# Patient Record
Sex: Female | Born: 1960 | Race: White | Hispanic: No | Marital: Married | Smoking: Former smoker
Health system: Southern US, Community
[De-identification: ages and names within clinical notes are randomized; demographics above are authoritative.]

## PROBLEM LIST (undated history)

## (undated) DIAGNOSIS — E785 Hyperlipidemia, unspecified: Secondary | ICD-10-CM

## (undated) DIAGNOSIS — E559 Vitamin D deficiency, unspecified: Secondary | ICD-10-CM

## (undated) DIAGNOSIS — G4730 Sleep apnea, unspecified: Secondary | ICD-10-CM

## (undated) DIAGNOSIS — E039 Hypothyroidism, unspecified: Secondary | ICD-10-CM

## (undated) DIAGNOSIS — G4733 Obstructive sleep apnea (adult) (pediatric): Secondary | ICD-10-CM

## (undated) DIAGNOSIS — K219 Gastro-esophageal reflux disease without esophagitis: Secondary | ICD-10-CM

## (undated) DIAGNOSIS — F32A Depression, unspecified: Secondary | ICD-10-CM

## (undated) DIAGNOSIS — G4700 Insomnia, unspecified: Secondary | ICD-10-CM

## (undated) DIAGNOSIS — Z9889 Other specified postprocedural states: Secondary | ICD-10-CM

## (undated) DIAGNOSIS — M797 Fibromyalgia: Secondary | ICD-10-CM

## (undated) DIAGNOSIS — T8859XA Other complications of anesthesia, initial encounter: Secondary | ICD-10-CM

## (undated) DIAGNOSIS — J45909 Unspecified asthma, uncomplicated: Secondary | ICD-10-CM

## (undated) DIAGNOSIS — E079 Disorder of thyroid, unspecified: Secondary | ICD-10-CM

## (undated) DIAGNOSIS — I341 Nonrheumatic mitral (valve) prolapse: Secondary | ICD-10-CM

## (undated) DIAGNOSIS — Z8489 Family history of other specified conditions: Secondary | ICD-10-CM

## (undated) DIAGNOSIS — T884XXA Failed or difficult intubation, initial encounter: Secondary | ICD-10-CM

## (undated) DIAGNOSIS — R0902 Hypoxemia: Secondary | ICD-10-CM

## (undated) DIAGNOSIS — R112 Nausea with vomiting, unspecified: Secondary | ICD-10-CM

## (undated) DIAGNOSIS — T4145XA Adverse effect of unspecified anesthetic, initial encounter: Secondary | ICD-10-CM

## (undated) DIAGNOSIS — M1990 Unspecified osteoarthritis, unspecified site: Secondary | ICD-10-CM

## (undated) DIAGNOSIS — G43909 Migraine, unspecified, not intractable, without status migrainosus: Secondary | ICD-10-CM

## (undated) DIAGNOSIS — I10 Essential (primary) hypertension: Secondary | ICD-10-CM

## (undated) DIAGNOSIS — F419 Anxiety disorder, unspecified: Secondary | ICD-10-CM

## (undated) HISTORY — DX: Essential (primary) hypertension: I10

## (undated) HISTORY — PX: TMJ ARTHROPLASTY: SHX1066

## (undated) HISTORY — PX: CHOLECYSTECTOMY: SHX55

## (undated) HISTORY — DX: Nonrheumatic mitral (valve) prolapse: I34.1

## (undated) HISTORY — PX: ABDOMINAL HYSTERECTOMY: SHX81

## (undated) HISTORY — PX: CARPAL TUNNEL RELEASE: SHX101

## (undated) HISTORY — DX: Unspecified asthma, uncomplicated: J45.909

## (undated) HISTORY — DX: Disorder of thyroid, unspecified: E07.9

## (undated) HISTORY — DX: Insomnia, unspecified: G47.00

## (undated) HISTORY — DX: Vitamin D deficiency, unspecified: E55.9

## (undated) HISTORY — DX: Unspecified osteoarthritis, unspecified site: M19.90

## (undated) HISTORY — DX: Depression, unspecified: F32.A

## (undated) HISTORY — PX: RECTOCELE REPAIR: SHX761

## (undated) HISTORY — DX: Migraine, unspecified, not intractable, without status migrainosus: G43.909

## (undated) HISTORY — DX: Obstructive sleep apnea (adult) (pediatric): G47.33

## (undated) HISTORY — DX: Hypoxemia: R09.02

## (undated) HISTORY — PX: TUBAL LIGATION: SHX77

## (undated) HISTORY — DX: Sleep apnea, unspecified: G47.30

## (undated) HISTORY — PX: VESICOVAGINAL FISTULA CLOSURE W/ TAH: SUR271

## (undated) HISTORY — DX: Fibromyalgia: M79.7

## (undated) HISTORY — DX: Anxiety disorder, unspecified: F41.9

## (undated) HISTORY — PX: NASAL SEPTUM SURGERY: SHX37

## (undated) HISTORY — DX: Hyperlipidemia, unspecified: E78.5

## (undated) HISTORY — PX: HEMORRHOIDECTOMY WITH HEMORRHOID BANDING: SHX5633

## (undated) HISTORY — DX: Failed or difficult intubation, initial encounter: T88.4XXA

---

## 1997-12-25 ENCOUNTER — Ambulatory Visit (HOSPITAL_COMMUNITY): Admission: RE | Admit: 1997-12-25 | Discharge: 1997-12-25 | Payer: Self-pay | Admitting: Obstetrics & Gynecology

## 1998-10-21 ENCOUNTER — Other Ambulatory Visit: Admission: RE | Admit: 1998-10-21 | Discharge: 1998-10-21 | Payer: Self-pay | Admitting: Obstetrics & Gynecology

## 1998-11-08 ENCOUNTER — Ambulatory Visit (HOSPITAL_COMMUNITY): Admission: RE | Admit: 1998-11-08 | Discharge: 1998-11-08 | Payer: Self-pay | Admitting: Obstetrics & Gynecology

## 1999-01-10 ENCOUNTER — Ambulatory Visit (HOSPITAL_COMMUNITY): Admission: RE | Admit: 1999-01-10 | Discharge: 1999-01-10 | Payer: Self-pay | Admitting: Obstetrics & Gynecology

## 1999-01-10 ENCOUNTER — Encounter: Payer: Self-pay | Admitting: Obstetrics & Gynecology

## 1999-12-23 ENCOUNTER — Other Ambulatory Visit: Admission: RE | Admit: 1999-12-23 | Discharge: 1999-12-23 | Payer: Self-pay | Admitting: Obstetrics & Gynecology

## 2000-01-12 ENCOUNTER — Other Ambulatory Visit: Admission: RE | Admit: 2000-01-12 | Discharge: 2000-01-12 | Payer: Self-pay | Admitting: Obstetrics & Gynecology

## 2000-05-20 ENCOUNTER — Encounter (INDEPENDENT_AMBULATORY_CARE_PROVIDER_SITE_OTHER): Payer: Self-pay

## 2000-05-20 ENCOUNTER — Inpatient Hospital Stay (HOSPITAL_COMMUNITY): Admission: RE | Admit: 2000-05-20 | Discharge: 2000-05-22 | Payer: Self-pay | Admitting: Obstetrics & Gynecology

## 2000-11-03 ENCOUNTER — Encounter: Payer: Self-pay | Admitting: Family Medicine

## 2000-11-03 ENCOUNTER — Ambulatory Visit (HOSPITAL_COMMUNITY): Admission: RE | Admit: 2000-11-03 | Discharge: 2000-11-03 | Payer: Self-pay | Admitting: Family Medicine

## 2001-08-23 ENCOUNTER — Encounter: Payer: Self-pay | Admitting: Emergency Medicine

## 2001-08-23 ENCOUNTER — Emergency Department (HOSPITAL_COMMUNITY): Admission: AC | Admit: 2001-08-23 | Discharge: 2001-08-23 | Payer: Self-pay

## 2001-12-30 ENCOUNTER — Ambulatory Visit (HOSPITAL_BASED_OUTPATIENT_CLINIC_OR_DEPARTMENT_OTHER): Admission: RE | Admit: 2001-12-30 | Discharge: 2001-12-30 | Payer: Self-pay | Admitting: Family Medicine

## 2002-10-03 ENCOUNTER — Encounter: Admission: RE | Admit: 2002-10-03 | Discharge: 2002-10-03 | Payer: Self-pay | Admitting: Gastroenterology

## 2002-10-03 ENCOUNTER — Encounter: Payer: Self-pay | Admitting: Gastroenterology

## 2002-11-13 ENCOUNTER — Ambulatory Visit (HOSPITAL_COMMUNITY): Admission: RE | Admit: 2002-11-13 | Discharge: 2002-11-13 | Payer: Self-pay | Admitting: Gastroenterology

## 2002-11-13 ENCOUNTER — Encounter: Payer: Self-pay | Admitting: Gastroenterology

## 2002-12-20 ENCOUNTER — Ambulatory Visit (HOSPITAL_COMMUNITY): Admission: RE | Admit: 2002-12-20 | Discharge: 2002-12-20 | Payer: Self-pay | Admitting: Gastroenterology

## 2003-05-21 ENCOUNTER — Other Ambulatory Visit: Admission: RE | Admit: 2003-05-21 | Discharge: 2003-05-21 | Payer: Self-pay | Admitting: Obstetrics and Gynecology

## 2004-10-13 ENCOUNTER — Other Ambulatory Visit: Admission: RE | Admit: 2004-10-13 | Discharge: 2004-10-13 | Payer: Self-pay | Admitting: Family Medicine

## 2005-05-01 ENCOUNTER — Observation Stay (HOSPITAL_COMMUNITY): Admission: RE | Admit: 2005-05-01 | Discharge: 2005-05-02 | Payer: Self-pay | Admitting: *Deleted

## 2005-05-01 ENCOUNTER — Encounter (INDEPENDENT_AMBULATORY_CARE_PROVIDER_SITE_OTHER): Payer: Self-pay | Admitting: Specialist

## 2005-09-23 ENCOUNTER — Other Ambulatory Visit: Admission: RE | Admit: 2005-09-23 | Discharge: 2005-09-23 | Payer: Self-pay | Admitting: Obstetrics and Gynecology

## 2005-10-19 ENCOUNTER — Inpatient Hospital Stay (HOSPITAL_COMMUNITY): Admission: RE | Admit: 2005-10-19 | Discharge: 2005-10-21 | Payer: Self-pay | Admitting: Obstetrics and Gynecology

## 2005-10-21 ENCOUNTER — Inpatient Hospital Stay (HOSPITAL_COMMUNITY): Admission: AD | Admit: 2005-10-21 | Discharge: 2005-10-21 | Payer: Self-pay | Admitting: Obstetrics and Gynecology

## 2006-02-19 ENCOUNTER — Encounter: Admission: RE | Admit: 2006-02-19 | Discharge: 2006-02-19 | Payer: Self-pay | Admitting: Family Medicine

## 2008-01-18 ENCOUNTER — Encounter: Admission: RE | Admit: 2008-01-18 | Discharge: 2008-01-18 | Payer: Self-pay | Admitting: Family Medicine

## 2008-01-26 ENCOUNTER — Encounter: Admission: RE | Admit: 2008-01-26 | Discharge: 2008-01-26 | Payer: Self-pay | Admitting: Family Medicine

## 2008-11-27 IMAGING — US US CAROTID DUPLEX BILAT
1 series · 13 of 24 positions shown · non-contrast
Comparison: none

CLINICAL DATA: 46 year old with carotid arterial plaque seen on thyroid ultrasound.
BILATERAL CAROTID DUPLEX ULTRASOUND:

[Series 1: us carotid duplex bilat · 0.08mm/px · 13 of 72 slices shown]
[im 1/72]
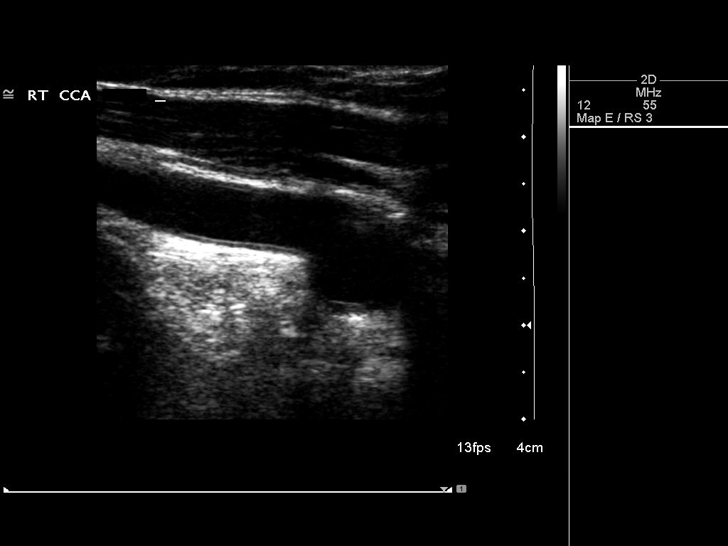
[im 7/72]
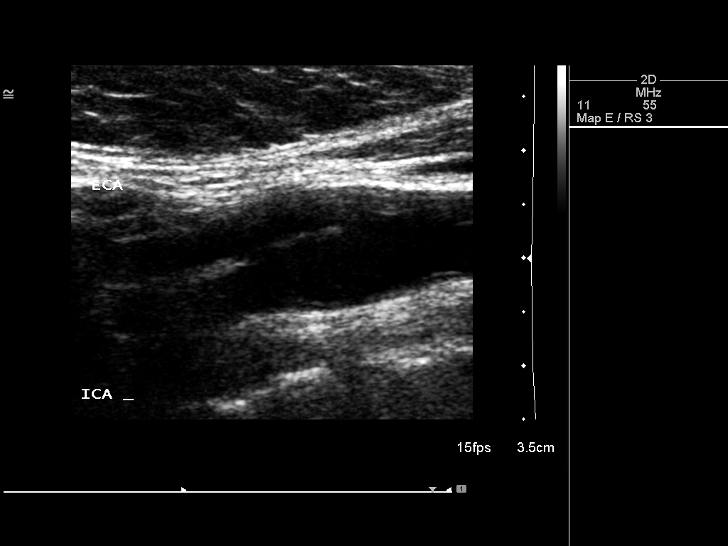
[im 13/72]
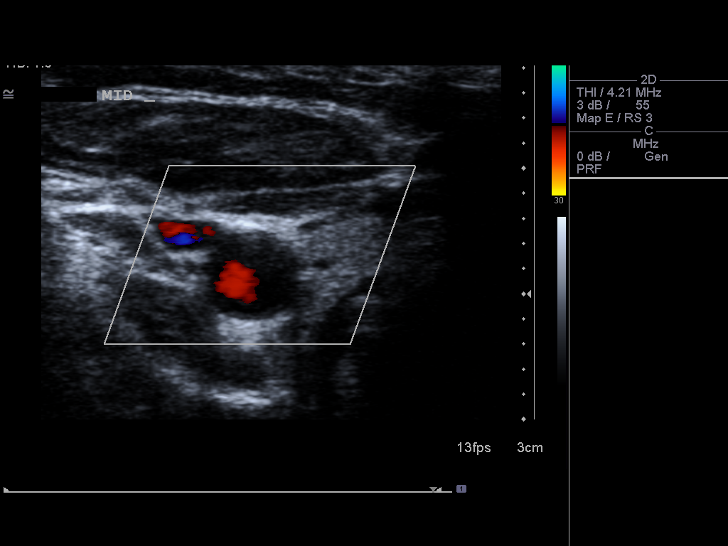
[im 19/72]
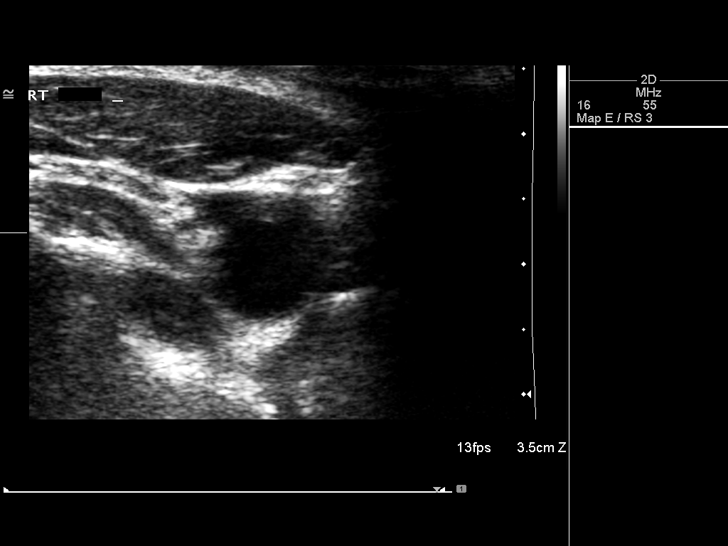
[im 25/72]
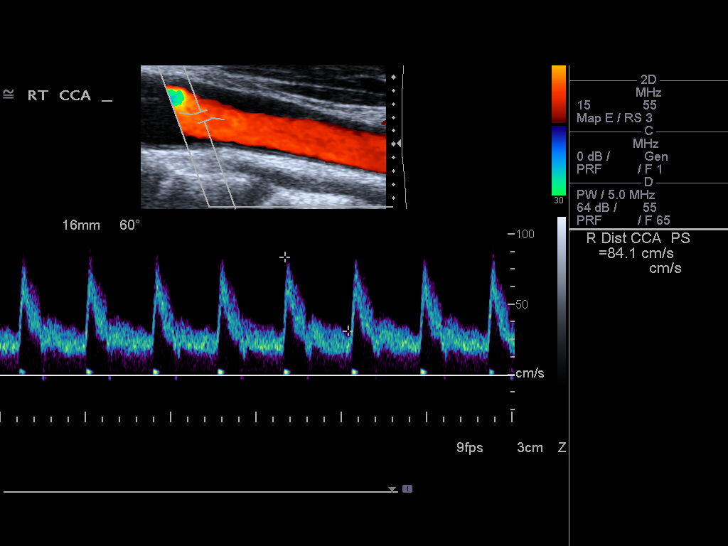
[im 31/72]
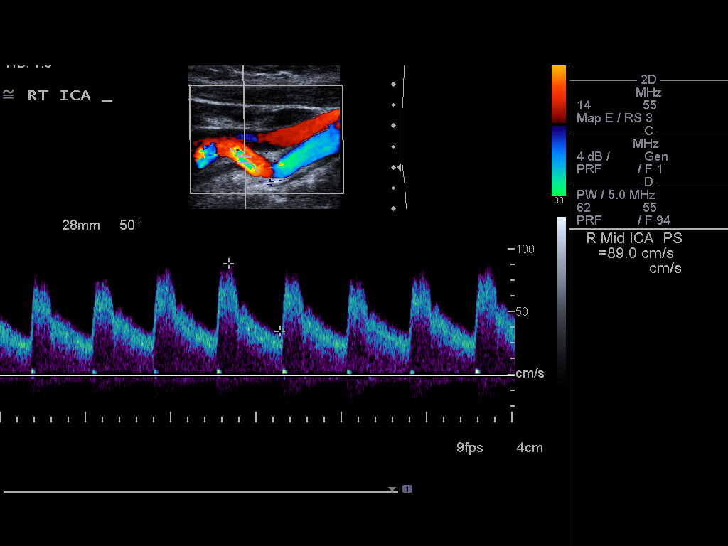
[im 38/72]
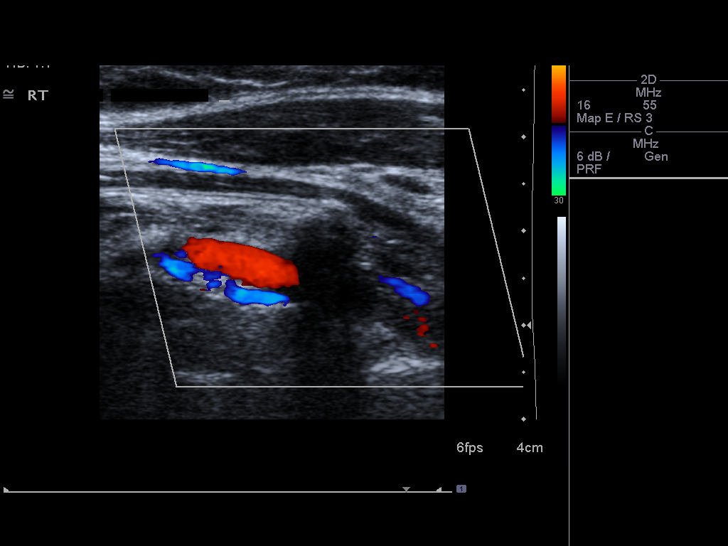
[im 41/72]
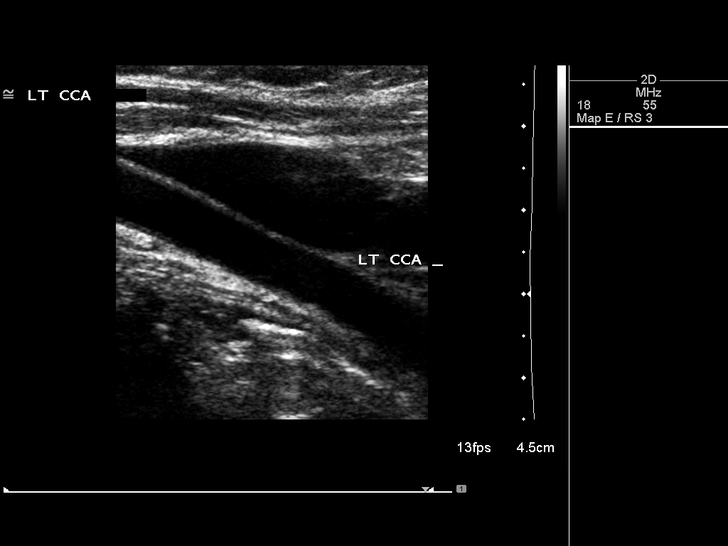
[im 47/72]
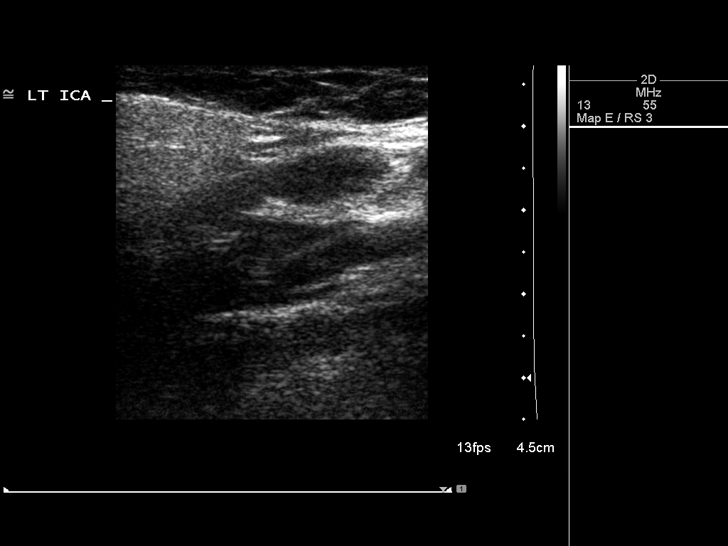
[im 53/72]
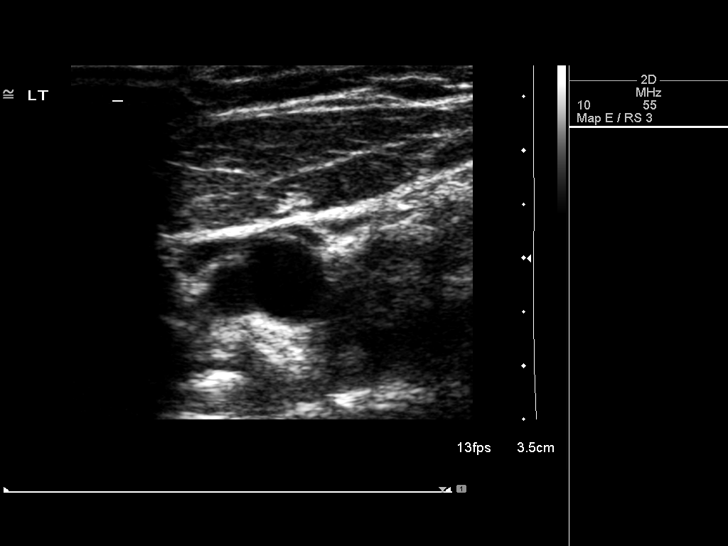
[im 59/72]
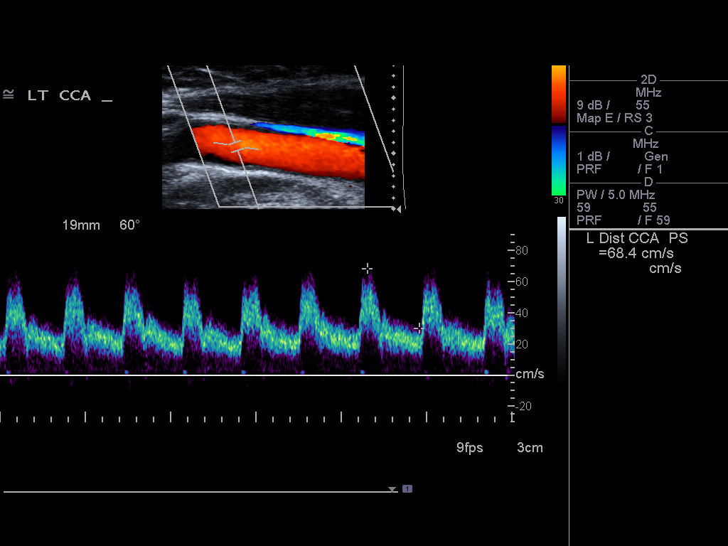
[im 65/72]
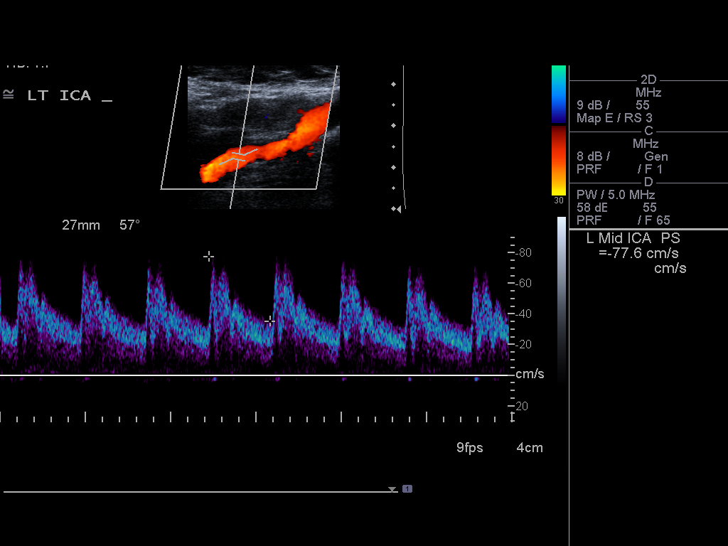
[im 72/72]
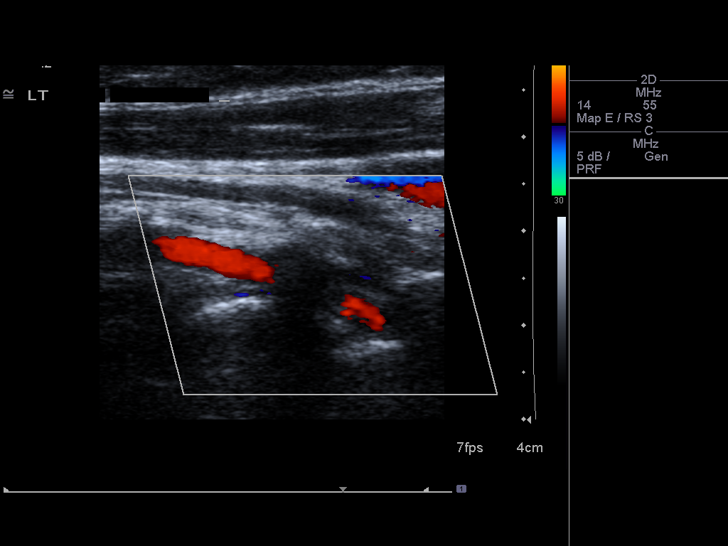

[13 of 24 positions shown; findings below may reference images not displayed]

FINDINGS: The gray scale images and color flow images demonstrate peripheral thrombus involving the distal right common carotid artery.  The lumen of the vessel appears narrowed.  Despite the peripheral thrombus, there does not appear to be a significant elevation in the velocity of the right common carotid artery.  The right external and internal carotid arteries are patent.  The peak systolic velocity in the right internal carotid artery is 89 cm per second.  The internal carotid artery is tortuous.  Antegrade flow involving the right vertebral artery.  The cross-sectional images of the right distal common carotid artery with color flow suggests the stenosis could measure up to 50%. 
The left common, external and internal carotid arteries are patent.  Peak systolic velocity in the left internal carotid artery is 89 cm per second.  Antegrade flow in the left vertebral artery.  
The following Doppler flow velocity measurements were obtained (in cm/sec):

SITE:  PEAK SYSTOLIC  END-DIASTOLIC

RIGHT  ICA:  89  35
RIGHT CCA:  91  25
RIGHT ICA/CCA RATIO:
RIGHT ECA:  181
LEFT ICA:  89  38
LEFT CCA:  86  24 
LEFT ICA/CCA RATIO:
LEFT ECA:  91
Criteria:  Quantification of carotid stenosis is based on velocity parameters that correlate the residual internal carotid diameter with NASCET-based stenosis levels.
IMPRESSION: 1.  No critical stenosis involving the internal carotid arteries.  There is prominent plaque involving the distal common carotid artery, but this does not appear to be hemodynamically significant.  
2.  Stenosis near the origin of the right external carotid artery.  Peak systolic velocity in this area measures 181 cm per second.

## 2009-01-05 ENCOUNTER — Emergency Department (HOSPITAL_COMMUNITY): Admission: EM | Admit: 2009-01-05 | Discharge: 2009-01-05 | Payer: Self-pay | Admitting: Emergency Medicine

## 2010-11-23 HISTORY — PX: OTHER SURGICAL HISTORY: SHX169

## 2011-03-10 LAB — DIFFERENTIAL
Lymphocytes Relative: 17 % (ref 12–46)
Lymphs Abs: 1.7 10*3/uL (ref 0.7–4.0)
Monocytes Relative: 4 % (ref 3–12)
Neutrophils Relative %: 77 % (ref 43–77)

## 2011-03-10 LAB — URINALYSIS, ROUTINE W REFLEX MICROSCOPIC
Bilirubin Urine: NEGATIVE
Ketones, ur: NEGATIVE mg/dL
Protein, ur: NEGATIVE mg/dL
Urobilinogen, UA: 0.2 mg/dL (ref 0.0–1.0)

## 2011-03-10 LAB — COMPREHENSIVE METABOLIC PANEL
AST: 19 U/L (ref 0–37)
CO2: 27 mEq/L (ref 19–32)
Calcium: 9.5 mg/dL (ref 8.4–10.5)
Creatinine, Ser: 0.77 mg/dL (ref 0.4–1.2)
GFR calc Af Amer: >60 mL/min (ref >60)
GFR calc non Af Amer: >60 mL/min (ref >60)
Glucose, Bld: 129 mg/dL — ABNORMAL HIGH (ref 70–99)
Sodium: 139 mEq/L (ref 135–145)
Total Protein: 7.1 g/dL (ref 6.0–8.3)

## 2011-03-10 LAB — CBC
MCHC: 34.8 g/dL (ref 30.0–36.0)
MCV: 87.3 fL (ref 78.0–100.0)
RBC: 4.71 MIL/uL (ref 3.87–5.11)
RDW: 13.2 % (ref 11.5–15.5)

## 2011-03-10 LAB — POCT CARDIAC MARKERS
CKMB, poc: <1 ng/mL — ABNORMAL LOW (ref 1.0–8.0)
Myoglobin, poc: 59.6 ng/mL (ref 12–200)
Troponin i, poc: <0.05 ng/mL (ref 0.00–0.09)

## 2011-03-10 LAB — URINE MICROSCOPIC-ADD ON

## 2011-03-10 LAB — LIPASE, BLOOD: Lipase: 21 U/L (ref 11–59)

## 2011-03-10 LAB — D-DIMER, QUANTITATIVE: D-Dimer, Quant: 0.35 ug/mL-FEU (ref 0.00–0.48)

## 2011-04-10 NOTE — Op Note (Signed)
NAMEINELLA, KUWAHARA            ACCOUNT NO.:  [id number]   MEDICAL RECORD NO.:  [id number]          PATIENT TYPE:  AMB   LOCATION:  DAY                          FACILITY:  [facility]   PHYSICIAN:  Guy Sandifer. Henderson Cloud, M.D. DATE OF BIRTH:  1961-02-20   DATE OF PROCEDURE:  05/01/2005  DATE OF DISCHARGE:                                 OPERATIVE REPORT   PREOPERATIVE DIAGNOSIS:  Pelvic pain.   POSTOPERATIVE DIAGNOSIS:  Pelvic adhesions.   PROCEDURE:  Open laparoscopy with lysis of adhesions.   SURGEON:  Harold Hedge, M.D.   ANESTHESIA:  General endotracheal anesthesia.   ESTIMATED BLOOD LOSS:  Drops.   INDICATIONS AND CONSENT:  This patient is a 50 year old married white female  with a history of endometriosis, status post total vaginal hysterectomy with  recurrent pelvic pain.  Laparoscopy has been discussed with the patient  preoperatively.  Potential risks and complications have been reviewed  preoperatively including but not limited to infection, bowel, bladder  ureteral damage, bleeding requiring transfusion of blood products with  possible transfusion reaction, HIV and hepatitis acquisition, DVT, PE and  pneumonia and recurrent pain.  All questions have been answered and consent  was found on the chart.   FINDINGS:  The ovaries are normal bilaterally.  There are multiple filmy  adhesions from the epiploica of the sigmoid to the vaginal cuff.  There are  some filmy adhesions in the vesicouterine peritoneum as well.   PROCEDURE:  The patient was taken to the operating room, where she was  identified, placed in the dorsal supine position and general anesthesia was  induced via endotracheal intubation.  She was then prepped abdominally,  bladder was straight catheterized and she was draped in the dorsal supine  position.  An infraumbilical incision was made and dissection was carried  out in layers to the fascia.  The fascia was grasped and incised.  Sutures  of 0 Vicryl were  placed at each angle.  Dissection was carried on to the  peritoneum without difficulty.  The disposable Hasson trocar sleeve was then  closed and anchored down.  Pneumoperitoneum was induced.  A small suprapubic  incision was made and a 5 mm disposable nonbladed trocar sleeve was placed  under direct visualization without difficulty.  The above findings are  noted.  These were taken down sharply.  Bipolar cautery was used for  hemostasis.  This was all done well clear of the bowel.  Good hemostasis was  noted.  At this point, Dr. Luan Pulling enters the room and begins her  procedure dictated under a separate note.  At this point, the patient is  stable and all counts are correct.     JET/MEDQ  D:  05/01/2005  T:  05/01/2005  Job:  [id number]
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
## 2011-04-10 NOTE — Op Note (Signed)
Kayla Sanders, SHORB NO.:  [id number]   MEDICAL RECORD NO.:  [id number]          PATIENT TYPE:  INP   LOCATION:  9399                          FACILITY:  [facility]   PHYSICIAN:  Bertram Millard. Dahlstedt, M.D.DATE OF BIRTH:  1961-03-21   DATE OF PROCEDURE:  10/19/2005  DATE OF DISCHARGE:                                 OPERATIVE REPORT   PREOPERATIVE DIAGNOSIS:  Stress urinary incontinence.   POSTOPERATIVE DIAGNOSIS:  Stress urinary incontinence.   PRINCIPAL PROCEDURE:  Placement of Lynx suprapubic sling.   SURGEON:  Bertram Millard. Dahlstedt, M.D.   FIRST ASSISTANT:  Guy Sandifer. Henderson Cloud, M.D.   ANESTHESIA:  Subarachnoid block with sedation.   ESTIMATED BLOOD LOSS:  300 mL.   BRIEF HISTORY:  This 50 year old female presents at this time for anterior  and posterior repair and a [place] suprapubic sling.  She initially saw me in  February of this year.  She had significant stress urinary incontinence.  She has no significant urgency incontinence but has mild symptoms of an  overactive bladder.   She is status post hemorrhoidectomy and cholecystectomy by Dr. Luan Pulling.  As the patient has significant incontinence and pelvic prolapse, she is  scheduled to have anterior and posterior repair and her SPARC sling.  She is  aware of the risks and complications of the sling, including but not limited  to bleeding, infection, erosion/extrusion of the sling, retention of  persistent incontinence.   DESCRIPTION OF PROCEDURE:  The patient had been administered a subarachnoid  block and placed in the dorsal lithotomy position.  Genitalia and perineum  as well as lower abdomen were prepped and draped.  Dr. Henderson Cloud commenced  with opening the anterior vaginal wall for the anterior repair.  The urethra  was nicely exposed underneath.  The bladder was catheterized.  I then made  two separate stab incisions on each side of the midline in the suprapubic  area just superior to the pubic bone.   I then passed the suprapubic needles  from these small stab incisions down through the space of Retzius on either  side of the urethra and out through the vaginal incision.  The [place] sling  was grasped and brought through the suprapubic space.  Prior to this,  cystoscopic evaluation of the bladder revealed no puncture or injury.  The  sheath was cut from the sling and the sling was positioned underneath the  urethra such that a ring angle would fit between the urethra and the sling;  in other words, it was quite slack.  The sling material was then cut just  underneath the skin edges bilaterally and allowed to fall back into the  subcutaneous tissue.  There was a fair amount of bleeding through the right  puncture site and anterior vaginal fascia.  I attempted to put a 4-0 Vicryl  in this in a figure-of-eight fashion.  This failed to stop the oozing.  I  then placed a small piece of  Surgicel into the puncture site.  In this manner, the bleeding then stopped.  The catheter was then replaced in the bladder.  Dr. Henderson Cloud  then proceeded  with the rest of the anterior and posterior repair.  That will be dictated  separately.      Bertram Millard. Dahlstedt, M.D.  Electronically Signed     SMD/MEDQ  D:  10/19/2005  T:  10/19/2005  Job:  [id number]   cc:   Guy Sandifer. Henderson Cloud, M.D.  Fax: [phone]

## 2011-04-10 NOTE — Op Note (Signed)
Kayla Sanders, Kayla Sanders            ACCOUNT NO.:  [id number]   MEDICAL RECORD NO.:  [id number]          PATIENT TYPE:  INP   LOCATION:  9399                          FACILITY:  [facility]   PHYSICIAN:  Guy Sandifer. Henderson Cloud, M.D. DATE OF BIRTH:  08/25/1961   DATE OF PROCEDURE:  10/19/2005  DATE OF DISCHARGE:                                 OPERATIVE REPORT   PREOPERATIVE DIAGNOSIS:  Cystocele, rectocele.   POSTOPERATIVE DIAGNOSIS:  Cystocele, rectocele.   PROCEDURE:  Anterior posterior vaginal repair.   SURGEON:  Guy Sandifer. Henderson Cloud, M.D.   ASSISTANT:  Zelphia Cairo, M.D.   ANESTHESIA:  Spinal Raul Del, M.D.   ESTIMATED BLOOD LOSS:  500 mL (total for the anterior posterior repair and  Spark sling)   INDICATIONS:  The patient is a 50 year old married white female gravida 4,  para 3 status post total vaginal hysterectomy with a symptomatic cystocele,  rectocele. She also has symptomatic stress urinary continence. Anterior  posterior vaginal repairs have been discussed with the patient. The patient  has been evaluated for stress urinary continence by Bertram Millard. Dahlstedt,  M.D. who plans a mid urethral sling. Anterior and posterior vaginal repair  was discussed with the patient including but not limited to infection,  bowel, bladder, ureteral damage, bleeding requiring transfusion of blood  products with possible transfusion reaction, HIV and hepatitis acquisition,  DVT, PE, pneumonia, fistula formation, vaginal narrowing requiring dilators,  postoperative dyspareunia and recurrent pelvic relaxation. All questions  were answered and consent signed on the chart.   PROCEDURE:  The patient is taken to operating room where she is identified,  spinal anesthetic is placed and she is placed in dorsal lithotomy position.  She is then prepped. Bladder straight catheterized and she is draped in  sterile fashion. Weighted speculum was placed. Apex the vagina was grasped  on either side with  Allis clamps as well as in the midline. The submucosa is  then infiltrated with one-quarter percent Marcaine with 1:200,000  epinephrine. The T-shaped incision was then made and the mucosa was taken  down in the midline and widely dissected. Then using 0 Monocryl suture  pubocervical vesicle fascia was then reapproximated in the midline. Dr.  Retta Diones enters the room and performed a Spark sling. This was dictated  under separate note. During this procedure to control bleeding he placed  some Surgicel at one vaginal exit point of the sling. Excess vaginal mucosa  was trimmed. A 0 Monocryl was used to reapproximate the superior aspect of  the mucosa. Remainder was then closed in running fashion with 4-0 locking  Monocryl suture. The posterior repair was then carried out. Posterior mucosa  was infiltrated with the same solution. Diamond shape wedge of tissue was  sharply resected from the poster perineal body and the mucosa was taken down  in the midline and widely dissected. 0 Monocryl suture was then used to  reapproximate the rectovaginal fascia. This gives good support posteriorly.  Excess mucosa was trimmed and a 2-0 locking running Monocryl suture was used  to close the mucosa down to the level of the perineal body. Perineal  body  was dissected out. It is then reapproximated with interrupted 0 Monocryl.  The same 2-0 running Monocryl suture was then continued to close  the skin in a standard episiotomy type fashion. The vagina was then packed  with 1-inch gauze with estrogen cream. Foley catheter is put to straight  drain. All counts are correct. The patient is awakened and taken to recovery  room in stable condition.      Guy Sandifer Henderson Cloud, M.D.  Electronically Signed     JET/MEDQ  D:  10/19/2005  T:  10/19/2005  Job:  [id number]

## 2011-04-10 NOTE — Op Note (Signed)
[facility]  Patient:    Kayla Sanders, Kayla Sanders                   MRN: [id number] Proc. Date: 05/20/00 Adm. Date:  [id number] Attending:  Cleatrice Burke                           Operative Report  DATE OF BIRTH:                1960-11-28  PREOPERATIVE DIAGNOSES:       1. Menometrorrhagia.                               2. Endometriosis.  POSTOPERATIVE DIAGNOSES:      1. Menometrorrhagia.                               2. Endometriosis.  PROCEDURE:                    Transvaginal hysterectomy.  SURGEON:                      Cecilio Asper, M.D.  ASSISTANT:                    Henreitta Leber, P.A.  ANESTHESIA:                   Spinal.  ESTIMATED BLOOD LOSS:         300 cc.  URINE OUTPUT:                 350 cc.  FINDINGS:                     Small uterus.  Normal tubes and ovaries.  COMPLICATIONS:                None.  HISTORY:                      The patient is a 50 year old multigravid patient status post bilateral tubal ligation who has a known history of endometriosis. The patient continues to have difficulties with her periods.  They can last up to 18 days and occur as frequently as every 1-2 weeks.  The patient desires definitive management in the form of vaginal hysterectomy.  She has, therefore, consented.  DESCRIPTION OF PROCEDURE:     After an adequate level of spinal anesthesia was obtained, the patient was prepped and draped in a sterile fashion.  A weighted speculum was placed inside the vagina.  A Jacobs tenaculum was placed on the cervix.  Then, 10 cc of a dilute Pitressin solution was circumferentially injected into the cervix.  A circumferential incision was made with the scalpel The posterior cul-de-sac was entered by sharp and blunt dissection. The uterosacral ligaments were cross clamped, ligated, suture ligated and tagged.  The uterine arteries were then progressively cross clamped, cut and suture  ligated.  The anterior peritoneum was entered by sharp dissection.  The fundus of the uterus was grasped and delivered.  The uteroovarian pedicles were then cross clamped, cut and made hemostatic with a free tie and then a suture ligature.  Angle sutures were placed below the 3 and 9 oclock positions, taking care to incorporate the  uterosacral ligaments.  McCall culdoplasty suture was placed, also incorporating the uterosacral ligaments. There was a small bleeder of the left portion of the cuff that was made hemostatic with a figure-of-eight 0 Vicryl.  Hemostasis was then assured.  The cuff was then closed in anterior to posterior fashion, taking care to incorporate the peritoneum.  The culdoplasty suture was then tied in the midline.  The uterosacrals were then tied in the midline.  Foley was placed. The patient tolerated the procedure well.  Sponge, needle and instrument counts were correct x 2.  The patient was taken to the recovery room in stable condition. DD:  05/20/00 TD:  05/21/00 Job: [id number]

## 2011-04-10 NOTE — Op Note (Signed)
   NAME:  Kayla Sanders, Kayla Sanders                      ACCOUNT NO.:  [id number]   MEDICAL RECORD NO.:  [id number]                   PATIENT TYPE:  AMB   LOCATION:  ENDO                                 FACILITY:  [facility]   PHYSICIAN:  Anselmo Rod, M.D.               DATE OF BIRTH:  December 29, 1960   DATE OF PROCEDURE:  12/20/2002  DATE OF DISCHARGE:                                 OPERATIVE REPORT   PROCEDURE:  Diagnostic esophagogastroduodenoscopy.   ENDOSCOPIST:  Anselmo Rod, M.D.   INSTRUMENT USED:  Olympus video panendoscope.   INDICATION FOR PROCEDURE:  A 51 year old white female undergoing EGD for  epigastric pain not responding to PPIs.  Rule out peptic ulcer disease,  esophagitis, gastritis, etc.   PREPROCEDURE PREPARATION:  Informed consent was procured from the patient.  The patient was fasted for eight hours prior to the procedure and was given  400 mg of Cipro IV for MVP prophylaxis prior to the procedure.   PREPROCEDURE PHYSICAL:  VITAL SIGNS:  The patient had stable vital signs.  NECK:  Supple.  CHEST:  Clear to auscultation.  S1, S2 regular.  ABDOMEN:  Soft with normal bowel sounds.   DESCRIPTION OF PROCEDURE:  The patient was placed in the left lateral  decubitus position and sedated with 50 mg of Demerol and 5 mg of Versed  intravenously.  After the patient was adequately sedate and maintained on  low-flow oxygen and continuous cardiac monitoring, the Olympus video  panendoscope was advanced through the mouthpiece, over the tongue, into the  esophagus under direct vision.  The entire esophagus appeared normal with no  evidence of ring, stricture, masses, esophagitis, or Barrett's mucosa.  The  scope was then advanced into the stomach.  The entire gastric mucosa and the  proximal small bowel appeared normal.  Retroflexion revealed no acute  abnormalities.   IMPRESSION:  Normal EGD.   RECOMMENDATIONS:  1. Try changing PPIs and monitor response.  2.     Adhere to  antireflux measures.  3. Avoid all nonsteroidals, including aspirin.  4. Outpatient follow-up in the next two weeks for further recommendations.                                               Anselmo Rod, M.D.    JNM/MEDQ  D:  12/20/2002  T:  12/20/2002  Job:  [id number]   cc:   Talmadge Coventry, M.D.  [address]  [place]  [place] [address]  Fax: [phone]

## 2011-04-10 NOTE — Op Note (Signed)
[name], Kayla Sanders NO.:  [id number]   MEDICAL RECORD NO.:  [id number]          PATIENT TYPE:  OBV   LOCATION:  1616                         [facility]   PHYSICIAN:  Vikki Ports, MDDATE OF BIRTH:  11-01-1961   DATE OF PROCEDURE:  05/01/2005  DATE OF DISCHARGE:                                 OPERATIVE REPORT   PREOPERATIVE DIAGNOSES:  1.  Biliary dyskinesia.  2.  Symptomatic grade 2 internal hemorrhoids.   POSTOPERATIVE DIAGNOSES:  1.  Biliary dyskinesia.  2.  Symptomatic grade 2 internal hemorrhoids.   PROCEDURE:  1.  Laparoscopic cholecystectomy.  2.  Procedure for prolapsing hemorrhoids rectopexy.   SURGEON:  Vikki Ports, MD.   ASSISTANT:  Gita Kudo, M.D.   DESCRIPTION OF PROCEDURE:  The patient was taken to the operating room,  placed in the supine position. After adequate anesthesia was induced using  endotracheal tube, Dr. Henderson Cloud performed diagnostic laparoscopy and lysis of  adhesions. From previously placed Hasson trocar, pneumoperitoneum had been  obtained and additional 11 and 5 mm trocars were placed and the patient was  placed in reverse Trendelenburg position. The gallbladder was identified and  retracted cephalad. The infundibulum was identified, the neck of the  gallbladder was dissected, the window behind the cystic duct was identified  and it was clipped on the gallbladder. Ductotomy was made, it was a very  obliterated small duct certainly less than 1 mm and would not allow the tip  of a hook cautery dissector. For this reason, cholangiogram could not be  performed. I was satisfied with the anatomy, triply clipped and divided the  structure which was the cystic duct. The cystic artery was identified, a  window was created posterior to it, it was triply clipped and divided,  gallbladder was taken off the gallbladder bed using Bovie electrocautery and  removed through the umbilical port. Adequate  hemostasis was ensured.  Pneumoperitoneum was released. The infraumbilical fascial defect was closed  with Vicryl sutures. The skin incisions were closed with subcuticular 4-0  Monocryl, Steri-Strips, sterile dressings were applied.   The patient was then placed in a prone jackknife position, rectal and  perianal prep were undertaken. Anal dilatation was accomplished and the  hemorrhoidal bundles were injected with Marcaine and Wydase. Internal and  external sphincter muscles were injected with additional 0.5 Marcaine. A  running submucosal pursestring suture was placed in the rectum approximately  5 cm proximal to the dentate line. PPH stapling device was placed and the  pursestring suture was tied down. It was closed, clamped and fired after  being held for 45 seconds. It was removed, there was a  good amount of hemorrhoidal tissue with no evidence of musculature. The  staple line was inspected, there was no bleeding. Gelfoam packing was  placed. The patient tolerated the procedure well and went to PACU in good  condition.       KRH/MEDQ  D:  05/01/2005  T:  05/01/2005  Job:  [id number]

## 2011-04-10 NOTE — Discharge Summary (Signed)
Kayla Sanders, MOSKAL            ACCOUNT NO.:  [id number]   MEDICAL RECORD NO.:  [id number]          PATIENT TYPE:  INP   LOCATION:  9305                          FACILITY:  [facility]   PHYSICIAN:  Guy Sandifer. Henderson Cloud, M.D. DATE OF BIRTH:  [birth year]   DATE OF ADMISSION:  10/19/2005  DATE OF DISCHARGE:  10/21/2005                                 DISCHARGE SUMMARY   REASON FOR ADMISSION:  Symptomatic cystocele, rectocele and stress urinary  continence.   DISCHARGE DIAGNOSES:  Symptomatic cystocele, rectocele and stress urinary  continence.   PROCEDURE:  On October 19, 2005, anterior/posterior vaginal repair and a  [facility] midurethral sling per Dr. Retta Diones.   REASON FOR ADMISSION:  This patient is a 50 year old married white female G4  P3 status post total vaginal hysterectomy who now has a symptomatic  cystocele, rectocele and stress urinary continence. Details are dictated in  the history and physical. She is admitted for surgical management.   HOSPITAL COURSE:  The patient is admitted to the hospital, undergoes the  above procedures. Estimated blood loss for both procedures is approximately  500 mL. On the evening of surgery she has good pain control, although she  does have some mild nausea. Vital signs are stable. She is afebrile with  clear urine output. The following day she is ambulating, voiding, tolerating  a regular diet. Vital signs are stable, she is afebrile. Hemoglobin is 9.3.  She is discharged the following day where she is noted to be voiding well,  ambulating well, vital signs remain stable, and she is afebrile.   CONDITION ON DISCHARGE:  Good.   DIET:  Regular as tolerated.   ACTIVITY:  No lifting, no operation of automobiles, no vaginal entry.   She is to call the office for problems including but not limited to heavy  vaginal bleeding, persistent nausea/vomiting, increasing pain or temperature  of 101 degrees.   MEDICATIONS:  1.  Percocet 5/325 mg #40 one to  two p.o. q.6h. p.r.n.  2.  Multivitamin with iron daily.  3.  Stool softeners, fluids encouraged, as needed.   Follow-up is in the office in 2 weeks.      Guy Sandifer Henderson Cloud, M.D.  Electronically Signed     JET/MEDQ  D:  12/28/2005  T:  12/28/2005  Job:  [id number]

## 2011-04-10 NOTE — Discharge Summary (Signed)
[place]  Patient:    Kayla Sanders, Kayla Sanders                   MRN: [id number] Adm. Date:  [id number] Disc. Date: [id number] Attending:  Cleatrice Burke                           Discharge Summary  ADMITTING DIAGNOSES: 1. Menorrhagia. 2. Metrorrhagia. 3. Endometriosis.  DISCHARGE DIAGNOSES: 1. Menorrhagia. 2. Metrorrhagia. 3. Endometriosis. 4. Total vaginal hysterectomy.  HISTORY OF PRESENT ILLNESS:  The patient is a 50 year old patient with the above history.  She had an uneventful total vaginal hysterectomy on the morning of June 28.  Hospital course was essentially unremarkable.  She was voiding well, ambulating well, and tolerating p.o. well at the time of her discharge.  Postoperative day #2, her abdomen was soft. At this point, there were no contraindications to her being discharged.  DISCHARGE PHYSICAL EXAMINATION:  VITAL SIGNS:  Stable.  The patient was afebrile.  LUNGS:  Clear to auscultation bilaterally.  HEART:  Regular rate and rhythm.  ABDOMEN:  Soft with good bowel sounds.  EXTREMITIES:  No clubbing, cyanosis, or edema.  VAGINAL:  There was minimal vaginal bleeding.  LABORATORY DATA:  Upon discharge, her white count was 8.3, hemoglobin 11.6, hematocrit 33.4, platelets were 203.  Urinalysis was negative.  PATHOLOGY:  Cervix was no pathologic abnormalities identified.  Benign secretory endometrium adenomyosis.  Uterine serosal fibrous adhesions.  DISCHARGE MEDICATIONS AND INSTRUCTIONS:  Diet as tolerated.  Activity as per the [facility] instruction sheet.  MEDICATIONS: 1. Percocet 1 every 3-4 hours as needed. 2. Ibuprofen 600 mg every 6 hours as needed. 3. Phenergan 25 mg every 6 hours as needed for nausea.  FOLLOW-UP:  In six weeks at [facility]. DD:  06/11/00 TD:  06/15/00 Job: [id number]

## 2011-08-10 LAB — HM COLONOSCOPY

## 2012-02-11 ENCOUNTER — Other Ambulatory Visit: Payer: Self-pay

## 2012-09-26 LAB — HM MAMMOGRAPHY

## 2012-11-02 ENCOUNTER — Encounter: Payer: Self-pay | Admitting: Cardiovascular Disease

## 2012-11-02 ENCOUNTER — Ambulatory Visit (INDEPENDENT_AMBULATORY_CARE_PROVIDER_SITE_OTHER): Payer: BC Managed Care – PPO | Admitting: Cardiovascular Disease

## 2012-11-02 VITALS — BP 140/90 | HR 72 | Ht 61.0 in | Wt 157.0 lb

## 2012-11-02 DIAGNOSIS — R072 Precordial pain: Secondary | ICD-10-CM

## 2012-11-02 DIAGNOSIS — R079 Chest pain, unspecified: Secondary | ICD-10-CM

## 2012-11-02 NOTE — Patient Instructions (Addendum)
Your physician has requested that you have a stress echocardiogram. For further information please visit [email or web address] Please follow instruction sheet as given.  You have been referred to Dr Rene Paci for Primary Care [phone])  Your physician recommends that you schedule a follow-up appointment as needed.

## 2012-11-03 ENCOUNTER — Encounter: Payer: Self-pay | Admitting: Cardiovascular Disease

## 2012-11-03 NOTE — Progress Notes (Signed)
HPI:  51 year-old woman presents for cardiac evaluation of palpitations and chest pain. She has had palpitations for many years, but worse over past few months. She has been more concerned about chest pain that has been present over this same time period. She complains of chest burning relieved with belching. This has occurred at rest and with exertion. She has a history of GERD and is not sure if this is related to her GERD. Her discomfort associated with walking is described as 'pressure.' No dyspnea, orthopnea, edema, or PND. She's been under a great deal of stress at home caring for her father and her children.   She has HTN which has been well-controlled. Takes statin drug, apparently because of elevated CRP. States lipids have not been significantly elevated.  Outpatient Encounter Prescriptions as of 11/02/2012  Medication Sig Dispense Refill  . ALPRAZolam (XANAX) 0.5 MG tablet Take 0.5 mg by mouth at bedtime as needed.      . B Complex-C (B-COMPLEX WITH VITAMIN C) tablet Take 1 tablet by mouth daily.      . bifidobacterium infantis (ALIGN) capsule Take 1 capsule by mouth daily.      . Calcium Carbonate-Vitamin D (CALCIUM-VITAMIN D) 500-200 MG-UNIT per tablet Take 1 tablet by mouth 2 (two) times daily with a meal.      . cholecalciferol (VITAMIN D) 400 UNITS TABS Take 400 Units by mouth daily.      . clonazePAM (KLONOPIN) 0.5 MG tablet Take 0.5 mg by mouth as needed.      Marland Kitchen co-enzyme Q-10 30 MG capsule Take 300 mg by mouth daily.      Marland Kitchen dexlansoprazole (DEXILANT) 60 MG capsule Take 60 mg by mouth daily.      . famotidine (PEPCID) 10 MG tablet Take 10 mg by mouth at bedtime as needed.      . fexofenadine (ALLEGRA) 60 MG tablet Take 60 mg by mouth 2 (two) times daily.      . hydrochlorothiazide (HYDRODIURIL) 25 MG tablet Take 25 mg by mouth daily.      Boris Lown Oil 500 MG CAPS Take 500 mg by mouth daily.      . montelukast (SINGULAIR) 10 MG tablet Take 10 mg by mouth at bedtime.      .  Multiple Vitamin (MULTIVITAMIN) capsule Take 1 capsule by mouth daily.      Marland Kitchen olopatadine (PATANOL) 0.1 % ophthalmic solution Place 1 drop into both eyes 2 (two) times daily.      . simvastatin (ZOCOR) 20 MG tablet Take 20 mg by mouth every evening.      . sucralfate (CARAFATE) 1 G tablet Take 1 g by mouth as needed.      . valsartan (DIOVAN) 80 MG tablet Take 80 mg by mouth daily.        Ephedrine  Past Medical History  Diagnosis Date  . Fibromyalgia   . Asthma   . Hyperlipidemia   . Hypertension   . Mitral valve prolapse     Past Surgical History  Procedure Date  . Tmj arthroplasty   . Cholecystectomy   . Vesicovaginal fistula closure w/ tah   . Hemorrhoidectomy with hemorrhoid banding   . Rectocele repair     History   Social History  . Marital Status: Married    Spouse Name: N/A    Number of Children: N/A  . Years of Education: N/A   Occupational History  . Not on file.   Social History Main Topics  .  Smoking status: Former Smoker    Quit date: 11/23/1981  . Smokeless tobacco: Not on file  . Alcohol Use: Not on file  . Drug Use: Not on file  . Sexually Active: Not on file   Other Topics Concern  . Not on file   Social History Narrative   Married with grown children. Works for [employer] as a Nurse, children's. Nonsmoker, quit in 1983. Rare social Etoh.    Family history: brother with AFib in his 16s, father had CAD diagnosed in his 93s. No MI or premature CAD.  ROS: General: no fevers/chills/night sweats, positive for fatigue Eyes: no blurry vision, diplopia, or amaurosis ENT: no sore throat or hearing loss, positive for seasonal allergies Resp: no cough, wheezing, or hemoptysis CV: no edema or palpitations GI: no abdominal pain, nausea, vomiting, diarrhea, or constipation. Positive for dyspepsia GU: no dysuria, frequency, or hematuria Skin: no rash Neuro: no headache, numbness, tingling, or weakness of extremities Musculoskeletal: no joint pain or  swelling Heme: no bleeding, DVT, or easy bruising Endo: no polydipsia or polyuria  BP 140/90  Pulse 72  Ht 5\' 1"  (1.549 m)  Wt 71.215 kg (157 lb)  BMI 29.67 kg/m2  PHYSICAL EXAM: Pt is alert and oriented, WD, WN, in no distress. HEENT: normal Neck: JVP normal. Carotid upstrokes normal without bruits. No thyromegaly. Lungs: equal expansion, clear bilaterally CV: Apex is discrete and nondisplaced, RRR with a midsystolic click, no murmur or gallop Abd: soft, NT, +BS, no bruit, no hepatosplenomegaly Back: no CVA tenderness Ext: no C/C/E        Femoral pulses 2+= without bruits        DP/PT pulses intact and = Skin: warm and dry without rash Neuro: CNII-XII intact             Strength intact = bilaterally  EKG:  NSR 72 bpm, within normal limits  ASSESSMENT AND PLAN: 1. Chest pain with typical and atypical features. Pt risk factors include hyperlipidemia, HTN, and family hx of CAD (not premature). Recommend a stress echo for risk stratification.   2. Palpitations, longstanding. Reassured patient. No indication at this point for further testing. Advised to push fluids, minimize caffeine, and exercise.  3. Hyperlipidemia. Previously followed by PCP and reportedly well-controlled. On statin drug.   For follow-up, I will see her back as needed unless her stress echo is abnormal. She requests referral to an internist and I have referred her to Dr Felicity Coyer.  Tonny Bollman 11/03/2012 11:18 PM

## 2012-11-08 ENCOUNTER — Ambulatory Visit: Payer: Self-pay | Admitting: Cardiovascular Disease

## 2012-11-10 ENCOUNTER — Other Ambulatory Visit (HOSPITAL_COMMUNITY): Payer: BC Managed Care – PPO

## 2012-11-10 ENCOUNTER — Encounter: Payer: Self-pay | Admitting: Cardiovascular Disease

## 2012-11-10 ENCOUNTER — Ambulatory Visit (HOSPITAL_COMMUNITY): Payer: BC Managed Care – PPO | Attending: Cardiology

## 2012-11-10 DIAGNOSIS — I10 Essential (primary) hypertension: Secondary | ICD-10-CM | POA: Insufficient documentation

## 2012-11-10 DIAGNOSIS — R072 Precordial pain: Secondary | ICD-10-CM | POA: Insufficient documentation

## 2012-11-10 DIAGNOSIS — R002 Palpitations: Secondary | ICD-10-CM | POA: Insufficient documentation

## 2012-11-10 NOTE — Progress Notes (Signed)
Echocardiogram performed.  

## 2013-01-03 ENCOUNTER — Other Ambulatory Visit: Payer: Self-pay | Admitting: Gastroenterology

## 2013-02-06 ENCOUNTER — Ambulatory Visit (HOSPITAL_COMMUNITY)
Admission: RE | Admit: 2013-02-06 | Discharge: 2013-02-06 | Disposition: A | Discharge status: Home / Self Care | Payer: BC Managed Care – PPO | Source: Ambulatory Visit | Attending: Gastroenterology | Admitting: Gastroenterology

## 2013-02-06 ENCOUNTER — Encounter (HOSPITAL_COMMUNITY): Payer: Self-pay | Admitting: *Deleted

## 2013-02-06 ENCOUNTER — Encounter (HOSPITAL_COMMUNITY)
Admission: RE | Disposition: A | Discharge status: Home / Self Care | Payer: Self-pay | Source: Ambulatory Visit | Attending: Gastroenterology

## 2013-02-06 DIAGNOSIS — I10 Essential (primary) hypertension: Secondary | ICD-10-CM | POA: Insufficient documentation

## 2013-02-06 DIAGNOSIS — IMO0001 Reserved for inherently not codable concepts without codable children: Secondary | ICD-10-CM | POA: Insufficient documentation

## 2013-02-06 DIAGNOSIS — K219 Gastro-esophageal reflux disease without esophagitis: Secondary | ICD-10-CM | POA: Insufficient documentation

## 2013-02-06 DIAGNOSIS — J45909 Unspecified asthma, uncomplicated: Secondary | ICD-10-CM | POA: Insufficient documentation

## 2013-02-06 DIAGNOSIS — Z90710 Acquired absence of both cervix and uterus: Secondary | ICD-10-CM | POA: Insufficient documentation

## 2013-02-06 DIAGNOSIS — E785 Hyperlipidemia, unspecified: Secondary | ICD-10-CM | POA: Insufficient documentation

## 2013-02-06 DIAGNOSIS — D131 Benign neoplasm of stomach: Secondary | ICD-10-CM | POA: Insufficient documentation

## 2013-02-06 HISTORY — PX: ESOPHAGOGASTRODUODENOSCOPY: SHX5428

## 2013-02-06 HISTORY — PX: BRAVO PH STUDY: SHX5421

## 2013-02-06 HISTORY — DX: Gastro-esophageal reflux disease without esophagitis: K21.9

## 2013-02-06 SURGERY — EGD (ESOPHAGOGASTRODUODENOSCOPY)
Anesthesia: Moderate Sedation

## 2013-02-06 MED ORDER — LIDOCAINE VISCOUS 2 % MT SOLN
OROMUCOSAL | Status: DC | PRN
  Administered 2013-02-06: 20 mL via OROMUCOSAL

## 2013-02-06 MED ORDER — LIDOCAINE VISCOUS 2 % MT SOLN
OROMUCOSAL | Status: AC
  Filled 2013-02-06: qty 15

## 2013-02-06 MED ORDER — SODIUM CHLORIDE 0.9 % IV SOLN: INTRAVENOUS | Status: DC

## 2013-02-06 MED ORDER — DIPHENHYDRAMINE HCL 50 MG/ML IJ SOLN
INTRAMUSCULAR | Status: AC
  Filled 2013-02-06: qty 1

## 2013-02-06 MED ORDER — DIPHENHYDRAMINE HCL 50 MG/ML IJ SOLN
INTRAMUSCULAR | Status: DC | PRN
  Administered 2013-02-06: 25 mg via INTRAVENOUS

## 2013-02-06 MED ORDER — FENTANYL CITRATE 0.05 MG/ML IJ SOLN
INTRAMUSCULAR | Status: AC
  Filled 2013-02-06: qty 4

## 2013-02-06 MED ORDER — MIDAZOLAM HCL 10 MG/2ML IJ SOLN
INTRAMUSCULAR | Status: DC | PRN
  Administered 2013-02-06 (×6): 2 mg via INTRAVENOUS

## 2013-02-06 MED ORDER — FENTANYL CITRATE 0.05 MG/ML IJ SOLN
INTRAMUSCULAR | Status: DC | PRN
  Administered 2013-02-06 (×5): 25 ug via INTRAVENOUS

## 2013-02-06 MED ORDER — MIDAZOLAM HCL 10 MG/2ML IJ SOLN
INTRAMUSCULAR | Status: AC
  Filled 2013-02-06: qty 4

## 2013-02-06 NOTE — Op Note (Signed)
[facility] [address] [place], [address]   OPERATIVE PROCEDURE REPORT  PATIENT :Kayla Sanders, Kayla Sanders  MR#: [id number] BIRTHDATE :1961-07-07 GENDER: Female ENDOSCOPIST:  Lorenza Burton, MD ASSISTANT:   Harold Barban, RN, Olivia Mackie, RN & Windell Hummingbird, technician PROCEDURE DATE: 12-Feb-2013 PRE-PROCEDURE PREPERATION: Patient fasted for 4 hours prior to procedure. PRE-PROCEDURE PHYSICAL: Patient has stable vital signs.  Neck is supple.  There is no JVD, thyromegaly or LAD.  Chest clear to auscultation.  S1 and S2 regular.  Abdomen soft, non-distended, non-tender with NABS. PROCEDURE:     EGD w/ biopsy and EGD w/ snare technique ASA CLASS:     Class III INDICATIONS:     GERD. MEDICATIONS:     Fentanyl 125 mcg IV, Versed 12 mg IV, and Benadryl 25 mg IV TOPICAL ANESTHETIC:   Viscous Xylocaine-15 cc PO.  DESCRIPTION OF PROCEDURE: After the risks benefits and alternatives of the procedure were thoroughly explained, informed consent was obtained.  The Pentax Gastroscope [id number]  was introduced through the mouth and advanced to the second portion of the duodenum , without limitations. The instrument was slowly withdrawn as the mucosa was fully examined.   The esophagus and the GEJ were widely patent with the GEJ measured at 40 cm. Multiple sessile polyps were noted in the midbody of the stomach and 5 of the largest ones were removed by hot snare x 5 [200/20]. The mucosa of the proximal small bowel seemed scalloped and this was biopsied for pathology. The rest of the stoamch appeared normal. There were no ulcers, erosions or masses  noted. Retroflexed views revealed no abnormalities. The Bravo was placed at 34 cm in the routine manner but when I repeated an esophagoscopy, I noted it at the arytenoids-the patient swallowed it and on rescoping the patient it was found in the stomach. Repeat placement was not attempted. The scope was then withdrawn from  the patient and the procedure terminated. The patient tolerated the procedure without immediate complications.  IMPRESSION:  1) Normal appearing esophagus and GEJ-Bravo noted in the stomach after it did not deploy in the esophagus. 2) Multiple sessile gastric polyps-4 removed by hot snare x 4. 3) Scalloped mucosa in proximal small-biopsied for pathology.  RECOMMENDATIONS:     1.  Await pathology results. 2.  Avoid NSAIDS for now. 3.  Anti-reflux regimen to be followed. 4.  Continue current medications. 5.  Continue PPI's. 6.  OP follow-up in 2 weeks.   REPEAT EXAM:  None planned for now. Marland Kitchen DISCHARGE INSTRUCTIONS: Standard discharge instructions. _______________________________ eSigned:  Dr. Lorenza Burton, MD 2013/02/12 2:47 PM   CPT CODES:     95621-30, 86578-46  DIAGNOSIS CODES:     530.81 , 211.1   CC: Nadyne Coombes, M.D.  PATIENT NAME:  Kayla Sanders, Kayla Sanders MR#: [id number]

## 2013-02-06 NOTE — H&P (Signed)
Kayla Sanders is an 52 y.o. female.   Chief Complaint: Severe reflux. HPI: Patient is here for an EGD with Bravo.  Past Medical History  Diagnosis Date  . Fibromyalgia   . Asthma   . Hyperlipidemia   . Hypertension   . Mitral valve prolapse   . GERD (gastroesophageal reflux disease)     Past Surgical History  Procedure Laterality Date  . Tmj arthroplasty      and removal  . Cholecystectomy    . Vesicovaginal fistula closure w/ tah    . Hemorrhoidectomy with hemorrhoid banding    . Rectocele repair    . Abdominal hysterectomy    . Nasal septum surgery    . Carpal tunnel release    . Tubal ligation     History reviewed. No pertinent family history. Social History:  reports that she quit smoking about 31 years ago. She does not have any smokeless tobacco history on file. She reports that  drinks alcohol. Her drug history is not on file.  Allergies:  Allergies  Allergen Reactions  . Ephedrine     palpitations    Medications Prior to Admission  Medication Sig Dispense Refill  . ALPRAZolam (XANAX) 0.5 MG tablet Take 0.5 mg by mouth at bedtime as needed.      . B Complex-C (B-COMPLEX WITH VITAMIN C) tablet Take 1 tablet by mouth daily.      . bifidobacterium infantis (ALIGN) capsule Take 1 capsule by mouth daily.      . Calcium Carbonate-Vitamin D (CALCIUM-VITAMIN D) 500-200 MG-UNIT per tablet Take 1 tablet by mouth 2 (two) times daily with a meal.      . cholecalciferol (VITAMIN D) 400 UNITS TABS Take 400 Units by mouth daily.      . clonazePAM (KLONOPIN) 0.5 MG tablet Take 0.5 mg by mouth as needed.      Marland Kitchen co-enzyme Q-10 30 MG capsule Take 300 mg by mouth daily.      Marland Kitchen dexlansoprazole (DEXILANT) 60 MG capsule Take 60 mg by mouth daily.      . famotidine (PEPCID) 10 MG tablet Take 10 mg by mouth at bedtime as needed.      . fexofenadine (ALLEGRA) 60 MG tablet Take 60 mg by mouth 2 (two) times daily.      . hydrochlorothiazide (HYDRODIURIL) 25 MG tablet Take 25 mg by  mouth daily.      . montelukast (SINGULAIR) 10 MG tablet Take 10 mg by mouth at bedtime.      . Multiple Vitamin (MULTIVITAMIN) capsule Take 1 capsule by mouth daily.      Marland Kitchen olopatadine (PATANOL) 0.1 % ophthalmic solution Place 1 drop into both eyes 2 (two) times daily.      . simvastatin (ZOCOR) 20 MG tablet Take 20 mg by mouth every evening.      . sucralfate (CARAFATE) 1 G tablet Take 1 g by mouth as needed.      . valsartan (DIOVAN) 80 MG tablet Take 80 mg by mouth daily.      Boris Lown Oil 500 MG CAPS Take 500 mg by mouth daily.       No results found for this or any previous visit (from the past 48 hour(s)). No results found.  Review of Systems  Constitutional: Negative.   HENT: Negative.   Respiratory: Negative.   Gastrointestinal: Positive for heartburn.  Genitourinary: Negative.   Skin: Negative.   Neurological: Negative.   Endo/Heme/Allergies: Negative.   Psychiatric/Behavioral: Negative.  Blood pressure 141/86, pulse 81, temperature 98.5 F (36.9 C), temperature source Oral, resp. rate 18, height 5\' 1"  (1.549 m), weight 70.308 kg (155 lb), SpO2 100.00%. Physical Exam  Constitutional: She is oriented to person, place, and time. She appears well-developed and well-nourished.  HENT:  Head: Normocephalic and atraumatic.  Eyes: Conjunctivae and EOM are normal. Pupils are equal, round, and reactive to light.  Neck: Normal range of motion. Neck supple.  Cardiovascular: Normal rate, regular rhythm and normal heart sounds.   Respiratory: Breath sounds normal.  GI: Soft. Bowel sounds are normal.  Musculoskeletal: Normal range of motion.  Neurological: She is alert and oriented to person, place, and time.  Skin: Skin is warm and dry.  Psychiatric: She has a normal mood and affect. Her behavior is normal. Judgment and thought content normal.    Assessment/Plan Severe reflux: proceed with an EGD at this time.  Kayla Sanders 02/06/2013, 1:10 PM

## 2013-02-07 ENCOUNTER — Encounter (HOSPITAL_COMMUNITY): Payer: Self-pay | Admitting: Gastroenterology

## 2013-03-15 ENCOUNTER — Ambulatory Visit: Payer: BC Managed Care – PPO | Admitting: Internal Medicine

## 2013-03-20 ENCOUNTER — Ambulatory Visit (INDEPENDENT_AMBULATORY_CARE_PROVIDER_SITE_OTHER): Payer: BC Managed Care – PPO | Admitting: Internal Medicine

## 2013-03-20 ENCOUNTER — Encounter: Payer: Self-pay | Admitting: Internal Medicine

## 2013-03-20 VITALS — BP 118/84 | HR 65 | Temp 98.3°F | Ht 61.0 in | Wt 157.8 lb

## 2013-03-20 DIAGNOSIS — IMO0001 Reserved for inherently not codable concepts without codable children: Secondary | ICD-10-CM

## 2013-03-20 DIAGNOSIS — M8580 Other specified disorders of bone density and structure, unspecified site: Secondary | ICD-10-CM | POA: Insufficient documentation

## 2013-03-20 DIAGNOSIS — F419 Anxiety disorder, unspecified: Secondary | ICD-10-CM | POA: Insufficient documentation

## 2013-03-20 DIAGNOSIS — R5381 Other malaise: Secondary | ICD-10-CM

## 2013-03-20 DIAGNOSIS — K219 Gastro-esophageal reflux disease without esophagitis: Secondary | ICD-10-CM

## 2013-03-20 DIAGNOSIS — M899 Disorder of bone, unspecified: Secondary | ICD-10-CM

## 2013-03-20 DIAGNOSIS — M797 Fibromyalgia: Secondary | ICD-10-CM | POA: Insufficient documentation

## 2013-03-20 DIAGNOSIS — R5383 Other fatigue: Secondary | ICD-10-CM

## 2013-03-20 DIAGNOSIS — E785 Hyperlipidemia, unspecified: Secondary | ICD-10-CM

## 2013-03-20 DIAGNOSIS — I10 Essential (primary) hypertension: Secondary | ICD-10-CM

## 2013-03-20 NOTE — Patient Instructions (Signed)
It was good to see you today. We have reviewed your prior records including labs and tests today Health Maintenance reviewed - all recommended immunizations and age-appropriate screenings are up-to-date. Test(s) ordered today. Your results will be released to MyChart (or called to you) after review, usually within 72hours after test completion. If any changes need to be made, you will be notified at that same time. Medications reviewed and updated, no changes recommended at this time. we will send to your prior provider(s) for "release of records" as discussed today - [facility] and Dr Loreta Ave Please schedule followup in 6-12 months for annual exam and labs, call sooner if problems.

## 2013-03-20 NOTE — Assessment & Plan Note (Signed)
previous trials Lyrica and celexa not well tolerated because of side effects - denies need for med intervention at this time

## 2013-03-20 NOTE — Progress Notes (Signed)
Subjective:    Patient ID: Kayla Sanders, female    DOB: 06-14-1961, 52 y.o.   MRN: [id number]  HPI New patient to me and our practice, here today to establish with primary care provider. CPX done 10/203 at [facility] -  reviewed chronic medical issues:  Hypertension - On ARB and diuretic therapy for same - the patient reports compliance with medication(s) as prescribed. Denies adverse side effects.  Dyslipidemia - reports taking statin because of elevated CRP rather than high cholesterol - the patient reports compliance with medication(s) as prescribed. Denies adverse side effects.  GERD - working with GI for same. Multiple medications including PPI, H2B and Carafate for treatment of same. EGD March 2014 by Dr. Loreta Sanders reviewed  Past Medical History  Diagnosis Date  . Fibromyalgia   . Asthma   . Hyperlipidemia   . Hypertension   . Mitral valve prolapse   . GERD (gastroesophageal reflux disease)   . Migraines   . Anxiety    Family History  Problem Relation Age of Onset  . Arthritis Mother   . Hyperlipidemia Mother   . Hypertension Mother   . Arthritis Father   . Hyperlipidemia Father   . Heart disease Father   . Stroke Father   . Hypertension Father   . Arthritis Other   . Breast cancer Other   . Diabetes Other    History  Substance Use Topics  . Smoking status: Former Smoker    Quit date: 11/23/1981  . Smokeless tobacco: Not on file  . Alcohol Use: Yes    Review of Systems Constitutional: Negative for fever or weight change.  Respiratory: Negative for cough and shortness of breath.   Cardiovascular: Negative for palpitations.  Gastrointestinal: Negative for abdominal pain, no bowel changes.  Musculoskeletal: Negative for gait problem or joint swelling.  Skin: Negative for rash.  Neurological: Negative for dizziness or headache.  No other specific complaints in a complete review of systems (except as listed in HPI above).     Objective:   Physical Exam BP  118/84  Pulse 65  Temp(Src) 98.3 F (36.8 C) (Oral)  Ht 5\' 1"  (1.549 m)  Wt 157 lb 12.8 oz (71.578 kg)  BMI 29.83 kg/m2  SpO2 95% Wt Readings from Last 3 Encounters:  03/20/13 157 lb 12.8 oz (71.578 kg)  02/06/13 155 lb (70.308 kg)  02/06/13 155 lb (70.308 kg)   Constitutional: She is overweight, but appears well-developed and well-nourished. No distress.  HENT: Head: Normocephalic and atraumatic. Ears: B TMs ok, no erythema or effusion; Nose: Nose normal. Mouth/Throat: Oropharynx is clear and moist. No oropharyngeal exudate.  Eyes: Conjunctivae and EOM are normal. Pupils are equal, round, and reactive to light. No scleral icterus.  Neck: Normal range of motion. Neck supple. No JVD present. No thyromegaly present.  Cardiovascular: Normal rate, regular rhythm and normal heart sounds.  No murmur heard. No BLE edema. Pulmonary/Chest: Effort normal and breath sounds normal. No respiratory distress. She has no wheezes.  Abdominal: Soft. Bowel sounds are normal. She exhibits no distension. There is no tenderness. no masses Musculoskeletal: Normal range of motion, no joint effusions. No gross deformities Neurological: She is alert and oriented to person, place, and time. No cranial nerve deficit. Coordination, balance, strength, speech and gait are normal.  Skin: Rosacea on face -Skin is warm and dry. No rash noted. No erythema.  Psychiatric: She has a normal mood and affect. Her behavior is normal. Judgment and thought content normal.   Lab  Results  Component Value Date   WBC 9.9 01/05/2009   HGB 14.3 01/05/2009   HCT 41.1 01/05/2009   PLT 260 01/05/2009   GLUCOSE 129* 01/05/2009   ALT 18 01/05/2009   AST 19 01/05/2009   NA 139 01/05/2009   K 3.1* 01/05/2009   CL 104 01/05/2009   CREATININE 0.77 01/05/2009   BUN 14 01/05/2009   CO2 27 01/05/2009       Assessment & Plan:   See problem list. Medications and labs reviewed today.  Fatigue - nonspecific symptoms/exam - check screening  labs  send to other provider(s) for "release of records" - colo, CPX labs 08/2012 from [facility] for mammo and DEXA

## 2013-03-30 ENCOUNTER — Other Ambulatory Visit (INDEPENDENT_AMBULATORY_CARE_PROVIDER_SITE_OTHER): Payer: BC Managed Care – PPO

## 2013-03-30 DIAGNOSIS — R5381 Other malaise: Secondary | ICD-10-CM

## 2013-03-30 DIAGNOSIS — I10 Essential (primary) hypertension: Secondary | ICD-10-CM

## 2013-03-30 DIAGNOSIS — E785 Hyperlipidemia, unspecified: Secondary | ICD-10-CM

## 2013-03-30 LAB — HEPATIC FUNCTION PANEL
ALT: 26 U/L (ref 0–35)
AST: 21 U/L (ref 0–37)
Bilirubin, Direct: 0.2 mg/dL (ref 0.0–0.3)
Total Protein: 6.6 g/dL (ref 6.0–8.3)

## 2013-03-30 LAB — URINALYSIS, ROUTINE W REFLEX MICROSCOPIC
Leukocytes, UA: NEGATIVE
Nitrite: NEGATIVE
Total Protein, Urine: NEGATIVE
pH: 7 (ref 5.0–8.0)

## 2013-03-30 LAB — BASIC METABOLIC PANEL
BUN: 12 mg/dL (ref 6–23)
Calcium: 8.9 mg/dL (ref 8.4–10.5)
Creatinine, Ser: 0.6 mg/dL (ref 0.4–1.2)
GFR: 103.74 mL/min (ref >60.00)
Potassium: 3.3 mEq/L — ABNORMAL LOW (ref 3.5–5.1)

## 2013-03-30 LAB — CBC WITH DIFFERENTIAL/PLATELET
Basophils Absolute: 0 10*3/uL (ref 0.0–0.1)
Eosinophils Relative: 1.2 % (ref 0.0–5.0)
HCT: 38.7 % (ref 36.0–46.0)
Lymphocytes Relative: 27.1 % (ref 12.0–46.0)
Monocytes Relative: 5.9 % (ref 3.0–12.0)
Neutrophils Relative %: 65.6 % (ref 43.0–77.0)
Platelets: 235 10*3/uL (ref 150.0–400.0)
WBC: 8.5 10*3/uL (ref 4.5–10.5)

## 2013-03-30 LAB — TSH: TSH: 4.58 u[IU]/mL (ref 0.35–5.50)

## 2013-03-30 LAB — LIPID PANEL
Cholesterol: 146 mg/dL (ref 0–200)
VLDL: 19.8 mg/dL (ref 0.0–40.0)

## 2013-04-10 ENCOUNTER — Encounter: Payer: Self-pay | Admitting: Internal Medicine

## 2013-06-01 ENCOUNTER — Ambulatory Visit: Payer: BC Managed Care – PPO | Admitting: Internal Medicine

## 2013-07-26 ENCOUNTER — Encounter: Payer: Self-pay | Admitting: Internal Medicine

## 2013-07-26 ENCOUNTER — Ambulatory Visit (INDEPENDENT_AMBULATORY_CARE_PROVIDER_SITE_OTHER): Payer: BC Managed Care – PPO | Admitting: Internal Medicine

## 2013-07-26 VITALS — BP 124/90 | HR 89 | Temp 99.2°F | Wt 157.0 lb

## 2013-07-26 DIAGNOSIS — J309 Allergic rhinitis, unspecified: Secondary | ICD-10-CM

## 2013-07-26 DIAGNOSIS — J019 Acute sinusitis, unspecified: Secondary | ICD-10-CM

## 2013-07-26 MED ORDER — PHENYLEPH-PROMETHAZINE-COD 5-6.25-10 MG/5ML PO SYRP: 5.0000 mL | ORAL_SOLUTION | Freq: Two times a day (BID) | ORAL | Status: DC | PRN

## 2013-07-26 MED ORDER — FLUTICASONE PROPIONATE 50 MCG/ACT NA SUSP: 2.0000 | Freq: Every day | NASAL | Status: DC

## 2013-07-26 MED ORDER — AMOXICILLIN-POT CLAVULANATE 875-125 MG PO TABS: 1.0000 | ORAL_TABLET | Freq: Two times a day (BID) | ORAL | Status: AC

## 2013-07-26 NOTE — Progress Notes (Signed)
  Subjective:    Patient ID: Kayla Sanders, female    DOB: Nov 07, 1961, 52 y.o.   MRN: [id number]  HPI  Patient here today with c/o cough and cold symptoms for one week. Complains of sinus pressure, nasal drainage, ear pain, productive cough with yellow/green sputum, myalgias, fever and chills.  She has taken Mucinex, Fluticosone, Allegra, Patonel eye drops without significant improvement.    Review of Systems Constitutional: Positive for fevers, chills.  Denies recent weight gain/weight loss. HEENT: Positive for ear pain, sinus pressure, drainage.   Respiratory: Positive for productive cough with green/yellow sputum.  Negative for shortness of breath. CV: Negative for chest pain   Past Medical History  Diagnosis Date  . Fibromyalgia   . Asthma   . Hyperlipidemia   . Hypertension   . Mitral valve prolapse   . GERD (gastroesophageal reflux disease)   . Migraines   . Anxiety        Objective:   Physical Exam BP 124/90  Pulse 89  Temp(Src) 99.2 F (37.3 C) (Oral)  Wt 157 lb (71.215 kg)  BMI 29.68 kg/m2  SpO2 98% Wt Readings from Last 3 Encounters:  07/26/13 157 lb (71.215 kg)  03/20/13 157 lb 12.8 oz (71.578 kg)  02/06/13 155 lb (70.308 kg)    Constitutional: She is overweight, but appears well-developed and well-nourished. No distress.  HENT: Head: Normocephalic and atraumatic. Positive maxillary sinus tenderness. Ears: Right TM with injected area in upper right quadrant.  Left TM without erythema or exudate Nose: Nose normal. Mouth/Throat: Oropharynx is clear and moist. No oropharyngeal exudate.  Eyes: Conjunctivae and EOM are normal. Pupils are equal, round, and reactive to light. No scleral icterus. Clear eye drainage.  Neck: Normal range of motion. Neck supple. No cervical lymphadenopathy Cardiovascular: Normal rate, regular rhythm and normal heart sounds. No murmur heard. No BLE edema.  Pulmonary/Chest: Effort normal and breath sounds normal. No respiratory  distress. She has no wheezes.  Skin: Rosacea on face -Skin is warm and dry. No rash noted. No erythema.       Assessment & Plan:   1.  Acute sinusitis- RX with Augmentin and Promethazine cough syrup.  Pt advised to continue Allegra, Nasocort, Patonel, and Mucinex. Encouraged hydration.  Pt to call back if symptoms worsen/do not improve.   2.  Allergic rhinitis- continue Patonel, Allegra, Nasocort. Call back if symptoms worsen/do not improve.

## 2013-07-26 NOTE — Patient Instructions (Addendum)
It was good to see you today. Augmentin antibiotics 2x/day x 10days and prescription nasal steroid spray each day - Your prescription(s) have been submitted to your pharmacy. Please take as directed and contact our office if you believe you are having problem(s) with the medication(s). Alternate between ibuprofen and tylenol for aches, pain and fever symptoms as discussed Hydrate, rest and call if worse or unimproved  Sinusitis Sinusitis is redness, soreness, and swelling (inflammation) of the paranasal sinuses. Paranasal sinuses are air pockets within the bones of your face (beneath the eyes, the middle of the forehead, or above the eyes). In healthy paranasal sinuses, mucus is able to drain out, and air is able to circulate through them by way of your nose. However, when your paranasal sinuses are inflamed, mucus and air can become trapped. This can allow bacteria and other germs to grow and cause infection. Sinusitis can develop quickly and last only a short time (acute) or continue over a long period (chronic). Sinusitis that lasts for more than 12 weeks is considered chronic.  CAUSES  Causes of sinusitis include:  Allergies.  Structural abnormalities, such as displacement of the cartilage that separates your nostrils (deviated septum), which can decrease the air flow through your nose and sinuses and affect sinus drainage.  Functional abnormalities, such as when the small hairs (cilia) that line your sinuses and help remove mucus do not work properly or are not present. SYMPTOMS  Symptoms of acute and chronic sinusitis are the same. The primary symptoms are pain and pressure around the affected sinuses. Other symptoms include:  Upper toothache.  Earache.  Headache.  Bad breath.  Decreased sense of smell and taste.  A cough, which worsens when you are lying flat.  Fatigue.  Fever.  Thick drainage from your nose, which often is green and may contain pus (purulent).  Swelling  and warmth over the affected sinuses. DIAGNOSIS  Your caregiver will perform a physical exam. During the exam, your caregiver may:  Look in your nose for signs of abnormal growths in your nostrils (nasal polyps).  Tap over the affected sinus to check for signs of infection.  View the inside of your sinuses (endoscopy) with a special imaging device with a light attached (endoscope), which is inserted into your sinuses. If your caregiver suspects that you have chronic sinusitis, one or more of the following tests may be recommended:  Allergy tests.  Nasal culture A sample of mucus is taken from your nose and sent to a lab and screened for bacteria.  Nasal cytology A sample of mucus is taken from your nose and examined by your caregiver to determine if your sinusitis is related to an allergy. TREATMENT  Most cases of acute sinusitis are related to a viral infection and will resolve on their own within 10 days. Sometimes medicines are prescribed to help relieve symptoms (pain medicine, decongestants, nasal steroid sprays, or saline sprays).  However, for sinusitis related to a bacterial infection, your caregiver will prescribe antibiotic medicines. These are medicines that will help kill the bacteria causing the infection.  Rarely, sinusitis is caused by a fungal infection. In theses cases, your caregiver will prescribe antifungal medicine. For some cases of chronic sinusitis, surgery is needed. Generally, these are cases in which sinusitis recurs more than 3 times per year, despite other treatments. HOME CARE INSTRUCTIONS   Drink plenty of water. Water helps thin the mucus so your sinuses can drain more easily.  Use a humidifier.  Inhale steam 3  to 4 times a day (for example, sit in the bathroom with the shower running).  Apply a warm, moist washcloth to your face 3 to 4 times a day, or as directed by your caregiver.  Use saline nasal sprays to help moisten and clean your sinuses.  Take  over-the-counter or prescription medicines for pain, discomfort, or fever only as directed by your caregiver. SEEK IMMEDIATE MEDICAL CARE IF:  You have increasing pain or severe headaches.  You have nausea, vomiting, or drowsiness.  You have swelling around your face.  You have vision problems.  You have a stiff neck.  You have difficulty breathing. MAKE SURE YOU:   Understand these instructions.  Will watch your condition.  Will get help right away if you are not doing well or get worse. Document Released: 11/09/2005 Document Revised: 02/01/2012 Document Reviewed: 11/24/2011 [facility] Patient Information 2014 [place], [place].

## 2013-08-15 ENCOUNTER — Ambulatory Visit: Payer: BC Managed Care – PPO | Admitting: Internal Medicine

## 2013-09-05 ENCOUNTER — Ambulatory Visit (INDEPENDENT_AMBULATORY_CARE_PROVIDER_SITE_OTHER): Payer: BC Managed Care – PPO | Admitting: Internal Medicine

## 2013-09-05 ENCOUNTER — Encounter: Payer: Self-pay | Admitting: Internal Medicine

## 2013-09-05 ENCOUNTER — Other Ambulatory Visit (INDEPENDENT_AMBULATORY_CARE_PROVIDER_SITE_OTHER): Payer: BC Managed Care – PPO

## 2013-09-05 VITALS — BP 130/84 | HR 76 | Temp 98.6°F | Ht 61.0 in | Wt 157.8 lb

## 2013-09-05 DIAGNOSIS — Z0000 Encounter for general adult medical examination without abnormal findings: Secondary | ICD-10-CM

## 2013-09-05 DIAGNOSIS — Z23 Encounter for immunization: Secondary | ICD-10-CM

## 2013-09-05 DIAGNOSIS — I10 Essential (primary) hypertension: Secondary | ICD-10-CM

## 2013-09-05 DIAGNOSIS — K219 Gastro-esophageal reflux disease without esophagitis: Secondary | ICD-10-CM

## 2013-09-05 DIAGNOSIS — IMO0001 Reserved for inherently not codable concepts without codable children: Secondary | ICD-10-CM

## 2013-09-05 DIAGNOSIS — M797 Fibromyalgia: Secondary | ICD-10-CM

## 2013-09-05 LAB — LIPID PANEL
Cholesterol: 182 mg/dL (ref 0–200)
LDL Cholesterol: 108 mg/dL — ABNORMAL HIGH (ref 0–99)

## 2013-09-05 LAB — BASIC METABOLIC PANEL
BUN: 14 mg/dL (ref 6–23)
CO2: 31 mEq/L (ref 19–32)
Calcium: 9.8 mg/dL (ref 8.4–10.5)
Creatinine, Ser: 0.7 mg/dL (ref 0.4–1.2)
GFR: 88.97 mL/min (ref >60.00)
Glucose, Bld: 91 mg/dL (ref 70–99)
Sodium: 139 mEq/L (ref 135–145)

## 2013-09-05 LAB — CBC WITH DIFFERENTIAL/PLATELET
Basophils Absolute: 0 10*3/uL (ref 0.0–0.1)
Eosinophils Absolute: 0.1 10*3/uL (ref 0.0–0.7)
Eosinophils Relative: 0.8 % (ref 0.0–5.0)
Hemoglobin: 14.1 g/dL (ref 12.0–15.0)
Lymphocytes Relative: 28.9 % (ref 12.0–46.0)
Lymphs Abs: 2.5 10*3/uL (ref 0.7–4.0)
MCHC: 33.9 g/dL (ref 30.0–36.0)
Monocytes Relative: 6.4 % (ref 3.0–12.0)
Neutro Abs: 5.5 10*3/uL (ref 1.4–7.7)
Neutrophils Relative %: 63.6 % (ref 43.0–77.0)
Platelets: 256 10*3/uL (ref 150.0–400.0)
RBC: 4.91 Mil/uL (ref 3.87–5.11)
RDW: 14.3 % (ref 11.5–14.6)

## 2013-09-05 LAB — TSH: TSH: 3.87 u[IU]/mL (ref 0.35–5.50)

## 2013-09-05 LAB — HEPATIC FUNCTION PANEL
ALT: 42 U/L — ABNORMAL HIGH (ref 0–35)
AST: 32 U/L (ref 0–37)
Alkaline Phosphatase: 52 U/L (ref 39–117)
Total Bilirubin: 1.2 mg/dL (ref 0.3–1.2)

## 2013-09-05 MED ORDER — SIMVASTATIN 20 MG PO TABS: 20.0000 mg | ORAL_TABLET | Freq: Every evening | ORAL | Status: DC

## 2013-09-05 MED ORDER — VALSARTAN 80 MG PO TABS: 80.0000 mg | ORAL_TABLET | Freq: Every day | ORAL | Status: DC

## 2013-09-05 MED ORDER — ALPRAZOLAM 0.5 MG PO TABS: 0.5000 mg | ORAL_TABLET | Freq: Three times a day (TID) | ORAL | Status: DC | PRN

## 2013-09-05 MED ORDER — MONTELUKAST SODIUM 10 MG PO TABS: 10.0000 mg | ORAL_TABLET | Freq: Every day | ORAL | Status: AC

## 2013-09-05 MED ORDER — HYDROCHLOROTHIAZIDE 25 MG PO TABS: 25.0000 mg | ORAL_TABLET | Freq: Every day | ORAL | Status: DC

## 2013-09-05 MED ORDER — CLONAZEPAM 0.5 MG PO TABS: 0.5000 mg | ORAL_TABLET | Freq: Three times a day (TID) | ORAL | Status: DC | PRN

## 2013-09-05 NOTE — Assessment & Plan Note (Signed)
Follows with GI Loreta Ave) for same Interval events reviewed and updated symptoms stable at this time

## 2013-09-05 NOTE — Progress Notes (Signed)
Subjective:    Patient ID: Kayla Sanders, female    DOB: Nov 14, 1961, 52 y.o.   MRN: [id number]  HPI patient is here today for annual physical. Patient feels well and has no complaints.  Also reviewed chronic medical issues:  Hypertension - On ARB and diuretic therapy for same - the patient reports compliance with medication(s) as prescribed. Denies adverse side effects.  Dyslipidemia - reports taking statin because of elevated CRP rather than high cholesterol - the patient reports compliance with medication(s) as prescribed. Denies adverse side effects.  GERD - working with GI for same. Multiple medications including PPI, H2B and Carafate for treatment of same. EGD March 2014 by Dr. Loreta Ave reviewed  Past Medical History  Diagnosis Date  . Fibromyalgia   . Asthma   . Hyperlipidemia   . Hypertension   . Mitral valve prolapse   . GERD (gastroesophageal reflux disease)   . Migraines   . Anxiety    Family History  Problem Relation Age of Onset  . Arthritis Mother   . Hyperlipidemia Mother   . Hypertension Mother   . Arthritis Father   . Hyperlipidemia Father   . Heart disease Father   . Stroke Father   . Hypertension Father   . Arthritis Other   . Breast cancer Other   . Diabetes Other    History  Substance Use Topics  . Smoking status: Former Smoker    Quit date: 11/23/1981  . Smokeless tobacco: Not on file  . Alcohol Use: Yes    Review of Systems  Constitutional: Negative for fatigue and unexpected weight change.  Respiratory: Negative for cough, shortness of breath and wheezing.   Cardiovascular: Negative for chest pain, palpitations and leg swelling.  Gastrointestinal: Negative for nausea, abdominal pain and diarrhea.  Neurological: Negative for dizziness, weakness, light-headedness and headaches.  Psychiatric/Behavioral: Negative for dysphoric mood. The patient is not nervous/anxious.   All other systems reviewed and are negative.       Objective:    Physical Exam BP 130/84  Pulse 76  Temp(Src) 98.6 F (37 C) (Oral)  Ht 5\' 1"  (1.549 m)  Wt 157 lb 12.8 oz (71.578 kg)  BMI 29.83 kg/m2  SpO2 93% Wt Readings from Last 3 Encounters:  09/05/13 157 lb 12.8 oz (71.578 kg)  07/26/13 157 lb (71.215 kg)  03/20/13 157 lb 12.8 oz (71.578 kg)   Constitutional: She is overweight, but appears well-developed and well-nourished. No distress.  HENT: Head: Normocephalic and atraumatic. Ears: B TMs ok, no erythema or effusion; Nose: Nose normal. Mouth/Throat: Oropharynx is clear and moist. No oropharyngeal exudate.  Eyes: Conjunctivae and EOM are normal. Pupils are equal, round, and reactive to light. No scleral icterus.  Neck: Normal range of motion. Neck supple. No JVD present. No thyromegaly present.  Cardiovascular: Normal rate, regular rhythm and normal heart sounds.  No murmur heard. No BLE edema. Pulmonary/Chest: Effort normal and breath sounds normal. No respiratory distress. She has no wheezes.  Abdominal: Soft. Bowel sounds are normal. She exhibits no distension. There is no tenderness. no masses Musculoskeletal: Normal range of motion, no joint effusions. No gross deformities Neurological: She is alert and oriented to person, place, and time. No cranial nerve deficit. Coordination, balance, strength, speech and gait are normal.  Skin: Rosacea on face -Skin is warm and dry. No rash noted. No erythema.  Psychiatric: She has a normal mood and affect. Her behavior is normal. Judgment and thought content normal.   Lab Results  Component  Value Date   WBC 8.5 03/30/2013   HGB 13.3 03/30/2013   HCT 38.7 03/30/2013   PLT 235.0 03/30/2013   GLUCOSE 87 03/30/2013   CHOL 146 03/30/2013   TRIG 99.0 03/30/2013   HDL 46.40 03/30/2013   LDLCALC 80 03/30/2013   ALT 26 03/30/2013   AST 21 03/30/2013   NA 136 03/30/2013   K 3.3* 03/30/2013   CL 102 03/30/2013   CREATININE 0.6 03/30/2013   BUN 12 03/30/2013   CO2 28 03/30/2013   TSH 4.58 03/30/2013       Assessment & Plan:    CPX/v70.0 - Patient has been counseled on age-appropriate routine health concerns for screening and prevention. These are reviewed and up-to-date. Immunizations are up-to-date or declined. Labs ordered and reviewed.  Also see problem list. Medications and labs reviewed today.

## 2013-09-05 NOTE — Assessment & Plan Note (Signed)
previous trials Lyrica and celexa not well tolerated because of side effects - denies need for med intervention at this time

## 2013-09-05 NOTE — Patient Instructions (Addendum)
It was good to see you today.  Health Maintenance reviewed - all recommended immunizations and age-appropriate screenings are up-to-date. Your annual flu shot was given and/or updated today.  We have reviewed your prior records including labs and tests today  Test(s) ordered today. Your results will be released to MyChart (or called to you) after review, usually within 72hours after test completion. If any changes need to be made, you will be notified at that same time.  Medications reviewed and updated, no changes recommended at this time.   Work on lifestyle changes as discussed (low fat, low carb, increased protein diet; improved exercise efforts; weight loss) to control sugar, blood pressure and cholesterol levels and/or reduce risk of developing other medical problems. Look into [email or web address] or other type of food journal to assist you in this process.  Please schedule followup in 6 months, call sooner if problems.  Health Maintenance, Females A healthy lifestyle and preventative care can promote health and wellness.  Maintain regular health, dental, and eye exams.  Eat a healthy diet. Foods like vegetables, fruits, whole grains, low-fat dairy products, and lean protein foods contain the nutrients you need without too many calories. Decrease your intake of foods high in solid fats, added sugars, and salt. Get information about a proper diet from your caregiver, if necessary.  Regular physical exercise is one of the most important things you can do for your health. Most adults should get at least 150 minutes of moderate-intensity exercise (any activity that increases your heart rate and causes you to sweat) each week. In addition, most adults need muscle-strengthening exercises on 2 or more days a week.   Maintain a healthy weight. The body mass index (BMI) is a screening tool to identify possible weight problems. It provides an estimate of body fat based on height and weight. Your  caregiver can help determine your BMI, and can help you achieve or maintain a healthy weight. For adults 20 years and older:  A BMI below 18.5 is considered underweight.  A BMI of 18.5 to 24.9 is normal.  A BMI of 25 to 29.9 is considered overweight.  A BMI of 30 and above is considered obese.  Maintain normal blood lipids and cholesterol by exercising and minimizing your intake of saturated fat. Eat a balanced diet with plenty of fruits and vegetables. Blood tests for lipids and cholesterol should begin at age 36 and be repeated every 5 years. If your lipid or cholesterol levels are high, you are over 50, or you are a high risk for heart disease, you may need your cholesterol levels checked more frequently.Ongoing high lipid and cholesterol levels should be treated with medicines if diet and exercise are not effective.  If you smoke, find out from your caregiver how to quit. If you do not use tobacco, do not start.  If you are pregnant, do not drink alcohol. If you are breastfeeding, be very cautious about drinking alcohol. If you are not pregnant and choose to drink alcohol, do not exceed 1 drink per day. One drink is considered to be 12 ounces (355 mL) of beer, 5 ounces (148 mL) of wine, or 1.5 ounces (44 mL) of liquor.  Avoid use of street drugs. Do not share needles with anyone. Ask for help if you need support or instructions about stopping the use of drugs.  High blood pressure causes heart disease and increases the risk of stroke. Blood pressure should be checked at least every 1 to 2 years. Ongoing  high blood pressure should be treated with medicines, if weight loss and exercise are not effective.  If you are 68 to 53 years old, ask your caregiver if you should take aspirin to prevent strokes.  Diabetes screening involves taking a blood sample to check your fasting blood sugar level. This should be done once every 3 years, after age 57, if you are within normal weight and without risk  factors for diabetes. Testing should be considered at a younger age or be carried out more frequently if you are overweight and have at least 1 risk factor for diabetes.  Breast cancer screening is essential preventative care for women. You should practice "breast self-awareness." This means understanding the normal appearance and feel of your breasts and may include breast self-examination. Any changes detected, no matter how small, should be reported to a caregiver. Women in their 73s and 30s should have a clinical breast exam (CBE) by a caregiver as part of a regular health exam every 1 to 3 years. After age 16, women should have a CBE every year. Starting at age 58, women should consider having a mammogram (breast X-ray) every year. Women who have a family history of breast cancer should talk to their caregiver about genetic screening. Women at a high risk of breast cancer should talk to their caregiver about having an MRI and a mammogram every year.  The Pap test is a screening test for cervical cancer. Women should have a Pap test starting at age 71. Between ages 36 and 88, Pap tests should be repeated every 2 years. Beginning at age 57, you should have a Pap test every 3 years as long as the past 3 Pap tests have been normal. If you had a hysterectomy for a problem that was not cancer or a condition that could lead to cancer, then you no longer need Pap tests. If you are between ages 15 and 23, and you have had normal Pap tests going back 10 years, you no longer need Pap tests. If you have had past treatment for cervical cancer or a condition that could lead to cancer, you need Pap tests and screening for cancer for at least 20 years after your treatment. If Pap tests have been discontinued, risk factors (such as a new sexual partner) need to be reassessed to determine if screening should be resumed. Some women have medical problems that increase the chance of getting cervical cancer. In these cases, your  caregiver may recommend more frequent screening and Pap tests.  The human papillomavirus (HPV) test is an additional test that may be used for cervical cancer screening. The HPV test looks for the virus that can cause the cell changes on the cervix. The cells collected during the Pap test can be tested for HPV. The HPV test could be used to screen women aged 72 years and older, and should be used in women of any age who have unclear Pap test results. After the age of 87, women should have HPV testing at the same frequency as a Pap test.  Colorectal cancer can be detected and often prevented. Most routine colorectal cancer screening begins at the age of 5 and continues through age 22. However, your caregiver may recommend screening at an earlier age if you have risk factors for colon cancer. On a yearly basis, your caregiver may provide home test kits to check for hidden blood in the stool. Use of a small camera at the end of a tube, to directly  examine the colon (sigmoidoscopy or colonoscopy), can detect the earliest forms of colorectal cancer. Talk to your caregiver about this at age 26, when routine screening begins. Direct examination of the colon should be repeated every 5 to 10 years through age 38, unless early forms of pre-cancerous polyps or small growths are found.  Hepatitis C blood testing is recommended for all people born from [birth year] through 1965 and any individual with known risks for hepatitis C.  Practice safe sex. Use condoms and avoid high-risk sexual practices to reduce the spread of sexually transmitted infections (STIs). Sexually active women aged 102 and younger should be checked for Chlamydia, which is a common sexually transmitted infection. Older women with new or multiple partners should also be tested for Chlamydia. Testing for other STIs is recommended if you are sexually active and at increased risk.  Osteoporosis is a disease in which the bones lose minerals and strength with  aging. This can result in serious bone fractures. The risk of osteoporosis can be identified using a bone density scan. Women ages 55 and over and women at risk for fractures or osteoporosis should discuss screening with their caregivers. Ask your caregiver whether you should be taking a calcium supplement or vitamin D to reduce the rate of osteoporosis.  Menopause can be associated with physical symptoms and risks. Hormone replacement therapy is available to decrease symptoms and risks. You should talk to your caregiver about whether hormone replacement therapy is right for you.  Use sunscreen with a sun protection factor (SPF) of 30 or greater. Apply sunscreen liberally and repeatedly throughout the day. You should seek shade when your shadow is shorter than you. Protect yourself by wearing long sleeves, pants, a wide-brimmed hat, and sunglasses year round, whenever you are outdoors.  Notify your caregiver of new moles or changes in moles, especially if there is a change in shape or color. Also notify your caregiver if a mole is larger than the size of a pencil eraser.  Stay current with your immunizations. Document Released: 05/25/2011 Document Revised: 02/01/2012 Document Reviewed: 05/25/2011 [facility] Patient Information 2014 [place], [place].

## 2013-09-05 NOTE — Progress Notes (Signed)
Pre-visit discussion using our clinic review tool. No additional management support is needed unless otherwise documented below in the visit note.  

## 2013-09-05 NOTE — Assessment & Plan Note (Signed)
BP Readings from Last 3 Encounters:  09/05/13 130/84  07/26/13 124/90  03/20/13 118/84   The current medical regimen is effective;  continue present plan and medications.

## 2013-11-03 ENCOUNTER — Encounter: Payer: Self-pay | Admitting: Nurse Practitioner

## 2013-11-03 ENCOUNTER — Ambulatory Visit (INDEPENDENT_AMBULATORY_CARE_PROVIDER_SITE_OTHER): Payer: BC Managed Care – PPO | Admitting: Nurse Practitioner

## 2013-11-03 VITALS — BP 118/82 | HR 85 | Temp 98.6°F | Wt 157.4 lb

## 2013-11-03 DIAGNOSIS — J069 Acute upper respiratory infection, unspecified: Secondary | ICD-10-CM

## 2013-11-03 NOTE — Progress Notes (Signed)
   Subjective:    Patient ID: Kayla Sanders, female    DOB: 1961-11-10, 52 y.o.   MRN: [id number]  HPI Comments: Pt report onset nasal congestion & sore throat 5 days ago, then sudden onset L-sided facial pain behind L eye after using flonase. Pain/pressure has persisted.   Sinusitis This is a new problem. The current episode started in the past 7 days (5d). The problem has been gradually worsening since onset. There has been no fever. The pain is moderate. Associated symptoms include congestion, coughing, sinus pressure and a sore throat (resolved). Pertinent negatives include no chills, ear pain, headaches or shortness of breath. Past treatments include saline sprays. The treatment provided no relief.      Review of Systems  Constitutional: Negative for fever, chills and fatigue.  HENT: Positive for congestion, sinus pressure and sore throat (resolved). Negative for ear pain and hearing loss.   Eyes: Positive for pain (left) and discharge (clear).  Respiratory: Positive for cough. Negative for chest tightness, shortness of breath and wheezing.   Cardiovascular: Negative for chest pain.  Musculoskeletal: Negative for back pain.  Neurological: Negative for dizziness, weakness, light-headedness and headaches.  Hematological: Positive for adenopathy.       Objective:   Physical Exam  Vitals reviewed. Constitutional: She is oriented to person, place, and time. She appears well-developed and well-nourished. No distress.  HENT:  Head: Normocephalic and atraumatic.  Right Ear: External ear normal.  Left Ear: External ear normal. Tympanic membrane is not injected, not retracted and not bulging. A middle ear effusion (clear fluid) is present.  Mouth/Throat: Oropharynx is clear and moist. No oropharyngeal exudate.  R nasal mucosa erythematous, clear drainage. L mucosa is pale. Pt has Hx of surgery for deviated septum L side.  Eyes: Pupils are equal, round, and reactive to light. Right  eye exhibits no discharge. Left eye exhibits no discharge.  Mild scleral injection, bilateral  Neck: Normal range of motion. Neck supple. No thyromegaly present.  Cardiovascular: Normal rate, regular rhythm and normal heart sounds.   Pulmonary/Chest: Effort normal and breath sounds normal. No respiratory distress. She has no wheezes.  Lymphadenopathy:    She has no cervical adenopathy.  Neurological: She is alert and oriented to person, place, and time.  Skin: Skin is warm and dry.  Psychiatric: She has a normal mood and affect. Her behavior is normal. Thought content normal.          Assessment & Plan:  1. Acute upper respiratory infections of unspecified site Viral sinusitis. Pt hesitant to use sinus rinse, encouraged bottle method rather than netty pot. Affrin for 3 days, off 2, then resume for 3 days if needed. See pt instructions.

## 2013-11-03 NOTE — Patient Instructions (Signed)
You have a cold virus causing your symptoms. The average duration of cold symptoms is 14 days. Start daily sinus rinses (neilmed Sinus Rinse). You may add 2-3 drops affrin in mixture. Sip fluids every hour. Rest. If you are not feeling better in 1 week or develop fever or chest pain, call us for re-evaluation. Feel better!  Upper Respiratory Infection, Adult An upper respiratory infection (URI) is also sometimes known as the common cold. The upper respiratory tract includes the nose, sinuses, throat, trachea, and bronchi. Bronchi are the airways leading to the lungs. Most people improve within 1 week, but symptoms can last up to 2 weeks. A residual cough may last even longer.  CAUSES Many different viruses can infect the tissues lining the upper respiratory tract. The tissues become irritated and inflamed and often become very moist. Mucus production is also common. A cold is contagious. You can easily spread the virus to others by oral contact. This includes kissing, sharing a glass, coughing, or sneezing. Touching your mouth or nose and then touching a surface, which is then touched by another person, can also spread the virus. SYMPTOMS  Symptoms typically develop 1 to 3 days after you come in contact with a cold virus. Symptoms vary from person to person. They may include:  Runny nose.  Sneezing.  Nasal congestion.  Sinus irritation.  Sore throat.  Loss of voice (laryngitis).  Cough.  Fatigue.  Muscle aches.  Loss of appetite.  Headache.  Low-grade fever. DIAGNOSIS  You might diagnose your own cold based on familiar symptoms, since most people get a cold 2 to 3 times a year. Your caregiver can confirm this based on your exam. Most importantly, your caregiver can check that your symptoms are not due to another disease such as strep throat, sinusitis, pneumonia, asthma, or epiglottitis. Blood tests, throat tests, and X-rays are not necessary to diagnose a common cold, but they may  sometimes be helpful in excluding other more serious diseases. Your caregiver will decide if any further tests are required. RISKS AND COMPLICATIONS  You may be at risk for a more severe case of the common cold if you smoke cigarettes, have chronic heart disease (such as heart failure) or lung disease (such as asthma), or if you have a weakened immune system. The very young and very old are also at risk for more serious infections. Bacterial sinusitis, middle ear infections, and bacterial pneumonia can complicate the common cold. The common cold can worsen asthma and chronic obstructive pulmonary disease (COPD). Sometimes, these complications can require emergency medical care and may be life-threatening. PREVENTION  The best way to protect against getting a cold is to practice good hygiene. Avoid oral or hand contact with people with cold symptoms. Wash your hands often if contact occurs. There is no clear evidence that vitamin C, vitamin E, echinacea, or exercise reduces the chance of developing a cold. However, it is always recommended to get plenty of rest and practice good nutrition. TREATMENT  Treatment is directed at relieving symptoms. There is no cure. Antibiotics are not effective, because the infection is caused by a virus, not by bacteria. Treatment may include:  Increased fluid intake. Sports drinks offer valuable electrolytes, sugars, and fluids.  Breathing heated mist or steam (vaporizer or shower).  Eating chicken soup or other clear broths, and maintaining good nutrition.  Getting plenty of rest.  Using gargles or lozenges for comfort.  Controlling fevers with ibuprofen or acetaminophen as directed by your caregiver.  Increasing  usage of your inhaler if you have asthma. Zinc gel and zinc lozenges, taken in the first 24 hours of the common cold, can shorten the duration and lessen the severity of symptoms. Pain medicines may help with fever, muscle aches, and throat pain. A  variety of non-prescription medicines are available to treat congestion and runny nose. Your caregiver can make recommendations and may suggest nasal or lung inhalers for other symptoms.  HOME CARE INSTRUCTIONS   Only take over-the-counter or prescription medicines for pain, discomfort, or fever as directed by your caregiver.  Use a warm mist humidifier or inhale steam from a shower to increase air moisture. This may keep secretions moist and make it easier to breathe.  Drink enough water and fluids to keep your urine clear or pale yellow.  Rest as needed.  Return to work when your temperature has returned to normal or as your caregiver advises. You may need to stay home longer to avoid infecting others. You can also use a face mask and careful hand washing to prevent spread of the virus. SEEK MEDICAL CARE IF:   After the first few days, you feel you are getting worse rather than better.  You need your caregiver's advice about medicines to control symptoms.  You develop chills, worsening shortness of breath, or brown or red sputum. These may be signs of pneumonia.  You develop yellow or brown nasal discharge or pain in the face, especially when you bend forward. These may be signs of sinusitis.  You develop a fever, swollen neck glands, pain with swallowing, or white areas in the back of your throat. These may be signs of strep throat. SEEK IMMEDIATE MEDICAL CARE IF:   You have a fever.  You develop severe or persistent headache, ear pain, sinus pain, or chest pain.  You develop wheezing, a prolonged cough, cough up blood, or have a change in your usual mucus (if you have chronic lung disease).  You develop sore muscles or a stiff neck. Document Released: 05/05/2001 Document Revised: 02/01/2012 Document Reviewed: 03/13/2011 Exit[employer] Patient Information 2014 [employer].  

## 2013-11-03 NOTE — Progress Notes (Signed)
Pre-visit discussion using our clinic review tool. No additional management support is needed unless otherwise documented below in the visit note.  

## 2013-11-13 LAB — HM MAMMOGRAPHY

## 2013-11-17 ENCOUNTER — Encounter: Payer: Self-pay | Admitting: Internal Medicine

## 2014-12-25 ENCOUNTER — Other Ambulatory Visit: Payer: Self-pay | Admitting: Obstetrics and Gynecology

## 2014-12-27 LAB — CYTOLOGY - PAP

## 2015-11-11 ENCOUNTER — Encounter: Payer: Self-pay | Admitting: Neurology

## 2015-11-11 ENCOUNTER — Ambulatory Visit (INDEPENDENT_AMBULATORY_CARE_PROVIDER_SITE_OTHER): Payer: BLUE CROSS/BLUE SHIELD | Admitting: Neurology

## 2015-11-11 VITALS — BP 138/90 | HR 70 | Resp 16 | Ht 61.0 in | Wt 169.0 lb

## 2015-11-11 DIAGNOSIS — R51 Headache: Secondary | ICD-10-CM

## 2015-11-11 DIAGNOSIS — G4719 Other hypersomnia: Secondary | ICD-10-CM

## 2015-11-11 DIAGNOSIS — E669 Obesity, unspecified: Secondary | ICD-10-CM | POA: Diagnosis not present

## 2015-11-11 DIAGNOSIS — R0683 Snoring: Secondary | ICD-10-CM

## 2015-11-11 DIAGNOSIS — R519 Headache, unspecified: Secondary | ICD-10-CM

## 2015-11-11 NOTE — Progress Notes (Signed)
Subjective:    Patient ID: Kayla Sanders is a 54 y.o. female.  HPI     Kayla Age, MD, PhD [facility] [address], [address] [place], [place] [address]  Dear Dr. Darron Doom,   I saw your patient, Kayla Sanders, upon your kind request in my neurologic clinic today for initial consultation of her sleep disorder, in particular, concern for underlying obstructive sleep apnea. The patient is unaccompanied today. As you know, Kayla Sanders is a 54 year old right-handed woman with an underlying medical history of asthma, vitamin D deficiency, seasonal allergies, hyperlipidemia, hypertension, mitral valve prolapse, reflux disease, migraines, anxiety, obesity and fibromyalgia, who reports snoring and excessive daytime somnolence.  I reviewed your office note from 10/08/2015, which you kindly included. Of note, she had a previous sleep study at [facility] on 12/30/2001 which showed very mild obstructive sleep apnea and light snoring with normal oxygenation according to the report. RDI was 8 per hour. She had no PLMS. I reviewed the report. Her weight was 148 lb at the time (today at 169 lb).  She reports an approximately 20 pound weight gain this year. She has had a lot of stress. Her daughter has been diagnosed with autonomic dysfunction and her father has advanced dementia. Her daughter lives in [place] and her parents live in [place]. She has been traveling back and forth to [place] in [place] a lot this year. Both parents have sleep apnea and uses CPAP machine, her mother has recently changed to oxygen only as she needs to hear her father at night. The patient reports morning headaches occasionally. She has no significant nocturia. She tries to keep a bedtime of 11 PM but has trouble going to sleep and staying asleep. She has been taking Xanax or clonazepam at night for sleep. She has previously tried Ambien, Lunesta, and Rozerem for sleep with limited or  no success. 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 She quit smoking in 1983, she does not drink alcohol regularly and drinks about 3 cups of coffee per day, latest at lunch. She does not typically drink sodas or tea. She lives at home with her husband. She has 1 son and 2 daughters, all grown. She denies restless leg symptoms or leg twitching at night. Her Epworth sleepiness score is 12 out of 24 today, her fatigue score is 45 out of 63 today.  Her Past Medical History Is Significant For: Past Medical History  Diagnosis Date  . Fibromyalgia   . Asthma   . Hyperlipidemia   . Hypertension   . Mitral valve prolapse   . GERD (gastroesophageal reflux disease)   . Migraines   . Anxiety   . Anxiety   . Insomnia   . Vitamin D deficiency     Her Past Surgical History Is Significant For: Past Surgical History  Procedure Laterality Date  . Tmj arthroplasty      and removal  . Cholecystectomy    . Vesicovaginal fistula closure w/ tah    . Hemorrhoidectomy with hemorrhoid banding    . Rectocele repair    . Abdominal hysterectomy    . Nasal septum surgery    . Carpal tunnel release    . Tubal ligation    . Esophagogastroduodenoscopy N/A 02/06/2013    Procedure: ESOPHAGOGASTRODUODENOSCOPY (EGD);  Surgeon: Juanita Craver, MD;  Location: [facility]COPY;  Service: Endoscopy;  Laterality: N/A;  . Bravo ph study N/A 02/06/2013  Procedure: BRAVO [place];  Surgeon: Juanita Craver, MD;  Location: [facility];  Service: Endoscopy;  Laterality: N/A;    Her Family History Is Significant For: Family History  Problem Relation Sanders of Onset  . Arthritis Mother   . Hyperlipidemia Mother   . Hypertension Mother   . Sleep apnea Mother   . Arthritis Father   . Hyperlipidemia Father   . Heart disease Father   . Stroke Father   . Hypertension Father   . Sleep apnea Father   . Arthritis Other   . Breast cancer Other   . Diabetes Other   . Sleep apnea Brother     Her Social  History Is Significant For: Social History   Social History  . Marital Status: Married    Spouse Name: N/A  . Number of Children: 3  . Years of Education: BS   Occupational History  . [employer]    Social History Main Topics  . Smoking status: Former Smoker    Quit date: 11/23/1981  . Smokeless tobacco: None  . Alcohol Use: 0.0 oz/week    0 Standard drinks or equivalent per week     Comment: Rare  . Drug Use: No  . Sexual Activity: Not Asked   Other Topics Concern  . None   Social History Narrative   Married with grown children. Works for [employer] as a Teacher, early years/pre. Nonsmoker, quit in 1983. Rare social Etoh.   Consumes about 3+ caffeine drinks a day     Her Allergies Are:  Allergies  Allergen Reactions  . Pseudoephedrine Hcl Palpitations    Tachycardia  . Ephedrine     palpitations   :   Her Current Medications Are:  Outpatient Encounter Prescriptions as of 11/11/2015  Medication Sig  . ALPRAZolam (XANAX) 0.5 MG tablet Take 1 tablet (0.5 mg total) by mouth 3 (three) times daily as needed for sleep or anxiety.  . bifidobacterium infantis (ALIGN) capsule Take 1 capsule by mouth daily.  . cholecalciferol (VITAMIN D) 400 UNITS TABS Take 400 Units by mouth daily.  . clonazePAM (KLONOPIN) 0.5 MG tablet Take 1 tablet (0.5 mg total) by mouth 3 (three) times daily as needed.  Marland Kitchen co-enzyme Q-10 30 MG capsule Take 300 mg by mouth daily.  Marland Kitchen dexlansoprazole (DEXILANT) 60 MG capsule Take 60 mg by mouth daily.  . famotidine (PEPCID) 10 MG tablet Take 20 mg by mouth at bedtime as needed.   . hydrochlorothiazide (HYDRODIURIL) 25 MG tablet Take 1 tablet (25 mg total) by mouth daily.  Javier Docker Oil 500 MG CAPS Take 500 mg by mouth daily.  . montelukast (SINGULAIR) 10 MG tablet Take 1 tablet (10 mg total) by mouth at bedtime.  . simvastatin (ZOCOR) 20 MG tablet Take 1 tablet (20 mg total) by mouth every evening.  . valACYclovir (VALTREX) 500 MG tablet Take 500 mg by mouth as needed.   . valsartan (DIOVAN) 80 MG tablet Take 1 tablet (80 mg total) by mouth daily.   No facility-administered encounter medications on file as of 11/11/2015.  :  Review of Systems:  Out of a complete 14 point review of systems, all are reviewed and negative with the exception of these symptoms as listed below:   Review of Systems  Constitutional: Positive for fatigue.       Weight gain   Endocrine:       Flushing   Musculoskeletal:       Aching muscles   Allergic/Immunologic: Positive for environmental  allergies.  Neurological: Positive for headaches.       Patient has trouble falling asleep, some trouble staying asleep, snoring, wakes up in the night coughing, wakes up feeling tired, morning headaches, daytime tiredness, denies taking naps.    Epworth Sleepiness Scale 0= would never doze 1= slight chance of dozing 2= moderate chance of dozing 3= high chance of dozing  Sitting and reading:2 Watching TV:2 Sitting inactive in a public place (ex. Theater or meeting):2 As a passenger in a car for an hour without a break:2 Lying down to rest in the afternoon:1 Sitting and talking to someone:1 Sitting quietly after lunch (no alcohol):2 In a car, while stopped in traffic:0 Total:12  Objective:  Neurologic Exam  Physical Exam Physical Examination:   Filed Vitals:   11/11/15 1316  BP: 138/90  Pulse: 70  Resp: 16   General Examination: The patient is a very pleasant 54 y.o. female in no acute distress. She appears well-developed and well-nourished and well groomed.   HEENT: Normocephalic, atraumatic, pupils are equal, round and reactive to light and accommodation. Funduscopic exam is normal with sharp disc margins noted. Extraocular tracking is good without limitation to gaze excursion or nystagmus noted. Normal smooth pursuit is noted. Hearing is grossly intact. Tympanic membranes are clear bilaterally. Face is symmetric with normal facial animation and normal facial sensation.  Speech is clear with no dysarthria noted. There is no hypophonia. There is no lip, neck/head, jaw or voice tremor. Neck is supple with full range of passive and active motion. There are no carotid bruits on auscultation. Oropharynx exam reveals: mild mouth dryness, good dental hygiene and marked airway crowding, due to narrow airway entry and larger uvula and tonsils. Mallampati is class III. Tongue protrudes centrally and palate elevates symmetrically. Tonsils are 2+ in size. Neck size is 15.25 inches. She has a Moderate overbite. Nasal inspection reveals no significant nasal mucosal bogginess or redness and no septal deviation.   Chest: Clear to auscultation without wheezing, rhonchi or crackles noted.  Heart: S1+S2+0, regular and normal without murmurs, rubs or gallops noted.   Abdomen: Soft, non-tender and non-distended with normal bowel sounds appreciated on auscultation.  Extremities: There is no pitting edema in the distal lower extremities bilaterally. Pedal pulses are intact.  Skin: Warm and dry without trophic changes noted. There are no varicose veins.  Musculoskeletal: exam reveals no obvious joint deformities, tenderness or joint swelling or erythema.   Neurologically:  Mental status: The patient is awake, alert and oriented in all 4 spheres. Her immediate and remote memory, attention, language skills and fund of knowledge are appropriate. There is no evidence of aphasia, agnosia, apraxia or anomia. Speech is clear with normal prosody and enunciation. Thought process is linear. Mood is normal and affect is normal.  Cranial nerves II - XII are as described above under HEENT exam. In addition: shoulder shrug is normal with equal shoulder height noted. Motor exam: Normal bulk, strength and tone is noted. There is no drift, tremor or rebound. Romberg is negative. Reflexes are 2+ throughout. Babinski: Toes are flexor bilaterally. Fine motor skills and coordination: intact with normal finger  taps, normal hand movements, normal rapid alternating patting, normal foot taps and normal foot agility.  Cerebellar testing: No dysmetria or intention tremor on finger to nose testing. Heel to shin is unremarkable bilaterally. There is no truncal or gait ataxia.  Sensory exam: intact to light touch, pinprick, vibration, temperature sense in the upper and lower extremities.  Gait, station and  balance: She stands easily. No veering to one side is noted. No leaning to one side is noted. Posture is Sanders-appropriate and stance is narrow based. Gait shows normal stride length and normal pace. No problems turning are noted. She turns en bloc. Tandem walk is unremarkable.   Assessment and Plan:   In summary, Kayla Sanders is a very pleasant 54 y.o.-year old female with an underlying medical history of asthma, vitamin D deficiency, seasonal allergies, hyperlipidemia, hypertension, mitral valve prolapse, reflux disease, migraines, anxiety, obesity and fibromyalgia, whose history and physical exam are concerning for obstructive sleep apnea (OSA). I had a long chat with the patient about my findings and the diagnosis of OSA, its prognosis and treatment options. We talked about medical treatments, surgical interventions and non-pharmacological approaches. I explained in particular the risks and ramifications of untreated moderate to severe OSA, especially with respect to developing cardiovascular disease down the Road, including congestive heart failure, difficult to treat hypertension, cardiac arrhythmias, or stroke. Even type 2 diabetes has, in part, been linked to untreated OSA. Symptoms of untreated OSA include daytime sleepiness, memory problems, mood irritability and mood disorder such as depression and anxiety, lack of energy, as well as recurrent headaches, especially morning headaches. We talked about trying to maintain a healthy lifestyle in general, as well as the importance of weight control. I  encouraged the patient to eat healthy, exercise daily and keep well hydrated, to keep a scheduled bedtime and wake time routine, to not skip any meals and eat healthy snacks in between meals. I advised the patient not to drive when feeling sleepy.  I recommended the following at this time: sleep study with potential positive airway pressure titration. (We will score hypopneas at 3% and split the sleep study into diagnostic and treatment portion, if the estimated. 2 hour AHI is >15/h).   I explained the sleep test procedure to the patient and also outlined possible surgical and non-surgical treatment options of OSA, including the use of a custom-made dental device (which would require a referral to a specialist dentist or oral surgeon), upper airway surgical options, such as pillar implants, radiofrequency surgery, tongue base surgery, and UPPP (which would involve a referral to an ENT surgeon). Rarely, jaw surgery such as mandibular advancement may be considered.  I also explained the CPAP treatment option to the patient, who indicated that sheshe reports she would be willing to try CPAP if the need arises. I explained the importance of being compliant with PAP treatment, not only for insurance purposes but primarily to improve Her symptoms, and for the patient's long term health benefit, including to reduce Her cardiovascular risks. I answered all her questions today and the patient was in agreement. I would like to see her back after the sleep study is completed and encouraged her to call with any interim questions, concerns, problems or updates.   Thank you very much for allowing me to participate in the care of this nice patient. If I can be of any further assistance to you please do not hesitate to call me at [phone].  Sincerely,   Kayla Age, MD, PhD

## 2015-11-11 NOTE — Patient Instructions (Signed)

## 2016-01-01 ENCOUNTER — Ambulatory Visit (HOSPITAL_BASED_OUTPATIENT_CLINIC_OR_DEPARTMENT_OTHER): Payer: Self-pay

## 2016-09-01 ENCOUNTER — Other Ambulatory Visit (HOSPITAL_BASED_OUTPATIENT_CLINIC_OR_DEPARTMENT_OTHER): Payer: Self-pay

## 2016-09-01 DIAGNOSIS — R0683 Snoring: Secondary | ICD-10-CM

## 2016-09-01 DIAGNOSIS — R5383 Other fatigue: Secondary | ICD-10-CM

## 2016-09-01 DIAGNOSIS — G4730 Sleep apnea, unspecified: Secondary | ICD-10-CM

## 2016-09-07 ENCOUNTER — Other Ambulatory Visit: Payer: Self-pay | Admitting: Family Medicine

## 2016-09-07 DIAGNOSIS — I6529 Occlusion and stenosis of unspecified carotid artery: Secondary | ICD-10-CM

## 2016-09-10 ENCOUNTER — Ambulatory Visit (HOSPITAL_BASED_OUTPATIENT_CLINIC_OR_DEPARTMENT_OTHER): Payer: BLUE CROSS/BLUE SHIELD | Attending: Family Medicine | Admitting: Internal Medicine

## 2016-09-10 DIAGNOSIS — G4730 Sleep apnea, unspecified: Secondary | ICD-10-CM

## 2016-09-10 DIAGNOSIS — R5383 Other fatigue: Secondary | ICD-10-CM

## 2016-09-10 DIAGNOSIS — R0683 Snoring: Secondary | ICD-10-CM | POA: Diagnosis present

## 2016-09-10 DIAGNOSIS — G4733 Obstructive sleep apnea (adult) (pediatric): Secondary | ICD-10-CM | POA: Insufficient documentation

## 2016-09-13 DIAGNOSIS — R0683 Snoring: Secondary | ICD-10-CM

## 2016-09-13 NOTE — Procedures (Signed)
Patient Name: Kayla Sanders, Kayla Sanders Date: 09/10/2016 Gender: Female D.O.B: Aug 15, 1961 Age (years): 55 Referring Provider: Horald Pollen Height (inches): 61 Interpreting Physician: Baird Lyons MD, ABSM Weight (lbs): 170 RPSGT: Earney Hamburg BMI: 32 MRN: [id number] Neck Size: 15.00 CLINICAL INFORMATION Sleep Study Type: Split Night CPAP Indication for sleep study: Fatigue, Snoring, Witnessed Apneas Epworth Sleepiness Score: 15  SLEEP STUDY TECHNIQUE As per the AASM Manual for the Scoring of Sleep and Associated Events v2.3 (April 2016) with a hypopnea requiring 4% desaturations. The channels recorded and monitored were frontal, central and occipital EEG, electrooculogram (EOG), submentalis EMG (chin), nasal and oral airflow, thoracic and abdominal wall motion, anterior tibialis EMG, snore microphone, electrocardiogram, and pulse oximetry. Continuous positive airway pressure (CPAP) was initiated when the patient met split night criteria and was titrated according to treat sleep-disordered breathing.  MEDICATIONS Medications self-administered by patient taken the night of the study : DIOVAN, PEPCID, SINGULAIR, ALLEGRA, XANAX  RESPIRATORY PARAMETERS Diagnostic Total AHI (/hr): 17.1 RDI (/hr): 27.7 OA Index (/hr): 6 CA Index (/hr): 0.9 REM AHI (/hr): 48.0 NREM AHI (/hr): 12.8 Supine AHI (/hr): 17.1 Non-supine AHI (/hr): N/A Min O2 Sat (%): 88.00 Mean O2 (%): 94.86 Time below 88% (min): 0.2   Titration Optimal Pressure (cm): 12 AHI at Optimal Pressure (/hr): 0.0 Min O2 at Optimal Pressure (%): 94.0 Supine % at Optimal (%): 100 Sleep % at Optimal (%): 98    SLEEP ARCHITECTURE The recording time for the entire night was 402.1 minutes. During a baseline period of 217.1 minutes, the patient slept for 129.8 minutes in REM and nonREM, yielding a sleep efficiency of 59.8%. Sleep onset after lights out was 81.2 minutes with a REM latency of 103.0 minutes. The patient spent 2.31% of  the night in stage N1 sleep, 75.74% in stage N2 sleep, 8.47% in stage N3 and 13.48% in REM. During the titration period of 170.9 minutes, the patient slept for 123.3 minutes in REM and nonREM, yielding a sleep efficiency of 72.1%. Sleep onset after CPAP initiation was 22.1 minutes with a REM latency of 91.0 minutes. The patient spent 4.05% of the night in stage N1 sleep, 63.11% in stage N2 sleep, 0.00% in stage N3 and 32.84% in REM.  CARDIAC DATA The 2 lead EKG demonstrated sinus rhythm. The mean heart rate was 72.18 beats per minute. Other EKG findings include: None.  LEG MOVEMENT DATA The total Periodic Limb Movements of Sleep (PLMS) were 0. The PLMS index was 0.00 .  IMPRESSIONS - Moderate obstructive sleep apnea occurred during the diagnostic portion of the study(AHI = 17.1/hour). An optimal PAP pressure was selected for this patient ( 12 cm of water) - No significant central sleep apnea occurred during the diagnostic portion of the study (CAI = 0.9/hour). - The patient had minimal or no oxygen desaturation during the diagnostic portion of the study (Min O2 = 88.00%) - The patient snored with Loud snoring volume during the diagnostic portion of the study. - No cardiac abnormalities were noted during this study. - Clinically significant periodic limb movements did not occur during sleep.  DIAGNOSIS - Obstructive Sleep Apnea (327.23 [G47.33 ICD-10])  RECOMMENDATIONS - Trial of CPAP therapy on 12 cm H2O with a Small size Fisher&Paykel Full Face Mask Simplus mask and heated humidification. - Avoid alcohol, sedatives and other CNS depressants that may worsen sleep apnea and disrupt normal sleep architecture. - Sleep hygiene should be reviewed to assess factors that may improve sleep quality. - Weight management and regular exercise should  be initiated or continued.  [Electronically signed] 09/13/2016 01:42 PM  Baird Lyons MD, [place], American Board of Sleep Medicine   NPI:  [id number]  [place], American Board of Sleep Medicine  ELECTRONICALLY SIGNED ON:  09/13/2016, 1:42 PM [place] PH: [phone]   FX: [phone] [place]

## 2016-09-16 ENCOUNTER — Other Ambulatory Visit: Payer: Self-pay

## 2016-09-17 ENCOUNTER — Other Ambulatory Visit: Payer: Self-pay | Admitting: Family Medicine

## 2016-09-17 ENCOUNTER — Ambulatory Visit: Payer: BLUE CROSS/BLUE SHIELD

## 2016-09-17 ENCOUNTER — Ambulatory Visit
Admission: RE | Admit: 2016-09-17 | Discharge: 2016-09-17 | Disposition: A | Discharge status: Home / Self Care | Payer: BLUE CROSS/BLUE SHIELD | Source: Ambulatory Visit | Attending: Family Medicine | Admitting: Family Medicine

## 2016-09-17 DIAGNOSIS — Z8249 Family history of ischemic heart disease and other diseases of the circulatory system: Secondary | ICD-10-CM

## 2016-09-17 DIAGNOSIS — Z136 Encounter for screening for cardiovascular disorders: Secondary | ICD-10-CM

## 2016-09-17 DIAGNOSIS — I6529 Occlusion and stenosis of unspecified carotid artery: Secondary | ICD-10-CM

## 2016-09-18 ENCOUNTER — Other Ambulatory Visit (HOSPITAL_COMMUNITY): Payer: Self-pay | Admitting: Gastroenterology

## 2016-09-18 DIAGNOSIS — R1310 Dysphagia, unspecified: Secondary | ICD-10-CM

## 2016-09-24 ENCOUNTER — Ambulatory Visit (HOSPITAL_COMMUNITY)
Admission: RE | Admit: 2016-09-24 | Discharge: 2016-09-24 | Disposition: A | Discharge status: Home / Self Care | Payer: BLUE CROSS/BLUE SHIELD | Source: Ambulatory Visit | Attending: Gastroenterology | Admitting: Gastroenterology

## 2016-09-24 DIAGNOSIS — R1310 Dysphagia, unspecified: Secondary | ICD-10-CM | POA: Diagnosis not present

## 2016-09-25 ENCOUNTER — Ambulatory Visit: Payer: BLUE CROSS/BLUE SHIELD

## 2016-09-29 ENCOUNTER — Ambulatory Visit (HOSPITAL_BASED_OUTPATIENT_CLINIC_OR_DEPARTMENT_OTHER): Payer: Self-pay

## 2016-10-01 ENCOUNTER — Encounter (HOSPITAL_COMMUNITY): Payer: Self-pay | Admitting: *Deleted

## 2016-10-08 NOTE — H&P (Signed)
Kayla Sanders HPI: This 55 year old white female presents to the office for a 1 follow up. She currently taking Dexilant 60 mg in the morning, 2 Pepcid 20 mg, at night and Carafate on a PRN schedule for acid reflux that has worsened recently as she has been under a lot of stress. She has gas and bloating and is taking Beano with some relief. She notice the gas and bloating is worse after eating vegetables. She often has difficulty swallowing solids and liquids. She has 1-2 BM per day with no obvious blood or mucus in the stool. She has good appetite and her weight is stable. She denies any complaints of abdominal pain, nausea, vomiting, or odynophagia. She denies having a family history of colon cancer, celiac sprue, or IBD. Her last colonoscopy was done on 03/27/2011 and revealed internal hemorrhoids an a suture was noted at the anal verge; random colonic biopsies revealed unremarkable colonic biopsies with no evidence of colitis.   Past Medical History:  Diagnosis Date  . Anxiety   . Anxiety   . Asthma   . Complication of anesthesia    told by anesthesia had small airway  . Family history of adverse reaction to anesthesia    daughter has severe ponv  . Fibromyalgia   . GERD (gastroesophageal reflux disease)   . Hyperlipidemia   . Hypertension   . Insomnia   . Migraines   . Mitral valve prolapse   . PONV (postoperative nausea and vomiting)    with general anesthesia severe ponv, does ok with propofol  . Vitamin D deficiency     Past Surgical History:  Procedure Laterality Date  . ABDOMINAL HYSTERECTOMY     partial  . BRAVO [place] STUDY N/A 02/06/2013   Procedure: BRAVO [place];  Surgeon: Juanita Craver, MD;  Location: [facility];  Service: Endoscopy;  Laterality: N/A;  . CARPAL TUNNEL RELEASE Right   . CHOLECYSTECTOMY    . colonscopy  2012  . ESOPHAGOGASTRODUODENOSCOPY N/A 02/06/2013   Procedure: ESOPHAGOGASTRODUODENOSCOPY (EGD);  Surgeon: Juanita Craver, MD;  Location: [facility];   Service: Endoscopy;  Laterality: N/A;  . HEMORRHOIDECTOMY WITH HEMORRHOID BANDING    . NASAL SEPTUM SURGERY    . RECTOCELE REPAIR    . TMJ ARTHROPLASTY     and removal  . TUBAL LIGATION    . VESICOVAGINAL FISTULA CLOSURE W/ TAH      Family History  Problem Relation Age of Onset  . Arthritis Mother   . Hyperlipidemia Mother   . Hypertension Mother   . Sleep apnea Mother   . Arthritis Father   . Hyperlipidemia Father   . Heart disease Father   . Stroke Father   . Hypertension Father   . Sleep apnea Father   . Arthritis Other   . Breast cancer Other   . Diabetes Other   . Sleep apnea Brother     Social History:  reports that she quit smoking about 34 years ago. She has never used smokeless tobacco. She reports that she drinks alcohol. She reports that she does not use drugs.  Allergies:  Allergies  Allergen Reactions  . Pseudoephedrine Hcl Palpitations    Tachycardia  . Ephedrine     palpitations   . Fentanyl     Does not work well, itching pt prefers not to have    Medications: Scheduled: Continuous:  No results found for this or any previous visit (from the past 24 hour(s)).   No results found.  ROS:  As stated above in the HPI otherwise negative.  There were no vitals taken for this visit.    PE: Gen: NAD, Alert and Oriented HEENT:  [place]/AT, EOMI Neck: Supple, no LAD Lungs: CTA Bilaterally CV: RRR without M/G/R ABM: Soft, NTND, +BS Ext: No C/C/E  Assessment/Plan: 1) Dysphagia. 2) Hematochezia.  Plan: 1) EGD/Colonoscopy.  Laron Angelini D 10/08/2016, 7:50 AM

## 2016-10-09 ENCOUNTER — Ambulatory Visit (HOSPITAL_COMMUNITY): Payer: BLUE CROSS/BLUE SHIELD | Admitting: Anesthesiology

## 2016-10-09 ENCOUNTER — Ambulatory Visit (HOSPITAL_COMMUNITY)
Admission: RE | Admit: 2016-10-09 | Discharge: 2016-10-09 | Disposition: A | Discharge status: Home / Self Care | Payer: BLUE CROSS/BLUE SHIELD | Source: Ambulatory Visit | Attending: Gastroenterology | Admitting: Gastroenterology

## 2016-10-09 ENCOUNTER — Encounter (HOSPITAL_COMMUNITY): Payer: Self-pay

## 2016-10-09 ENCOUNTER — Encounter (HOSPITAL_COMMUNITY)
Admission: RE | Disposition: A | Discharge status: Home / Self Care | Payer: Self-pay | Source: Ambulatory Visit | Attending: Gastroenterology

## 2016-10-09 DIAGNOSIS — I10 Essential (primary) hypertension: Secondary | ICD-10-CM | POA: Insufficient documentation

## 2016-10-09 DIAGNOSIS — Z8261 Family history of arthritis: Secondary | ICD-10-CM | POA: Insufficient documentation

## 2016-10-09 DIAGNOSIS — K921 Melena: Secondary | ICD-10-CM | POA: Insufficient documentation

## 2016-10-09 DIAGNOSIS — M797 Fibromyalgia: Secondary | ICD-10-CM | POA: Insufficient documentation

## 2016-10-09 DIAGNOSIS — I341 Nonrheumatic mitral (valve) prolapse: Secondary | ICD-10-CM | POA: Diagnosis not present

## 2016-10-09 DIAGNOSIS — Z823 Family history of stroke: Secondary | ICD-10-CM | POA: Diagnosis not present

## 2016-10-09 DIAGNOSIS — Z87891 Personal history of nicotine dependence: Secondary | ICD-10-CM | POA: Insufficient documentation

## 2016-10-09 DIAGNOSIS — J45909 Unspecified asthma, uncomplicated: Secondary | ICD-10-CM | POA: Insufficient documentation

## 2016-10-09 DIAGNOSIS — F419 Anxiety disorder, unspecified: Secondary | ICD-10-CM | POA: Diagnosis not present

## 2016-10-09 DIAGNOSIS — Z90710 Acquired absence of both cervix and uterus: Secondary | ICD-10-CM | POA: Insufficient documentation

## 2016-10-09 DIAGNOSIS — Z8249 Family history of ischemic heart disease and other diseases of the circulatory system: Secondary | ICD-10-CM | POA: Insufficient documentation

## 2016-10-09 DIAGNOSIS — D122 Benign neoplasm of ascending colon: Secondary | ICD-10-CM | POA: Insufficient documentation

## 2016-10-09 DIAGNOSIS — R1310 Dysphagia, unspecified: Secondary | ICD-10-CM | POA: Diagnosis not present

## 2016-10-09 DIAGNOSIS — E559 Vitamin D deficiency, unspecified: Secondary | ICD-10-CM | POA: Insufficient documentation

## 2016-10-09 DIAGNOSIS — Z888 Allergy status to other drugs, medicaments and biological substances status: Secondary | ICD-10-CM | POA: Diagnosis not present

## 2016-10-09 DIAGNOSIS — Z803 Family history of malignant neoplasm of breast: Secondary | ICD-10-CM | POA: Diagnosis not present

## 2016-10-09 DIAGNOSIS — K219 Gastro-esophageal reflux disease without esophagitis: Secondary | ICD-10-CM | POA: Diagnosis not present

## 2016-10-09 DIAGNOSIS — G4700 Insomnia, unspecified: Secondary | ICD-10-CM | POA: Diagnosis not present

## 2016-10-09 DIAGNOSIS — Z1211 Encounter for screening for malignant neoplasm of colon: Secondary | ICD-10-CM | POA: Insufficient documentation

## 2016-10-09 DIAGNOSIS — E785 Hyperlipidemia, unspecified: Secondary | ICD-10-CM | POA: Diagnosis not present

## 2016-10-09 HISTORY — PX: COLONOSCOPY WITH PROPOFOL: SHX5780

## 2016-10-09 HISTORY — DX: Other specified postprocedural states: Z98.890

## 2016-10-09 HISTORY — DX: Other complications of anesthesia, initial encounter: T88.59XA

## 2016-10-09 HISTORY — DX: Family history of other specified conditions: Z84.89

## 2016-10-09 HISTORY — DX: Adverse effect of unspecified anesthetic, initial encounter: T41.45XA

## 2016-10-09 HISTORY — DX: Nausea with vomiting, unspecified: Z98.890

## 2016-10-09 HISTORY — DX: Nausea with vomiting, unspecified: R11.2

## 2016-10-09 HISTORY — PX: ESOPHAGOGASTRODUODENOSCOPY (EGD) WITH PROPOFOL: SHX5813

## 2016-10-09 SURGERY — ESOPHAGOGASTRODUODENOSCOPY (EGD) WITH PROPOFOL
Anesthesia: Monitor Anesthesia Care

## 2016-10-09 SURGERY — COLONOSCOPY WITH PROPOFOL
Anesthesia: Monitor Anesthesia Care

## 2016-10-09 MED ORDER — LACTATED RINGERS IV SOLN
INTRAVENOUS | Status: DC
  Administered 2016-10-09: 1000 mL via INTRAVENOUS

## 2016-10-09 MED ORDER — SODIUM CHLORIDE 0.9 % IV SOLN: INTRAVENOUS | Status: DC

## 2016-10-09 MED ORDER — PROPOFOL 10 MG/ML IV BOLUS
INTRAVENOUS | Status: AC
  Filled 2016-10-09: qty 20

## 2016-10-09 MED ORDER — PROPOFOL 10 MG/ML IV BOLUS
INTRAVENOUS | Status: AC
  Filled 2016-10-09: qty 40

## 2016-10-09 MED ORDER — PROPOFOL 10 MG/ML IV BOLUS
INTRAVENOUS | Status: AC
  Filled 2016-10-09: qty 60

## 2016-10-09 MED ORDER — PROPOFOL 500 MG/50ML IV EMUL
INTRAVENOUS | Status: DC | PRN
  Administered 2016-10-09: 125 ug/kg/min via INTRAVENOUS

## 2016-10-09 MED ORDER — PROPOFOL 500 MG/50ML IV EMUL
INTRAVENOUS | Status: DC | PRN
  Administered 2016-10-09 (×2): 30 mg via INTRAVENOUS

## 2016-10-09 SURGICAL SUPPLY — 25 items

## 2016-10-09 NOTE — Op Note (Signed)
[facility] Patient Name: Kayla Sanders Procedure Date: 10/09/2016 MRN: [id number] Attending MD: Carol Ada , MD Date of Birth: 25-Dec-1960 CSN: [id number] Age: 55 Admit Type: Outpatient Procedure:                Colonoscopy Indications:              Screening for colorectal malignant neoplasm Providers:                Carol Ada, MD, Dustin Flock RN, RN, Elspeth Cho Tech., Technician, Herbie Drape, CRNA Referring MD:              Medicines:                Propofol per Anesthesia Complications:            No immediate complications. Estimated Blood Loss:     Estimated blood loss: none. Estimated blood loss                            was minimal. Procedure:                Pre-Anesthesia Assessment:                           - Prior to the procedure, a History and Physical                            was performed, and patient medications and                            allergies were reviewed. The patient's tolerance of                            previous anesthesia was also reviewed. The risks                            and benefits of the procedure and the sedation                            options and risks were discussed with the patient.                            All questions were answered, and informed consent                            was obtained. Prior Anticoagulants: The patient has                            taken no previous anticoagulant or antiplatelet                            agents. ASA Grade Assessment: III - A patient with  severe systemic disease. After reviewing the risks                            and benefits, the patient was deemed in                            satisfactory condition to undergo the procedure.                           - Sedation was administered by an anesthesia                            professional. Deep sedation was attained.                           After  obtaining informed consent, the colonoscope                            was passed under direct vision. Throughout the                            procedure, the patient's blood pressure, pulse, and                            oxygen saturations were monitored continuously. The                            EC-[id number] scope was introduced through                            the anus and advanced to the the cecum, identified                            by appendiceal orifice and ileocecal valve. The                            colonoscopy was performed without difficulty. The                            patient tolerated the procedure well. The quality                            of the bowel preparation was excellent. The                            ileocecal valve, appendiceal orifice, and rectum                            were photographed. Scope In: 12:51:24 PM Scope Out: 1:07:50 PM Scope Withdrawal Time: 0 hours 13 minutes 7 seconds  Total Procedure Duration: 0 hours 16 minutes 26 seconds  Findings:      Two sessile polyps were found in the ascending colon. The polyps were 2       to 3 mm in size. These polyps were  removed with a cold snare. Resection       and retrieval were complete. Estimated blood loss was minimal.      A residual suture material was identified in the Impression:               - Two 2 to 3 mm polyps in the ascending colon,                            removed with a cold snare. Resected and retrieved. Moderate Sedation:      N/A- Per Anesthesia Care Recommendation:           - Patient has a contact number available for                            emergencies. The signs and symptoms of potential                            delayed complications were discussed with the                            patient. Return to normal activities tomorrow.                            Written discharge instructions were provided to the                            patient.                            - Resume previous diet.                           - Continue present medications.                           - Await pathology results.                           - Repeat colonoscopy in 5 years for surveillance. Procedure Code(s):        --- Professional ---                           [phone], Colonoscopy, flexible; with removal of                            tumor(s), polyp(s), or other lesion(s) by snare                            technique Diagnosis Code(s):        --- Professional ---                           Z12.11, Encounter for screening for malignant                            neoplasm of colon  D12.2, Benign neoplasm of ascending colon CPT copyright 2016 [employer]. All rights reserved. The codes documented in this report are preliminary and upon coder review may  be revised to meet current compliance requirements. Carol Ada, MD Carol Ada, MD 10/09/2016 1:24:48 PM This report has been signed electronically. Number of Addenda: 0

## 2016-10-09 NOTE — Anesthesia Postprocedure Evaluation (Signed)
Anesthesia Post Note  Patient: Kayla Sanders  Procedure(s) Performed: Procedure(s) (LRB): ESOPHAGOGASTRODUODENOSCOPY (EGD) WITH PROPOFOL (N/A) COLONOSCOPY WITH PROPOFOL (N/A)  Patient location during evaluation: PACU Anesthesia Type: MAC Level of consciousness: awake and alert Pain management: pain level controlled Vital Signs Assessment: post-procedure vital signs reviewed and stable Respiratory status: spontaneous breathing, nonlabored ventilation and respiratory function stable Cardiovascular status: stable and blood pressure returned to baseline Anesthetic complications: no    Last Vitals:  Vitals:   10/09/16 1330 10/09/16 1340  BP:    Pulse:    Resp: 13 15  Temp:      Last Pain:  Vitals:   10/09/16 1340  TempSrc:   PainSc: 2                  Mikal Wisman,W. EDMOND

## 2016-10-09 NOTE — Op Note (Signed)
[facility] Patient Name: Kayla Sanders Procedure Date: 10/09/2016 MRN: [id number] Attending MD: Carol Ada , MD Date of Birth: December 25, 1960 CSN: [id number] Age: 55 Admit Type: Outpatient Procedure:                Upper GI endoscopy Indications:              Dysphagia Providers:                Carol Ada, MD, Dustin Flock RN, RN, Elspeth Cho Tech., Technician, Herbie Drape, CRNA Referring MD:              Medicines:                Propofol per Anesthesia Complications:            No immediate complications. Estimated Blood Loss:     Estimated blood loss: none. Procedure:                Pre-Anesthesia Assessment:                           - Prior to the procedure, a History and Physical                            was performed, and patient medications and                            allergies were reviewed. The patient's tolerance of                            previous anesthesia was also reviewed. The risks                            and benefits of the procedure and the sedation                            options and risks were discussed with the patient.                            All questions were answered, and informed consent                            was obtained. Prior Anticoagulants: The patient has                            taken no previous anticoagulant or antiplatelet                            agents. ASA Grade Assessment: III - A patient with                            severe systemic disease. After reviewing the risks  and benefits, the patient was deemed in                            satisfactory condition to undergo the procedure.                           - Sedation was administered by an anesthesia                            professional. Deep sedation was attained.                           After obtaining informed consent, the endoscope was                            passed under  direct vision. Throughout the                            procedure, the patient's blood pressure, pulse, and                            oxygen saturations were monitored continuously. The                            [id number] scope was introduced through                            the mouth, and advanced to the second part of                            duodenum. The upper GI endoscopy was accomplished                            without difficulty. The patient tolerated the                            procedure well. Scope In: Scope Out: Findings:      No endoscopic abnormality was evident in the esophagus to explain the       patient's complaint of dysphagia. It was decided, however, to proceed       with dilation of the entire esophagus. A guidewire was placed and the       scope was withdrawn. Dilation was performed with a Savary dilator with       no resistance at 16 mm. Estimated blood loss: none.      The stomach was normal.      The examined duodenum was normal.      The initial attempt with an 18 mm Savary was unsuccessful. There was       marked resistance with trying to insert the dilator and dilation was not       pursued over the guidewire. A 16 mm Savary was selected and some       resistance was encountered, but it was able to be passed into the       esophagus without significant difficulty. Post dilation examination was  negative for crepitus. Impression:               - No endoscopic esophageal abnormality to explain                            patient's dysphagia. Esophagus dilated. Dilated.                           - Normal stomach.                           - Normal examined duodenum.                           - No specimens collected. Moderate Sedation:      N/A- Per Anesthesia Care Recommendation:           - Patient has a contact number available for                            emergencies. The signs and symptoms of potential                             delayed complications were discussed with the                            patient. Return to normal activities tomorrow.                            Written discharge instructions were provided to the                            patient.                           - Resume previous diet.                           - Continue present medications.                           - Follow up with Dr. Collene Mares. Procedure Code(s):        --- Professional ---                           [phone], Esophagogastroduodenoscopy, flexible,                            transoral; with insertion of guide wire followed by                            passage of dilator(s) through esophagus over guide                            wire Diagnosis Code(s):        --- Professional ---  R13.10, Dysphagia, unspecified CPT copyright 2016 American Medical Association. All rights reserved. The codes documented in this report are preliminary and upon coder review may  be revised to meet current compliance requirements. Carol Ada, MD Carol Ada, MD 10/09/2016 1:29:43 PM This report has been signed electronically. Number of Addenda: 0

## 2016-10-09 NOTE — Discharge Instructions (Signed)

## 2016-10-09 NOTE — Transfer of Care (Signed)
Immediate Anesthesia Transfer of Care Note  Patient: Kayla Sanders  Procedure(s) Performed: Procedure(s): ESOPHAGOGASTRODUODENOSCOPY (EGD) WITH PROPOFOL (N/A) COLONOSCOPY WITH PROPOFOL (N/A)  Patient Location: PACU and Endoscopy Unit  Anesthesia Type:MAC  Level of Consciousness: awake, alert , oriented and patient cooperative  Airway & Oxygen Therapy: Patient Spontanous Breathing and Patient connected to nasal cannula oxygen  Post-op Assessment: Report given to RN, Post -op Vital signs reviewed and stable and Patient moving all extremities  Post vital signs: Reviewed and stable  Last Vitals:  Vitals:   10/09/16 1144  BP: (P) 136/82  Pulse: (P) 70  Resp: (P) 16  Temp: (P) 36.9 C    Last Pain:  Vitals:   10/09/16 1144  TempSrc: (P) Oral         Complications: No apparent anesthesia complications

## 2016-10-09 NOTE — Anesthesia Preprocedure Evaluation (Addendum)
Anesthesia Evaluation  Patient identified by MRN, date of birth, ID band Patient awake    Reviewed: Allergy & Precautions, H&P , NPO status , Patient's Chart, lab work & pertinent test results  History of Anesthesia Complications (+) PONV  Airway Mallampati: III  TM Distance: >3 FB Neck ROM: Full  Mouth opening: Limited Mouth Opening  Dental no notable dental hx. (+) Teeth Intact, Dental Advisory Given   Pulmonary asthma , former smoker,    Pulmonary exam normal breath sounds clear to auscultation       Cardiovascular hypertension, Pt. on medications  Rhythm:Regular Rate:Normal     Neuro/Psych  Headaches, Anxiety negative psych ROS   GI/Hepatic Neg liver ROS, GERD  Medicated,  Endo/Other  negative endocrine ROS  Renal/GU negative Renal ROS  negative genitourinary   Musculoskeletal  (+) Fibromyalgia -  Abdominal   Peds  Hematology negative hematology ROS (+)   Anesthesia Other Findings   Reproductive/Obstetrics negative OB ROS                            Anesthesia Physical Anesthesia Plan  ASA: II  Anesthesia Plan: MAC   Post-op Pain Management:    Induction: Intravenous  Airway Management Planned: Nasal Cannula  Additional Equipment:   Intra-op Plan:   Post-operative Plan:   Informed Consent: I have reviewed the patients History and Physical, chart, labs and discussed the procedure including the risks, benefits and alternatives for the proposed anesthesia with the patient or authorized representative who has indicated his/her understanding and acceptance.   Dental advisory given  Plan Discussed with: CRNA  Anesthesia Plan Comments:         Anesthesia Quick Evaluation

## 2016-10-12 ENCOUNTER — Encounter (HOSPITAL_COMMUNITY): Payer: Self-pay | Admitting: Gastroenterology

## 2016-11-02 ENCOUNTER — Other Ambulatory Visit: Payer: Self-pay | Admitting: Family Medicine

## 2016-11-02 DIAGNOSIS — M79662 Pain in left lower leg: Secondary | ICD-10-CM

## 2016-11-03 DIAGNOSIS — G4733 Obstructive sleep apnea (adult) (pediatric): Secondary | ICD-10-CM | POA: Insufficient documentation

## 2016-11-05 ENCOUNTER — Other Ambulatory Visit: Payer: BLUE CROSS/BLUE SHIELD

## 2016-11-06 ENCOUNTER — Ambulatory Visit
Admission: RE | Admit: 2016-11-06 | Discharge: 2016-11-06 | Disposition: A | Discharge status: Home / Self Care | Payer: BLUE CROSS/BLUE SHIELD | Source: Ambulatory Visit | Attending: Family Medicine | Admitting: Family Medicine

## 2016-11-06 ENCOUNTER — Other Ambulatory Visit: Payer: BLUE CROSS/BLUE SHIELD

## 2016-11-06 DIAGNOSIS — M79662 Pain in left lower leg: Secondary | ICD-10-CM

## 2017-07-20 IMAGING — US US CAROTID DUPLEX BILAT
1 series · 13 of 24 positions shown · non-contrast
Comparison: None.

CLINICAL DATA: Carotid artery stenosis

EXAM:
BILATERAL CAROTID DUPLEX ULTRASOUND
TECHNIQUE: Gray scale imaging, color Doppler and duplex ultrasound were
performed of bilateral carotid and vertebral arteries in the neck.

[Series 1: us carotid duplex bilat · 0.06mm/px · 13 of 56 slices shown]
[im 1/56]
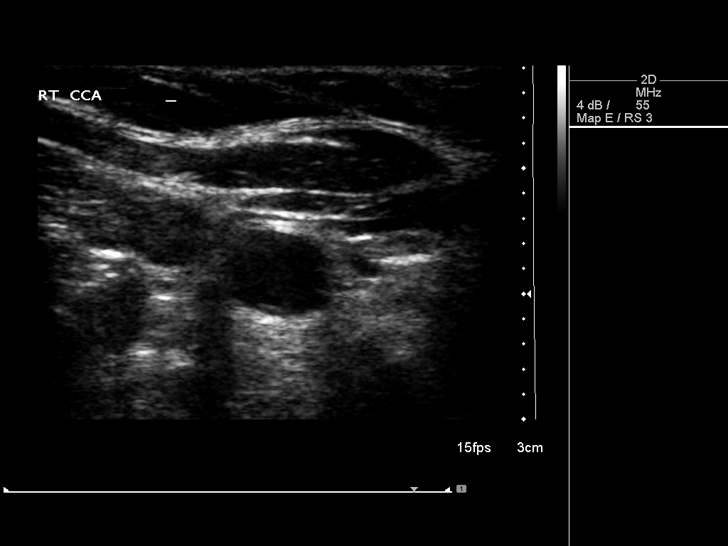
[im 5/56]
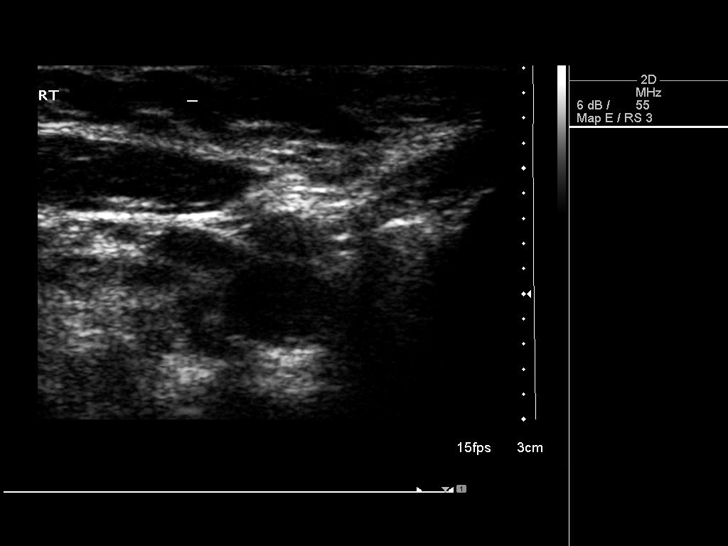
[im 10/56]
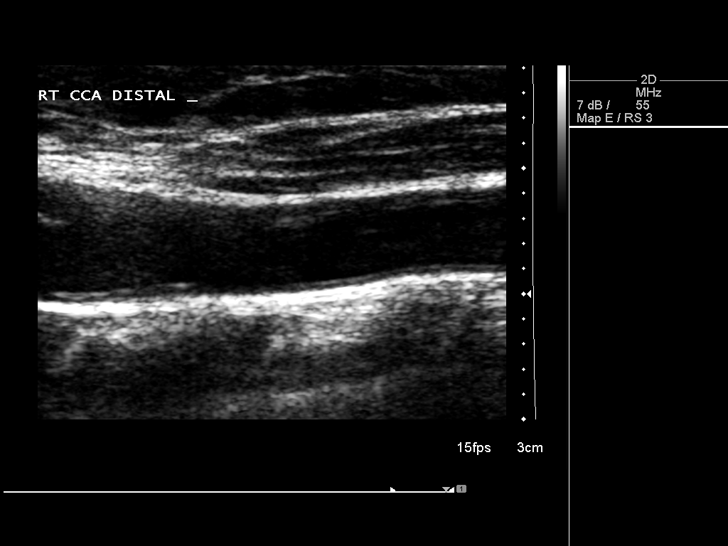
[im 15/56]
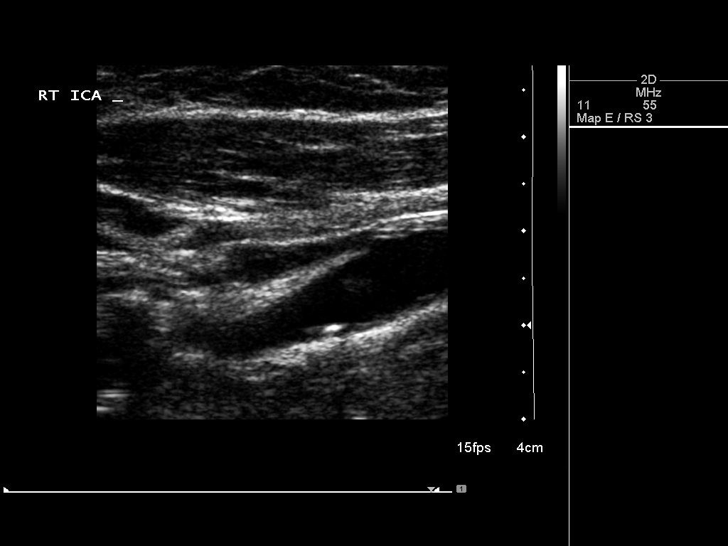
[im 20/56]
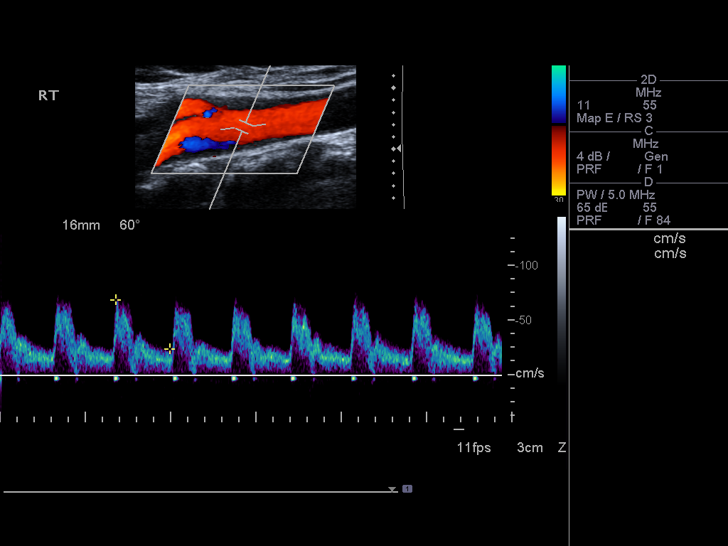
[im 24/56]
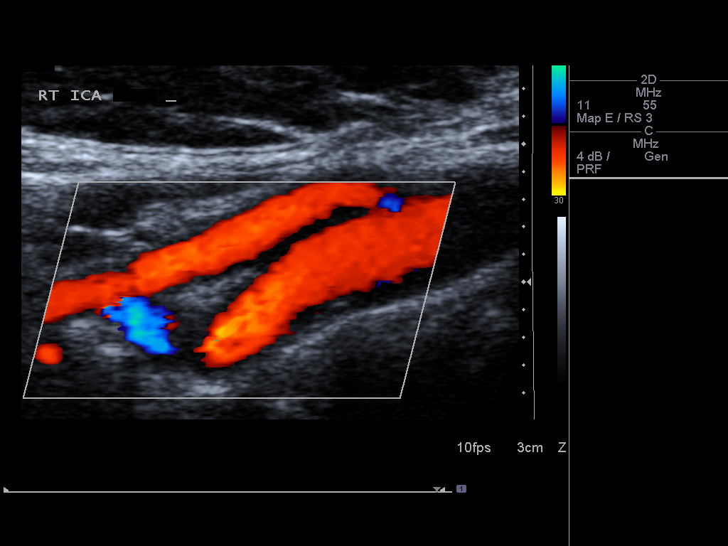
[im 29/56]
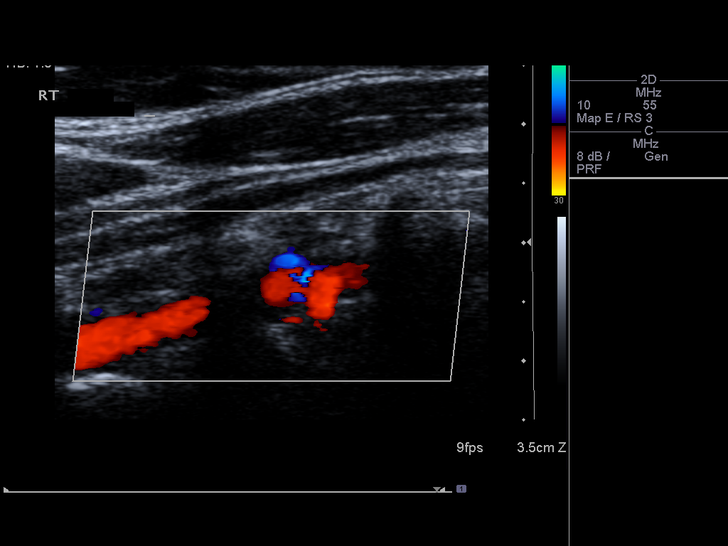
[im 32/56]
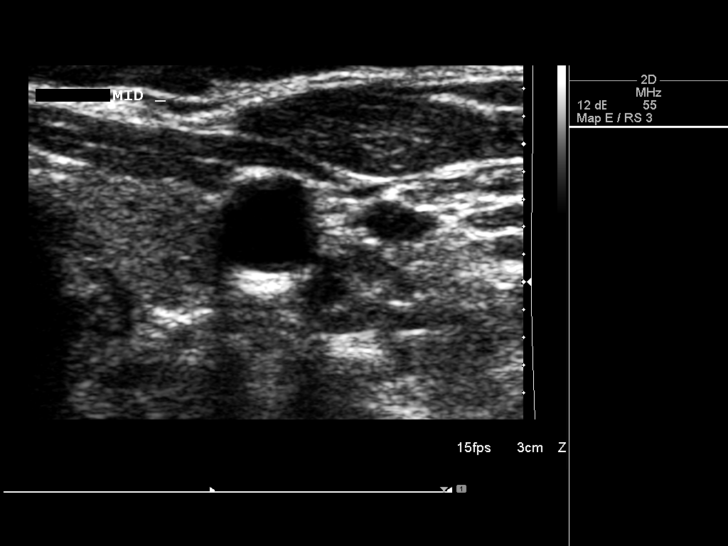
[im 36/56]
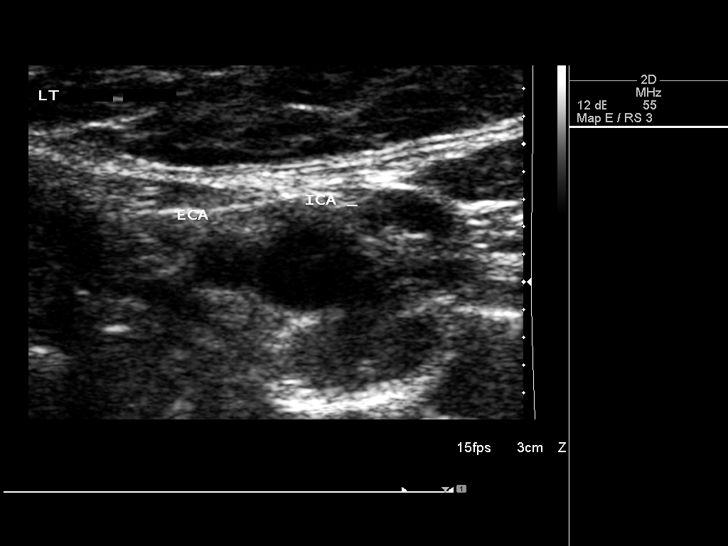
[im 41/56]
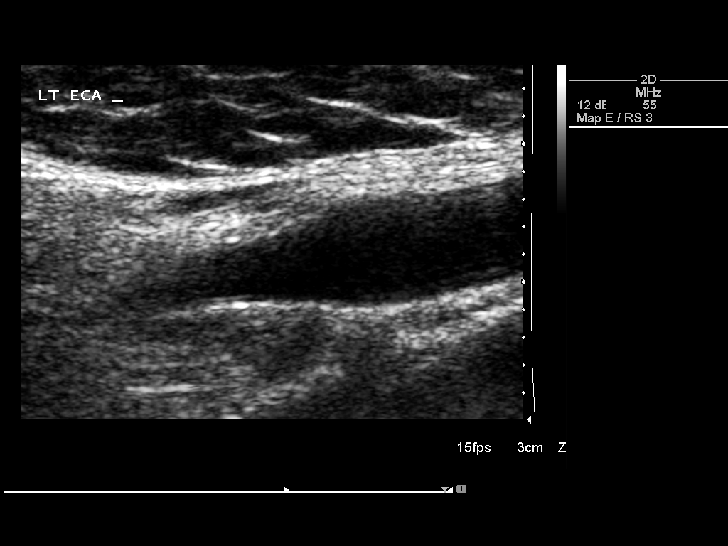
[im 46/56]
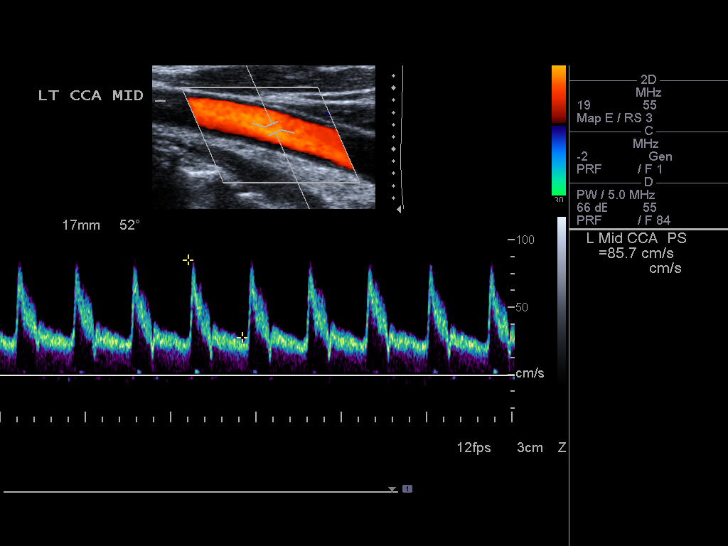
[im 51/56]
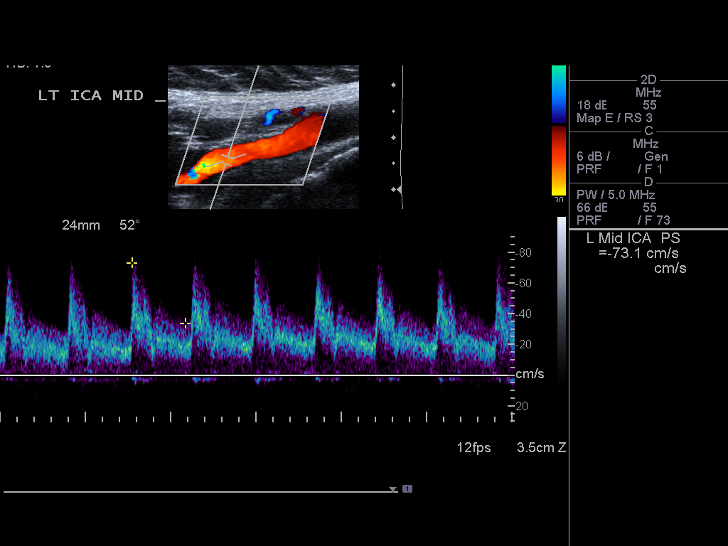
[im 56/56]
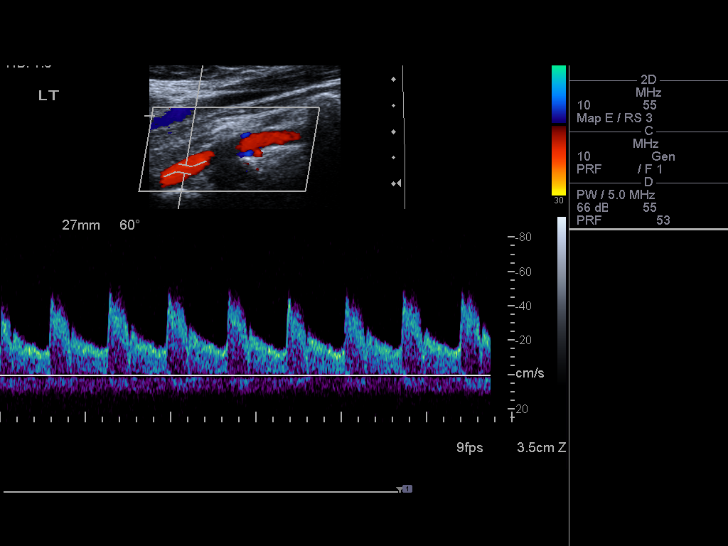

[13 of 24 positions shown; findings below may reference images not displayed]

FINDINGS: Criteria: Quantification of carotid stenosis is based on velocity
parameters that correlate the residual internal carotid diameter
with NASCET-based stenosis levels, using the diameter of the distal
internal carotid lumen as the denominator for stenosis measurement.

The following velocity measurements were obtained:

RIGHT

ICA:  158 cm/sec

CCA:  91 cm/sec

SYSTOLIC ICA/CCA RATIO:

DIASTOLIC ICA/CCA RATIO:

ECA:  181 cm/sec

LEFT

ICA:  89 cm/sec

CCA:  86 cm/sec

SYSTOLIC ICA/CCA RATIO:

DIASTOLIC ICA/CCA RATIO:

ECA:  79 cm/sec

RIGHT CAROTID ARTERY: Mild calcified plaque in the bulb. Low
resistance internal carotid Doppler pattern. The internal carotid
artery is moderately tortuous.

RIGHT VERTEBRAL ARTERY:  Antegrade.

LEFT CAROTID ARTERY: Little if any plaque in the bulb. Low
resistance internal carotid Doppler pattern.

LEFT VERTEBRAL ARTERY:  Antegrade.
IMPRESSION: Less than 50% stenosis in the right and left internal carotid
arteries. Mildly increased systolic velocity in the right internal
carotid artery likely reflects tortuosity.

## 2017-07-20 IMAGING — US US ABDOMINAL AORTA SCREENING AAA
1 series · 13 of 13 positions shown · non-contrast
Comparison: None.

CLINICAL DATA: Family history of aortic aneurysm, previous tobacco
abuse, hypertension

EXAM:
ULTRASOUND OF ABDOMINAL AORTA
TECHNIQUE: Ultrasound examination of the abdominal aorta was performed to
evaluate for abdominal aortic aneurysm.

[Series 1: us abdominal aorta screening aaa · 0.32mm/px · 13 of 13 slices shown]
[im 1/13]
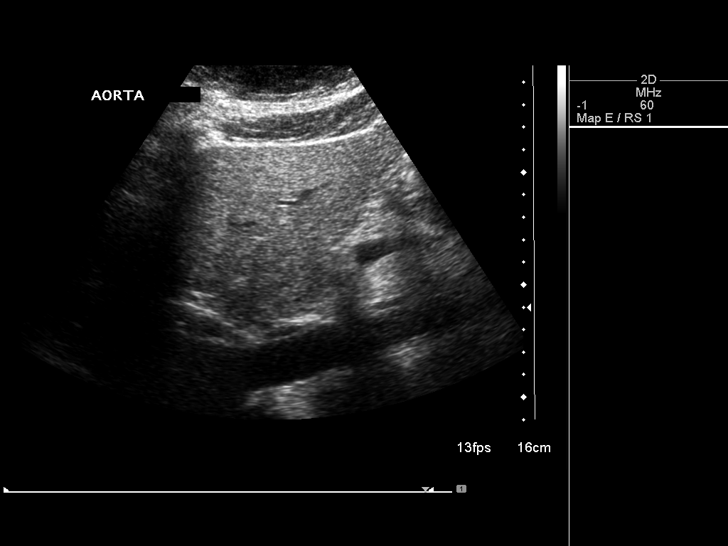
[im 2/13]
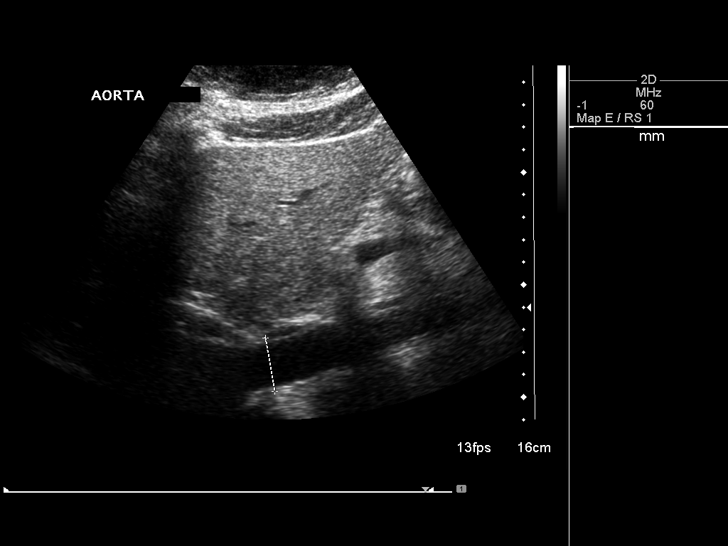
[im 3/13]
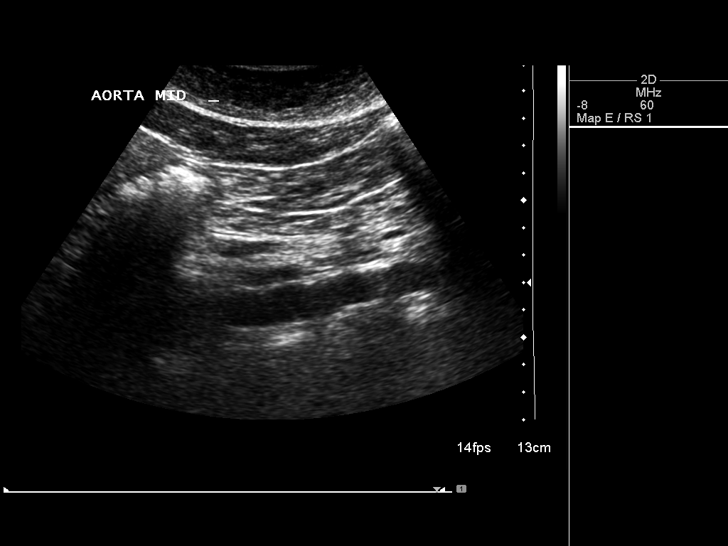
[im 4/13]
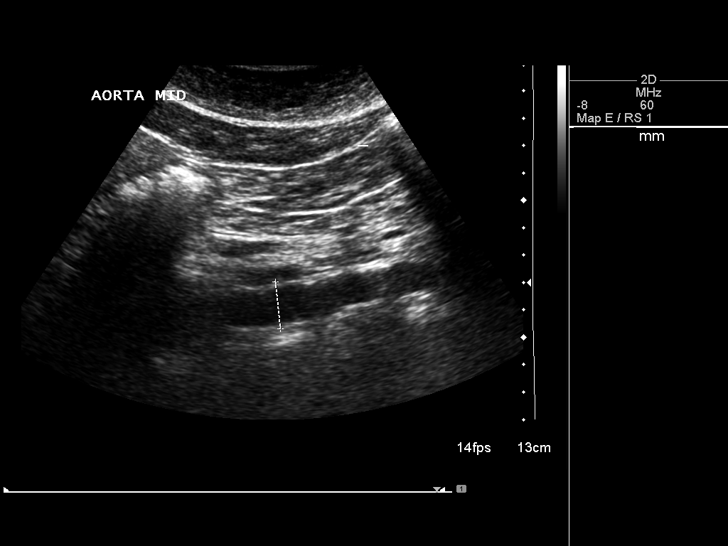
[im 5/13]
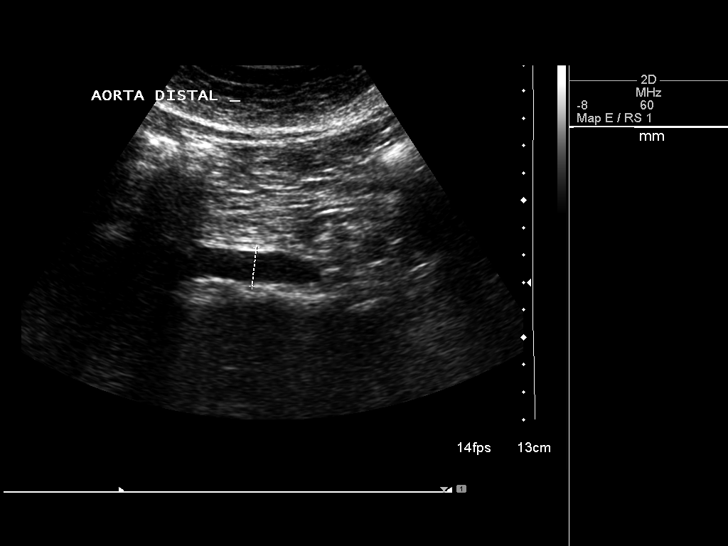
[im 6/13]
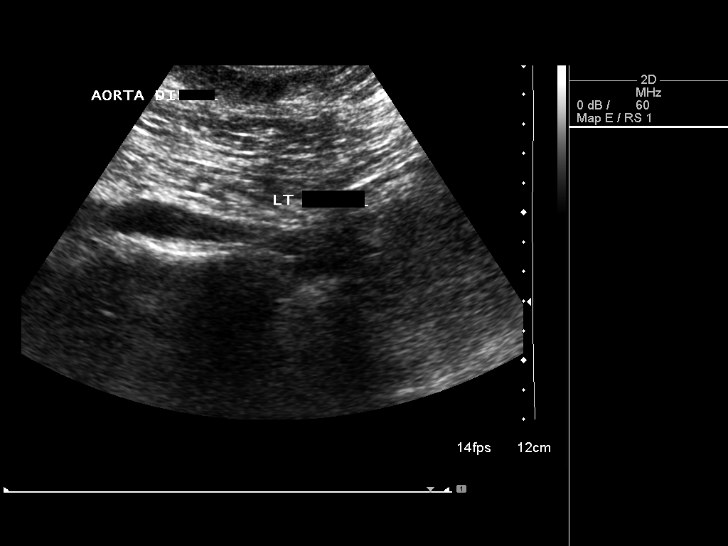
[im 7/13]
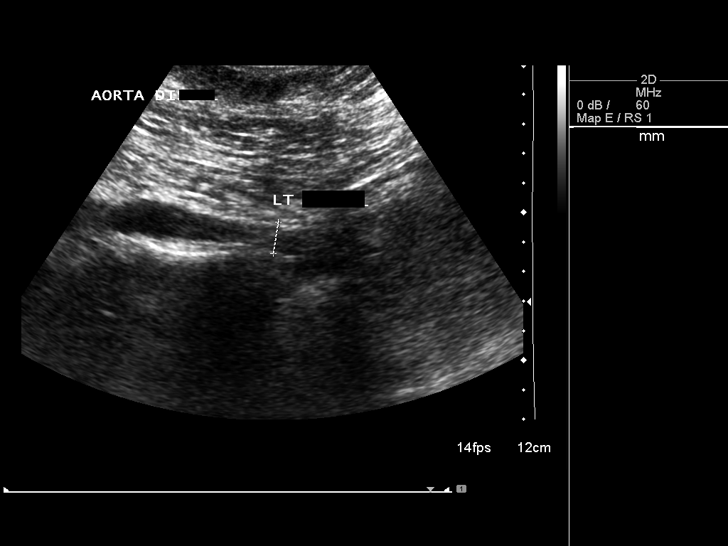
[im 8/13]
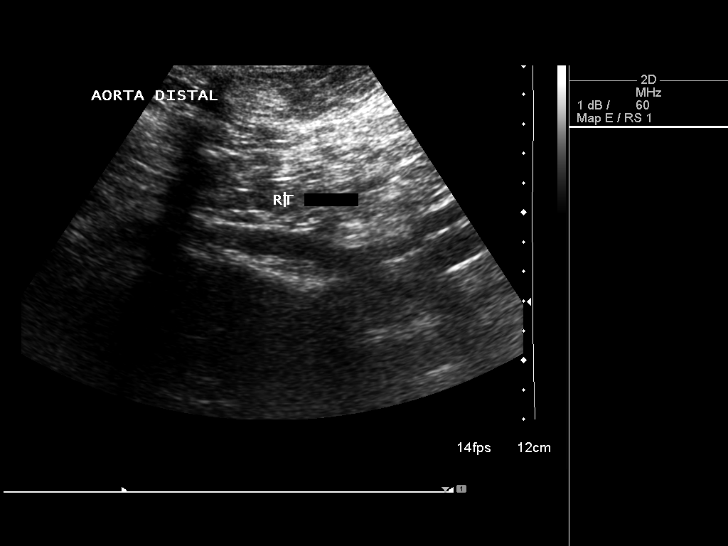
[im 9/13]
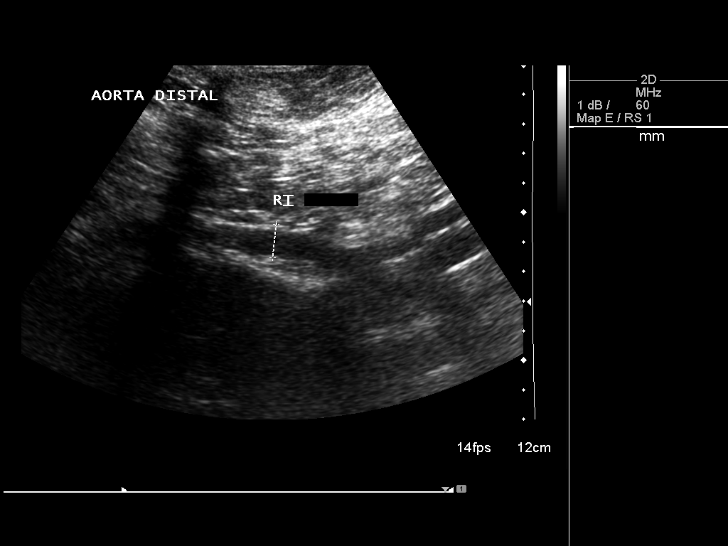
[im 10/13]
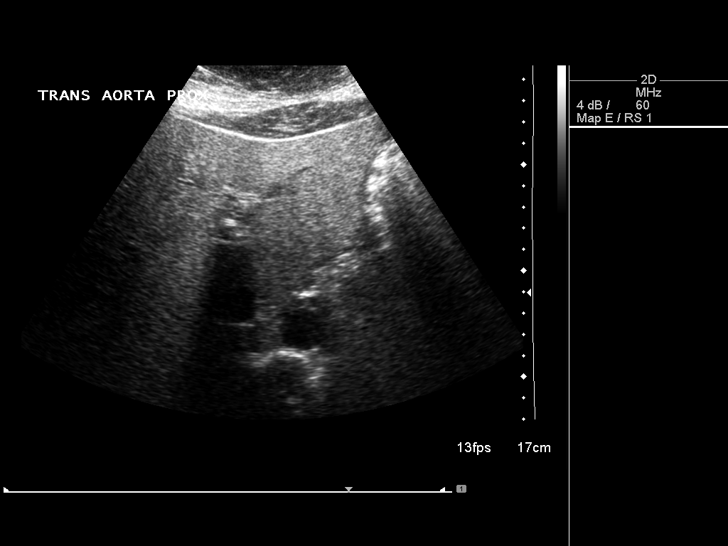
[im 11/13]
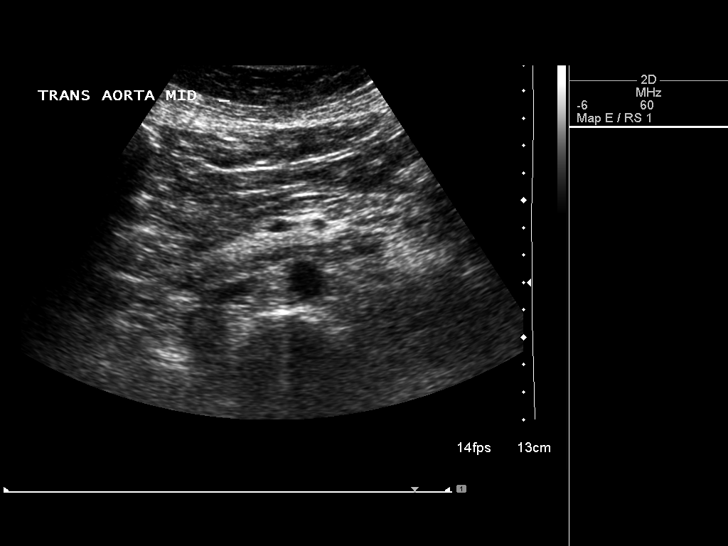
[im 12/13]
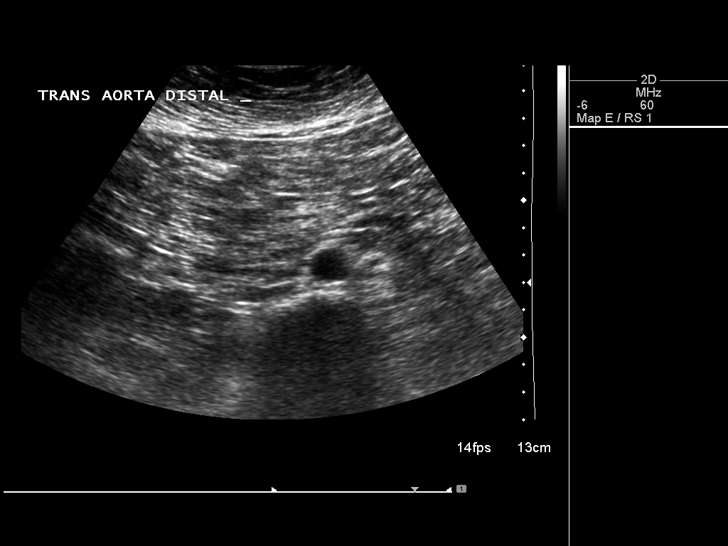
[im 13/13]
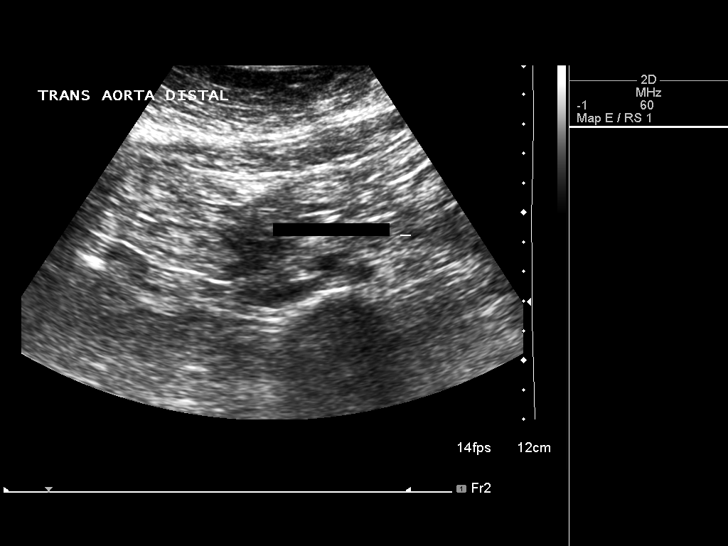

[13 of 13 positions shown; findings below may reference images not displayed]

FINDINGS: Abdominal Aorta

No aneurysm identified.

Maximum Diameter: 2.4 cm, proximal segment.

Common iliac arteries measure 11 mm diameter.
IMPRESSION: 1. Negative for abdominal aortic aneurysm.

## 2017-10-25 ENCOUNTER — Other Ambulatory Visit: Payer: Self-pay | Admitting: Family Medicine

## 2017-10-25 ENCOUNTER — Ambulatory Visit
Admission: RE | Admit: 2017-10-25 | Discharge: 2017-10-25 | Disposition: A | Discharge status: Home / Self Care | Payer: BLUE CROSS/BLUE SHIELD | Source: Ambulatory Visit | Attending: Family Medicine | Admitting: Family Medicine

## 2017-10-25 DIAGNOSIS — R0602 Shortness of breath: Secondary | ICD-10-CM

## 2017-10-25 DIAGNOSIS — J690 Pneumonitis due to inhalation of food and vomit: Secondary | ICD-10-CM

## 2019-07-12 ENCOUNTER — Other Ambulatory Visit: Payer: Self-pay | Admitting: Family Medicine

## 2019-07-24 ENCOUNTER — Other Ambulatory Visit: Payer: Self-pay | Admitting: Family Medicine

## 2019-07-24 DIAGNOSIS — E039 Hypothyroidism, unspecified: Secondary | ICD-10-CM

## 2019-07-24 DIAGNOSIS — R52 Pain, unspecified: Secondary | ICD-10-CM

## 2019-07-26 ENCOUNTER — Ambulatory Visit
Admission: RE | Admit: 2019-07-26 | Discharge: 2019-07-26 | Disposition: A | Discharge status: Home / Self Care | Payer: BC Managed Care – PPO | Source: Ambulatory Visit | Attending: Family Medicine | Admitting: Family Medicine

## 2019-07-26 DIAGNOSIS — R52 Pain, unspecified: Secondary | ICD-10-CM

## 2019-07-26 DIAGNOSIS — E039 Hypothyroidism, unspecified: Secondary | ICD-10-CM

## 2019-08-02 ENCOUNTER — Encounter: Payer: Self-pay | Admitting: Family Medicine

## 2020-03-19 ENCOUNTER — Institutional Professional Consult (permissible substitution): Payer: BLUE CROSS/BLUE SHIELD | Admitting: Neurology

## 2020-04-17 ENCOUNTER — Institutional Professional Consult (permissible substitution): Payer: BLUE CROSS/BLUE SHIELD | Admitting: Neurology

## 2020-07-05 ENCOUNTER — Other Ambulatory Visit: Payer: Self-pay

## 2020-07-05 ENCOUNTER — Other Ambulatory Visit: Payer: Self-pay | Admitting: Family Medicine

## 2020-07-05 ENCOUNTER — Ambulatory Visit
Admission: RE | Admit: 2020-07-05 | Discharge: 2020-07-05 | Disposition: A | Discharge status: Home / Self Care | Payer: BC Managed Care – PPO | Source: Ambulatory Visit | Attending: Family Medicine | Admitting: Family Medicine

## 2020-07-05 DIAGNOSIS — M542 Cervicalgia: Secondary | ICD-10-CM

## 2020-11-26 ENCOUNTER — Ambulatory Visit: Payer: BC Managed Care – PPO | Admitting: Cardiology

## 2020-12-03 ENCOUNTER — Institutional Professional Consult (permissible substitution): Payer: BLUE CROSS/BLUE SHIELD | Admitting: Neurology

## 2020-12-11 ENCOUNTER — Ambulatory Visit: Payer: BC Managed Care – PPO | Admitting: Cardiology

## 2020-12-12 ENCOUNTER — Institutional Professional Consult (permissible substitution): Payer: BLUE CROSS/BLUE SHIELD | Admitting: Neurology

## 2020-12-17 ENCOUNTER — Institutional Professional Consult (permissible substitution): Payer: BLUE CROSS/BLUE SHIELD | Admitting: Neurology

## 2020-12-19 ENCOUNTER — Institutional Professional Consult (permissible substitution): Payer: BLUE CROSS/BLUE SHIELD | Admitting: Neurology

## 2020-12-25 ENCOUNTER — Ambulatory Visit: Payer: BC Managed Care – PPO | Admitting: Cardiology

## 2021-01-06 ENCOUNTER — Encounter: Payer: Self-pay | Admitting: Internal Medicine

## 2021-01-06 ENCOUNTER — Other Ambulatory Visit: Payer: Self-pay

## 2021-01-06 ENCOUNTER — Ambulatory Visit (INDEPENDENT_AMBULATORY_CARE_PROVIDER_SITE_OTHER): Payer: BC Managed Care – PPO | Admitting: Internal Medicine

## 2021-01-06 ENCOUNTER — Ambulatory Visit (INDEPENDENT_AMBULATORY_CARE_PROVIDER_SITE_OTHER): Payer: BC Managed Care – PPO

## 2021-01-06 VITALS — BP 118/82 | HR 91 | Ht 61.0 in | Wt 167.4 lb

## 2021-01-06 DIAGNOSIS — Z01810 Encounter for preprocedural cardiovascular examination: Secondary | ICD-10-CM | POA: Diagnosis not present

## 2021-01-06 DIAGNOSIS — R002 Palpitations: Secondary | ICD-10-CM

## 2021-01-06 DIAGNOSIS — R0789 Other chest pain: Secondary | ICD-10-CM

## 2021-01-06 MED ORDER — METOPROLOL TARTRATE 100 MG PO TABS: ORAL_TABLET | ORAL | 0 refills | Status: DC

## 2021-01-06 NOTE — Progress Notes (Signed)
Cardiology Office Note   Date:  01/06/2021   ID:  Kayla Sanders, DOB [birth year], MRN [id number]  PCP:  Hayden Rasmussen, MD  Cardiologist:   Dorris Carnes, MD   Pt presents to clinic for evaluation of CP       History of Present Illness: Kayla Sanders is a 60 y.o. female with a history of CP   She was seen in 2013 by Ezzie Dural.   Burning / belching at time   ALso a hx of GERD   Stress echo done  Pt also with hx of palpitaitons.   Had had fro years     The pt says she has been under increased stress  Mother died 52 months ago   Son died suddenly on 03-04-2023   Etiol not clear     The pt says that she has had chest pressure, gassy sensation.   DIffent from previous pain several years ago   [place] activity will feel "squeezing"  The pt has also had more palpitations recnetly   Has had for years    Episodes now don't last long   Occur about 10 x per month    Pt had USN on carotids in Octobe 2017   Had mild plaquing in bulb   Current Meds  Medication Sig  . ALPRAZolam (XANAX) 0.5 MG tablet Take 1 tablet (0.5 mg total) by mouth 3 (three) times daily as needed for sleep or anxiety.  . bifidobacterium infantis (ALIGN) capsule Take 1 capsule by mouth daily. Generic from [employer] (member mark)  . dexlansoprazole (DEXILANT) 60 MG capsule Take 60 mg by mouth daily.  . famotidine (PEPCID) 20 MG tablet Take 40 mg by mouth at bedtime.  . fexofenadine (ALLEGRA) 180 MG tablet Take 180 mg by mouth every evening.  . fluconazole (DIFLUCAN) 100 MG tablet Take 100 mg by mouth daily as needed. For yeast at caesarean scar.  . hydrocortisone-pramoxine (ANALPRAM-HC) 2.5-1 % rectal cream Place 1 application rectally daily as needed. For hemorrhoids  . levothyroxine (SYNTHROID) 25 MCG tablet Take 25 mcg by mouth daily.  . montelukast (SINGULAIR) 10 MG tablet Take 1 tablet (10 mg total) by mouth at bedtime.  Marland Kitchen nystatin-triamcinolone (MYCOLOG II) cream Apply 1 application topically 2 (two) times daily as  needed. For cesarean incision (yeast at the site)  . olmesartan (BENICAR) 20 MG tablet Take 20 mg by mouth daily.  Marland Kitchen olopatadine (PATANOL) 0.1 % ophthalmic solution Place 1-2 drops into both eyes 2 (two) times daily as needed. For dry/irritated eyes  . sucralfate (CARAFATE) 1 GM/10ML suspension Take 10 mLs by mouth 4 (four) times daily as needed. For meals and at bedtime.  . valACYclovir (VALTREX) 500 MG tablet Take 500 mg by mouth as needed.  . Vitamin D, Ergocalciferol, (DRISDOL) 1.25 MG (50000 UNIT) CAPS capsule Take 50,000 Units by mouth once a week.     Allergies:   Pseudoephedrine hcl, Ephedrine, and Fentanyl   Past Medical History:  Diagnosis Date  . Anxiety   . Anxiety   . Asthma   . Complication of anesthesia    told by anesthesia had small airway  . Family history of adverse reaction to anesthesia    daughter has severe ponv  . Fibromyalgia   . GERD (gastroesophageal reflux disease)   . Hyperlipidemia   . Hypertension   . Insomnia   . Migraines   . Mitral valve prolapse   . PONV (postoperative nausea and vomiting)    with  general anesthesia severe ponv, does ok with propofol  . Vitamin D deficiency     Past Surgical History:  Procedure Laterality Date  . ABDOMINAL HYSTERECTOMY     partial  . BRAVO [place] STUDY N/A 02/06/2013   Procedure: BRAVO [place];  Surgeon: Juanita Craver, MD;  Location: [facility];  Service: Endoscopy;  Laterality: N/A;  . CARPAL TUNNEL RELEASE Right   . CHOLECYSTECTOMY    . COLONOSCOPY WITH PROPOFOL N/A 10/09/2016   Procedure: COLONOSCOPY WITH PROPOFOL;  Surgeon: Carol Ada, MD;  Location: [facility];  Service: Endoscopy;  Laterality: N/A;  . colonscopy  2012  . ESOPHAGOGASTRODUODENOSCOPY N/A 02/06/2013   Procedure: ESOPHAGOGASTRODUODENOSCOPY (EGD);  Surgeon: Juanita Craver, MD;  Location: [facility];  Service: Endoscopy;  Laterality: N/A;  . ESOPHAGOGASTRODUODENOSCOPY (EGD) WITH PROPOFOL N/A 10/09/2016   Procedure:  ESOPHAGOGASTRODUODENOSCOPY (EGD) WITH PROPOFOL;  Surgeon: Carol Ada, MD;  Location: [facility];  Service: Endoscopy;  Laterality: N/A;  . HEMORRHOIDECTOMY WITH HEMORRHOID BANDING    . NASAL SEPTUM SURGERY    . RECTOCELE REPAIR    . TMJ ARTHROPLASTY     and removal  . TUBAL LIGATION    . VESICOVAGINAL FISTULA CLOSURE W/ TAH       Social History:  The patient  reports that she quit smoking about 39 years ago. She has never used smokeless tobacco. She reports current alcohol use. She reports that she does not use drugs.   Family History:  The patient's family history includes Arthritis in her father, mother, and another family member; Breast cancer in an other family member; Diabetes in an other family member; Heart disease in her father; Hyperlipidemia in her father and mother; Hypertension in her father and mother; Sleep apnea in her brother, father, and mother; Stroke in her father.    ROS:  Please see the history of present illness. All other systems are reviewed and  Negative to the above problem except as noted.    PHYSICAL EXAM: VS:  BP 118/82   Pulse 91   Ht 5\' 1"  (1.549 m)   Wt 167 lb 6.4 oz (75.9 kg)   SpO2 97%   BMI 31.63 kg/m   GEN: Well nourished, well developed, in no acute distress  HEENT: normal  Neck: no JVD, carotid bruits,  Cardiac: RRR; no murmurs, No LE edema  Respiratory:  clear to auscultation bilaterally, GI: soft, nontender, nondistended, + BS  No hepatomegaly  MS: no deformity Moving all extremities   Skin: warm and dry, no rash Neuro:  Strength and sensation are intact Psych: euthymic mood, full affect   EKG:  EKG is ordered today.SR 91 bpm   Lipid Panel    Component Value Date/Time   CHOL 182 09/05/2013 1107   TRIG 100.0 09/05/2013 1107   HDL 54.30 09/05/2013 1107   CHOLHDL 3 09/05/2013 1107   VLDL 20.0 09/05/2013 1107   LDLCALC 108 (H) 09/05/2013 1107      Wt Readings from Last 3 Encounters:  01/06/21 167 lb 6.4 oz (75.9 kg)   10/09/16 170 lb (77.1 kg)  09/10/16 170 lb (77.1 kg)      ASSESSMENT AND PLAN:  1  Chest pain   Pt with CP   Not completely typical for angina but conderning  Will set up for coronary CT angiogram     2  Palpitations   SHort lived   Do not appear to be hemodynamialyy destabilizing   Will set up for 2 wk montor  3 HTN  BP is controlled   Current medicines are reviewed at length with the patient today.  The patient does not have concerns regarding medicines.  Signed, Dorris Carnes, MD  01/06/2021 2:23 PM    [place], [place], [place]  [address] Phone: [phone]; Fax: [phone]

## 2021-01-06 NOTE — Patient Instructions (Addendum)
Medication Instructions:  Your physician recommends that you continue on your current medications as directed. Please refer to the Current Medication list given to you today.  *If you need a refill on your cardiac medications before your next appointment, please call your pharmacy*   Lab Work: bmet If you have labs (blood work) drawn today and your tests are completely normal, you will receive your results only by: Marland Kitchen MyChart Message (if you have MyChart) OR . A paper copy in the mail If you have any lab test that is abnormal or we need to change your treatment, we will call you to review the results.   Testing/Procedures: Your cardiac CT will be scheduled at one of the below locations:   [facility] [address] [place], [place] [address] [phone]  [place] [address] [place], [place] [address] [phone]  If scheduled at [facility], please arrive at the [facility] main entrance (entrance A) of [facility] 30 minutes prior to test start time. Proceed to the [facility] Radiology Department (first floor) to check-in and test prep.  If scheduled at [facility], please arrive 15 mins early for check-in and test prep.  Please follow these instructions carefully (unless otherwise directed):  On the Night Before the Test: . Be sure to Drink plenty of water. . Do not consume any caffeinated/decaffeinated beverages or chocolate 12 hours prior to your test. . Do not take any antihistamines 12 hours prior to your test. . If the patient has contrast allergy: ? Patient will need a prescription for Prednisone and very clear instructions (as follows): 1. Prednisone 50 mg - take 13 hours prior to test 2. Take another Prednisone 50 mg 7 hours prior to test 3. Take another Prednisone 50 mg 1 hour prior to test 4. Take Benadryl 50 mg 1 hour prior to test . Patient must  complete all four doses of above prophylactic medications. . Patient will need a ride after test due to Benadryl.  On the Day of the Test: . Drink plenty of water until 1 hour prior to the test. . Do not eat any food 4 hours prior to the test. . You may take your regular medications prior to the test.  . Take metoprolol (Lopressor) two hours prior to test. . HOLD Furosemide/Hydrochlorothiazide morning of the test. . FEMALES- please wear underwire-free bra if available         -Drink plenty of water       -Hold Furosemide/hydrochlorothiazide morning of the test       -Take metoprolol (Lopressor) 2 hours prior to test (if applicable).                       After the Test: . Drink plenty of water. . After receiving IV contrast, you may experience a mild flushed feeling. This is normal. . On occasion, you may experience a mild rash up to 24 hours after the test. This is not dangerous. If this occurs, you can take Benadryl 25 mg and increase your fluid intake. . If you experience trouble breathing, this can be serious. If it is severe call 911 IMMEDIATELY. If it is mild, please call our office. . If you take any of these medications: Glipizide/Metformin, Avandament, Glucavance, please do not take 48 hours after completing test unless otherwise instructed.   Once we have confirmed authorization from [employer], we  will call you to set up a date and time for your test. Based on how quickly your insurance processes prior authorizations requests, please allow up to 4 weeks to be contacted for scheduling your Cardiac CT appointment. Be advised that routine Cardiac CT appointments could be scheduled as many as 8 weeks after your provider has ordered it.  For non-scheduling related questions, please contact the cardiac imaging nurse navigator should you have any questions/concerns: Marchia Bond, Cardiac Imaging Nurse Navigator Gordy Clement, Cardiac Imaging Nurse Navigator [place]  [facility] Direct Office Dial: [phone]   For scheduling needs, including cancellations and rescheduling, please call [place], [phone].     Follow-Up: At [facility], you and your health needs are our priority.  As part of our continuing mission to provide you with exceptional heart care, we have created designated Provider Care Teams.  These Care Teams include your primary Cardiologist (physician) and Advanced Practice Providers (APPs -  Physician Assistants and Nurse Practitioners) who all work together to provide you with the care you need, when you need it.  We recommend signing up for the patient portal called "MyChart".  Sign up information is provided on this After Visit Summary.  MyChart is used to connect with patients for Virtual Visits (Telemedicine).  Patients are able to view lab/test results, encounter notes, upcoming appointments, etc.  Non-urgent messages can be sent to your provider as well.   To learn more about what you can do with MyChart, go to [email or web address].

## 2021-01-07 LAB — BASIC METABOLIC PANEL
BUN/Creatinine Ratio: 15 (ref 9–23)
BUN: 12 mg/dL (ref 6–24)
CO2: 22 mmol/L (ref 20–29)
Calcium: 9.8 mg/dL (ref 8.7–10.2)
Chloride: 105 mmol/L (ref 96–106)
Creatinine, Ser: 0.79 mg/dL (ref 0.57–1.00)
GFR calc Af Amer: 95 mL/min/{1.73_m2} (ref >59)
GFR calc non Af Amer: 82 mL/min/{1.73_m2} (ref >59)
Glucose: 100 mg/dL — ABNORMAL HIGH (ref 65–99)
Potassium: 4.1 mmol/L (ref 3.5–5.2)
Sodium: 143 mmol/L (ref 134–144)

## 2021-01-08 ENCOUNTER — Institutional Professional Consult (permissible substitution): Payer: BLUE CROSS/BLUE SHIELD | Admitting: Neurology

## 2021-01-13 ENCOUNTER — Encounter: Payer: Self-pay | Admitting: Neurology

## 2021-01-13 ENCOUNTER — Ambulatory Visit (INDEPENDENT_AMBULATORY_CARE_PROVIDER_SITE_OTHER): Payer: BC Managed Care – PPO | Admitting: Neurology

## 2021-01-13 VITALS — BP 132/88 | HR 91 | Ht 61.0 in | Wt 166.0 lb

## 2021-01-13 DIAGNOSIS — R519 Headache, unspecified: Secondary | ICD-10-CM | POA: Diagnosis not present

## 2021-01-13 DIAGNOSIS — G479 Sleep disorder, unspecified: Secondary | ICD-10-CM

## 2021-01-13 DIAGNOSIS — Z789 Other specified health status: Secondary | ICD-10-CM

## 2021-01-13 DIAGNOSIS — G4719 Other hypersomnia: Secondary | ICD-10-CM | POA: Diagnosis not present

## 2021-01-13 DIAGNOSIS — G4733 Obstructive sleep apnea (adult) (pediatric): Secondary | ICD-10-CM | POA: Diagnosis not present

## 2021-01-13 DIAGNOSIS — Z9989 Dependence on other enabling machines and devices: Secondary | ICD-10-CM

## 2021-01-13 NOTE — Patient Instructions (Signed)
I am sorry for your loss.  We will try to get you a mask if we have a sample in our sleep lab. I will send an order for your CPAP supplies to [place]. As discussed, your sleep apnea is not fully controlled.  I would like for you to return for a split-night sleep study during which we will try to optimize your treatment settings and may be consider a different type of machine called BiPAP. Please use your current machine for now.  We will call you to schedule your sleep study once we have the insurance authorization.  Please hang in there!

## 2021-01-13 NOTE — Progress Notes (Signed)
Subjective:    Patient ID: ARRIA NAIM is a 60 y.o. female.  HPI     [name], MD, PhD [facility] [address] [place], [place] [address]  Dear Dr. Darron Doom,    I saw your patient, Terianne Thaker, upon your kind request in my neurologic clinic today for re-consultation of her obstructive sleep apnea. The patient is unaccompanied today. As you know, Ms. Chavers is a 60 year old right-handed woman with an underlying medical history of vitamin D deficiency, hypertension, hyperlipidemia, reflux disease, mitral valve prolapse, migraine headaches, anxiety, asthma, and mild obesity, who was previously diagnosed with obstructive sleep apnea.  I had evaluated her in 2016 for her sleep disorder.  She was advised to proceed with a sleep study.  She did not have a sleep test through our office at the time. She had a subsequent split-night sleep study through [facility] long hospital on 08/1916 and the study was interpreted by Dr. Baird Lyons.  AHI at baseline was 17.1/h, O2 nadir 88%.  She was titrated on CPAP with an optimal pressure of 12 cm.  She had a consultation with ENT, Dr. Wilburn Cornelia in December 2017 and surgical intervention was not recommended at the time.   She has been using the [place] view full facemask from [employer].  She does have to tighten it quite a bit.  Last year she had to buy supplies online.  She has changed DME companies over time, first she was with [facility], then [facility], now with [facility] but has not received any supplies from [facility] yet.  She has had significant stressors lately. Sadly, she lost her son about a week ago.  Her daughters who live in [place] are currently staying with her.  Patient is having a difficult time.  She also lost her mom last year in June.  She was mom's caretaker, she had end-stage lung cancer which was diagnosed in February 2021, Mom lived in [place] and patient stayed with her between  February and June 2021 until her passing.  She has trouble sleeping currently which is understandable due to her stress.  She does not have a very set schedule right now.  She is trying to use her machine consistently and I was able to review her compliance data from 12/15/2020 through 01/12/2021 which is a total of 29 days, during which time she used her machine every night with an average usage of 6 hours and 20 minutes, residual AHI elevated at 12.8, breakdown of the events is not available, not sure if her residual events are mostly or exclusively obstructive versus central's.  CPAP pressure of 12 cm with EPR of 2.  She has a mild leak, 95th percentile is 2.4 L/min.  She reports no night to night nocturia but has woken up with a headache at times.  Epworth sleepiness score is 12 out of 24, fatigue severity score is 15 out of 63.  Previously:   11/11/15: 60 year old right-handed woman with an underlying medical history of asthma, vitamin D deficiency, seasonal allergies, hyperlipidemia, hypertension, mitral valve prolapse, reflux disease, migraines, anxiety, obesity and fibromyalgia, who reports snoring and excessive daytime somnolence.  I reviewed your office note from 10/08/2015, which you kindly included. Of note, she had a previous sleep study at [facility] on 12/30/2001 which showed very mild obstructive sleep apnea and light snoring with normal oxygenation according to the report. RDI was 8 per hour. She had no PLMS. I reviewed the report.  Her weight was 148 lb at the time (today at 169 lb).   She reports an approximately 20 pound weight gain this year. She has had a lot of stress. Her daughter has been diagnosed with autonomic dysfunction and her father has advanced dementia. Her daughter lives in [place] and her parents live in [place]. She has been traveling back and forth to [place] in [place] a lot this year. Both parents have sleep apnea and uses CPAP machine, her mother has recently  changed to oxygen only as she needs to hear her father at night. The patient reports morning headaches occasionally. She has no significant nocturia. She tries to keep a bedtime of 11 PM but has trouble going to sleep and staying asleep. She has been taking Xanax or clonazepam at night for sleep. She has previously tried Ambien, Lunesta, and Rozerem for sleep with limited or no success. 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 She quit smoking in 1983, she does not drink alcohol regularly and drinks about 3 cups of coffee per day, latest at lunch. She does not typically drink sodas or tea. She lives at home with her husband. She has 1 son and 2 daughters, all grown. She denies restless leg symptoms or leg twitching at night. Her Epworth sleepiness score is 12 out of 24 today, her fatigue score is 45 out of 63 today.  Her Past Medical History Is Significant For: Past Medical History:  Diagnosis Date  . Anxiety   . Anxiety   . Asthma   . Complication of anesthesia    told by anesthesia had small airway  . Family history of adverse reaction to anesthesia    daughter has severe ponv  . Fibromyalgia   . GERD (gastroesophageal reflux disease)   . Hyperlipidemia   . Hypertension   . Insomnia   . Migraines   . Mitral valve prolapse   . PONV (postoperative nausea and vomiting)    with general anesthesia severe ponv, does ok with propofol  . Vitamin D deficiency     Her Past Surgical History Is Significant For: Past Surgical History:  Procedure Laterality Date  . ABDOMINAL HYSTERECTOMY     partial  . BRAVO [place] STUDY N/A 02/06/2013   Procedure: BRAVO [place];  Surgeon: Juanita Craver, MD;  Location: [facility];  Service: Endoscopy;  Laterality: N/A;  . CARPAL TUNNEL RELEASE Right   . CHOLECYSTECTOMY    . COLONOSCOPY WITH PROPOFOL N/A 10/09/2016   Procedure: COLONOSCOPY WITH PROPOFOL;  Surgeon: Carol Ada, MD;  Location: [facility];  Service: Endoscopy;  Laterality:  N/A;  . colonscopy  2012  . ESOPHAGOGASTRODUODENOSCOPY N/A 02/06/2013   Procedure: ESOPHAGOGASTRODUODENOSCOPY (EGD);  Surgeon: Juanita Craver, MD;  Location: [facility];  Service: Endoscopy;  Laterality: N/A;  . ESOPHAGOGASTRODUODENOSCOPY (EGD) WITH PROPOFOL N/A 10/09/2016   Procedure: ESOPHAGOGASTRODUODENOSCOPY (EGD) WITH PROPOFOL;  Surgeon: Carol Ada, MD;  Location: [facility];  Service: Endoscopy;  Laterality: N/A;  . HEMORRHOIDECTOMY WITH HEMORRHOID BANDING    . NASAL SEPTUM SURGERY    . RECTOCELE REPAIR    . TMJ ARTHROPLASTY     and removal  . TUBAL LIGATION    . VESICOVAGINAL FISTULA CLOSURE W/ TAH      Her Family History Is Significant For: Family History  Problem Relation Age of Onset  . Arthritis Mother   . Hyperlipidemia Mother   . Hypertension Mother   . Sleep apnea Mother   . Arthritis Father   .  Hyperlipidemia Father   . Heart disease Father   . Stroke Father   . Hypertension Father   . Sleep apnea Father   . Arthritis Other   . Breast cancer Other   . Diabetes Other   . Sleep apnea Brother     Her Social History Is Significant For: Social History   Socioeconomic History  . Marital status: Married    Spouse name: Not on file  . Number of children: 3  . Years of education: BS  . Highest education level: Not on file  Occupational History  . Occupation: [employer]  Tobacco Use  . Smoking status: Former Smoker    Quit date: 11/23/1981    Years since quitting: 39.1  . Smokeless tobacco: Never Used  Substance and Sexual Activity  . Alcohol use: Yes    Alcohol/week: 0.0 standard drinks    Comment: Rare  . Drug use: No  . Sexual activity: Not on file  Other Topics Concern  . Not on file  Social History Narrative   Married with grown children. Works for [employer] as a Teacher, early years/pre. Nonsmoker, quit in 1983. Rare social Etoh.   Consumes about 3+ caffeine drinks a day    Social Determinants of Health   Financial Resource Strain: Not on file  Food  Insecurity: Not on file  Transportation Needs: Not on file  Physical Activity: Not on file  Stress: Not on file  Social Connections: Not on file    Her Allergies Are:  Allergies  Allergen Reactions  . Pseudoephedrine Hcl Palpitations    Tachycardia  . Ephedrine     palpitations   . Fentanyl     Does not work well, itching pt prefers not to have  :   Her Current Medications Are:  Outpatient Encounter Medications as of 01/13/2021  Medication Sig  . ALPRAZolam (XANAX) 0.5 MG tablet Take 1 tablet (0.5 mg total) by mouth 3 (three) times daily as needed for sleep or anxiety.  . bifidobacterium infantis (ALIGN) capsule Take 1 capsule by mouth daily. Generic from [facility] (member mark)  . dexlansoprazole (DEXILANT) 60 MG capsule Take 60 mg by mouth daily.  . famotidine (PEPCID) 20 MG tablet Take 40 mg by mouth at bedtime.  . fexofenadine (ALLEGRA) 180 MG tablet Take 180 mg by mouth every evening.  . fluconazole (DIFLUCAN) 100 MG tablet Take 100 mg by mouth daily as needed. For yeast at caesarean scar.  . hydrocortisone-pramoxine (ANALPRAM-HC) 2.5-1 % rectal cream Place 1 application rectally daily as needed. For hemorrhoids  . levothyroxine (SYNTHROID) 25 MCG tablet Take 25 mcg by mouth daily.  . metoprolol tartrate (LOPRESSOR) 100 MG tablet Take one tablet (100 mg) 2 hours prior to your Cardiac CT. Hold if your heart rate is below 60 beats per minute.  . montelukast (SINGULAIR) 10 MG tablet Take 1 tablet (10 mg total) by mouth at bedtime.  Marland Kitchen nystatin-triamcinolone (MYCOLOG II) cream Apply 1 application topically 2 (two) times daily as needed. For cesarean incision (yeast at the site)  . olmesartan (BENICAR) 20 MG tablet Take 20 mg by mouth daily.  Marland Kitchen olopatadine (PATANOL) 0.1 % ophthalmic solution Place 1-2 drops into both eyes 2 (two) times daily as needed. For dry/irritated eyes  . sucralfate (CARAFATE) 1 GM/10ML suspension Take 10 mLs by mouth 4 (four) times daily as needed. For meals and  at bedtime.  . valACYclovir (VALTREX) 500 MG tablet Take 500 mg by mouth as needed.  . Vitamin D, Ergocalciferol, (DRISDOL)  1.25 MG (50000 UNIT) CAPS capsule Take 50,000 Units by mouth once a week.   No facility-administered encounter medications on file as of 01/13/2021.  :  Review of Systems:  Out of a complete 14 point review of systems, all are reviewed and negative with the exception of these symptoms as listed below: Review of Systems  Neurological:       Here for sleep consult. Pt has been on CPAP for a few years now. Is here to re establish care and DME company. She has used [facility], [facility] and [facility] in the past. She also sts her son and mother recently passed away and she has not been as consistent with her machine.  Epworth Sleepiness Scale 0= would never doze 1= slight chance of dozing 2= moderate chance of dozing 3= high chance of dozing  Sitting and reading:2 Watching TV:1 Sitting inactive in a public place (ex. Theater or meeting):2 As a passenger in a car for an hour without a break:3 Lying down to rest in the afternoon:1 Sitting and talking to someone:1 Sitting quietly after lunch (no alcohol):2 In a car, while stopped in traffic:0 Total:12     Objective:  Neurological Exam  Physical Exam Physical Examination:   Vitals:   01/13/21 1510  BP: 132/88  Pulse: 91  SpO2: 97%    General Examination: The patient is a very pleasant 60 y.o. female in no acute distress. She appears well-developed and well-nourished and well groomed.   HEENT: Normocephalic, atraumatic, pupils are equal, round and reactive to light, extraocular tracking is good without limitation to gaze excursion or nystagmus noted. Hearing is grossly intact. Face is symmetric with normal facial animation. Speech is clear with no dysarthria noted. There is no hypophonia. There is no lip, neck/head, jaw or voice tremor. Neck is supple with full range of passive and active motion. There are no carotid  bruits on auscultation. Oropharynx exam reveals: mild mouth dryness, adequate dental hygiene and marked airway crowding, due to Moderate airway entry, Mallampati class IV, redundant soft palate.  Tongue protrudes centrally and palate elevates symmetrically.  Tonsils not fully visualized.  She has some overbite.  Chest: Clear to auscultation without wheezing, rhonchi or crackles noted.  Heart: S1+S2+0, regular and normal without murmurs, rubs or gallops noted.   Abdomen: Soft, non-tender and non-distended with normal bowel sounds appreciated on auscultation.  Extremities: There is no pitting edema in the distal lower extremities bilaterally.   Skin: Warm and dry without trophic changes noted.   Musculoskeletal: exam reveals no obvious joint deformities, tenderness or joint swelling or erythema.   Neurologically:  Mental status: The patient is awake, alert and oriented in all 4 spheres. Her immediate and remote memory, attention, language skills and fund of knowledge are appropriate. There is no evidence of aphasia, agnosia, apraxia or anomia. Speech is clear with normal prosody and enunciation. Thought process is linear. Mood is sad and affect is blunted.  Cranial nerves II - XII are as described above under HEENT exam.  Motor exam: Normal bulk, strength and tone is noted. There is no tremor, fine motor skills and coordination: grossly intact.  Cerebellar testing: No dysmetria or intention tremor. There is no truncal or gait ataxia.  Sensory exam: intact to light touch in the upper and lower extremities.  Gait, station and balance: No problems standing or walking.    Assessment and Plan:   In summary, LOUIS GAW is a very pleasant 60 y.o.-year old female with an underlying medical  history of vitamin D deficiency, hypertension, hyperlipidemia, reflux disease, mitral valve prolapse, migraine headaches, anxiety, asthma, and mild obesity, who presents for evaluation of her obstructive  sleep apnea.  She was diagnosed with moderate obstructive sleep apnea in 2017.  Sleep study testing was at [facility] sleep lab.  She is compliant with her current CPAP of 12 cm via full facemask but is struggling with the pressure.  She has had trouble getting supplies.  She has residual daytime somnolence but is also going through significant stressors right now.  She does not have a very set sleep schedule.  She is agreeable to returning for a split-night sleep study during which we will consider BiPAP therapy if need be.  She may benefit from changing the style of mask as well.  We will try to provide her with a mask before she leaves and I will send orders for supplies to [facility].  She is advised to continue with her current machine.  We will request a authorization through her insurance for a split-night sleep study and schedule her according to her availability.   I plan to see her back after testing.  I answered all her questions today and she was in agreement.   Thank you very much for allowing me to participate in the care of this nice patient. If I can be of any further assistance to you please do not hesitate to call me at [phone].  Sincerely,   [name], MD, PhD

## 2021-01-21 ENCOUNTER — Telehealth (HOSPITAL_COMMUNITY): Payer: Self-pay | Admitting: *Deleted

## 2021-01-21 DIAGNOSIS — Z1589 Genetic susceptibility to other disease: Secondary | ICD-10-CM | POA: Insufficient documentation

## 2021-01-21 NOTE — Telephone Encounter (Signed)
Reaching out to patient to offer assistance regarding upcoming cardiac imaging study; pt verbalizes understanding of appt date/time, parking situation and where to check in, pre-test NPO status and medications ordered, and verified current allergies; name and call back number provided for further questions should they arise  Kaytelynn Scripter RN Navigator Cardiac Imaging [facility] [phone] office [phone] cell  

## 2021-01-22 ENCOUNTER — Other Ambulatory Visit: Payer: Self-pay

## 2021-01-22 ENCOUNTER — Encounter (HOSPITAL_COMMUNITY): Payer: Self-pay

## 2021-01-22 ENCOUNTER — Ambulatory Visit (HOSPITAL_COMMUNITY)
Admission: RE | Admit: 2021-01-22 | Discharge: 2021-01-22 | Disposition: A | Discharge status: Home / Self Care | Payer: BC Managed Care – PPO | Source: Ambulatory Visit | Attending: Cardiology | Admitting: Cardiology

## 2021-01-22 DIAGNOSIS — Z01810 Encounter for preprocedural cardiovascular examination: Secondary | ICD-10-CM | POA: Diagnosis present

## 2021-01-22 DIAGNOSIS — Z006 Encounter for examination for normal comparison and control in clinical research program: Secondary | ICD-10-CM

## 2021-01-22 DIAGNOSIS — R0789 Other chest pain: Secondary | ICD-10-CM | POA: Insufficient documentation

## 2021-01-22 MED ORDER — IOHEXOL 350 MG/ML SOLN
80.0000 mL | Freq: Once | INTRAVENOUS | Status: AC | PRN
  Administered 2021-01-22: 80 mL via INTRAVENOUS

## 2021-01-22 MED ORDER — NITROGLYCERIN 0.4 MG SL SUBL
SUBLINGUAL_TABLET | SUBLINGUAL | Status: AC
  Filled 2021-01-22: qty 2

## 2021-01-22 MED ORDER — NITROGLYCERIN 0.4 MG SL SUBL
0.8000 mg | SUBLINGUAL_TABLET | Freq: Once | SUBLINGUAL | Status: AC
  Administered 2021-01-22: 0.8 mg via SUBLINGUAL

## 2021-01-22 NOTE — Research (Signed)
IDENTIFY Informed Consent                  Subject Name: Kayla Sanders    Subject met inclusion and exclusion criteria.  The informed consent form, study requirements and expectations were reviewed with the subject and questions and concerns were addressed prior to the signing of the consent form.  The subject verbalized understanding of the trial requirements.  The subject agreed to participate in the IDENTIFY trial and signed the informed consent at 11:22AM on 01/22/21.  The informed consent was obtained prior to performance of any protocol-specific procedures for the subject.  A copy of the signed informed consent was given to the subject and a copy was placed in the subject's medical record.   Meade Maw, Naval architect

## 2021-01-22 NOTE — Progress Notes (Signed)
CT scan completed. Tolerated well. D/C home ambulatory with husband. Awake and alert. In no distress. 

## 2021-01-23 ENCOUNTER — Ambulatory Visit (INDEPENDENT_AMBULATORY_CARE_PROVIDER_SITE_OTHER): Payer: BC Managed Care – PPO | Admitting: Neurology

## 2021-01-23 DIAGNOSIS — G4720 Circadian rhythm sleep disorder, unspecified type: Secondary | ICD-10-CM

## 2021-01-23 DIAGNOSIS — G4719 Other hypersomnia: Secondary | ICD-10-CM

## 2021-01-23 DIAGNOSIS — G4733 Obstructive sleep apnea (adult) (pediatric): Secondary | ICD-10-CM | POA: Diagnosis not present

## 2021-01-23 DIAGNOSIS — Z9989 Dependence on other enabling machines and devices: Secondary | ICD-10-CM

## 2021-01-23 DIAGNOSIS — R519 Headache, unspecified: Secondary | ICD-10-CM

## 2021-01-23 DIAGNOSIS — Z789 Other specified health status: Secondary | ICD-10-CM

## 2021-01-23 DIAGNOSIS — G479 Sleep disorder, unspecified: Secondary | ICD-10-CM

## 2021-01-31 ENCOUNTER — Telehealth: Payer: Self-pay | Admitting: *Deleted

## 2021-01-31 DIAGNOSIS — E782 Mixed hyperlipidemia: Secondary | ICD-10-CM

## 2021-01-31 MED ORDER — ROSUVASTATIN CALCIUM 5 MG PO TABS: 5.0000 mg | ORAL_TABLET | Freq: Every day | ORAL | 3 refills | Status: DC

## 2021-01-31 NOTE — Telephone Encounter (Signed)
Crestor sent to pharmacy.  Lab appointment scheduled.  Pt in agreement to try med.  She said she took something years ago because her CRP was elevated and that statin did not agree w her.  She will let us know if any muscle or joint pain/aches.

## 2021-01-31 NOTE — Telephone Encounter (Signed)
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 This should not be the cause of patients symptoms. Look for noncardiac causes of pain (GI possibly) I would recomm a statin for tighter control of lipids    Start Crestor 5 mg     F/U lipds and AST in 8 wks   Goal of LDL close to 70

## 2021-02-03 DIAGNOSIS — D126 Benign neoplasm of colon, unspecified: Secondary | ICD-10-CM

## 2021-02-03 HISTORY — DX: Benign neoplasm of colon, unspecified: D12.6

## 2021-02-05 ENCOUNTER — Telehealth: Payer: Self-pay | Admitting: Neurology

## 2021-02-05 NOTE — Telephone Encounter (Signed)
I called pt. I advised pt that Dr. Rexene Alberts reviewed their sleep study results and found that pt was best treated with CPAP 14 cm water pressure. Dr. Brett Fairy recommends that pt starts CPAP at a pressure of 14 cm water pressure. I reviewed PAP compliance expectations with the pt. Pt is agreeable to starting a CPAP. I advised pt that an order will be sent to a DME, [facility], and [facility] will call the pt within about one week after they file with the pt's insurance. [facility] will show the pt how to use the machine, fit for masks, and troubleshoot the CPAP if needed. A follow up appt was made for insurance purposes with Debbora Presto, NP on July 13,2022 at 3 pm. Pt verbalized understanding to arrive 15 minutes early and bring their CPAP. A letter with all of this information in it will be mailed to the pt as a reminder. I verified with the pt that the address we have on file is correct. Pt verbalized understanding of results. Pt had no questions at this time but was encouraged to call back if questions arise. I have sent the order to [facility] and have received confirmation that they have received the order.

## 2021-02-05 NOTE — Telephone Encounter (Signed)
-----   Message from [name], MD sent at 02/05/2021  7:41 AM EDT ----- Patient was referred by PCP for re-eval of her OSA. She has been on CPAP and may qualify for a new machine. She had a split night study on 01/23/21, which indicated/confirmed moderate OSA. She did well on CPAP of 14 cm.    Please call and inform patient that I have entered an order for treatment with positive airway pressure (PAP) treatment for obstructive sleep apnea (OSA). We can choose a new DME, per her preference. She will need a FU in 31-89 d post set up.  She has been compliant with her prior CPAP; please re-enforce the importance of compliance with treatment and the need for [place] to monitor compliance data - often an insurance requirement and actually good feedback for the patient as far as how they are doing.  Also remind patient, that any interim PAP machine or mask issues should be first addressed with the DME company, as they can often help better with technical and mask fit issues. Please ask if patient has a preference regarding DME company.  Please also make sure, the patient has a follow-up appointment with me in about 10 weeks from the setup date, thanks. May see one of our nurse practitioners if needed for proper timing of the FU appointment.  Please fax or rout report to the referring provider. Thanks,   [name], MD, PhD [facility])

## 2021-02-05 NOTE — Procedures (Signed)
PATIENT'S NAME:  Kayla Sanders, Spranger DOB:      [birth year]      MR#:    [id number]     DATE OF RECORDING: 60/01/2021 REFERRING M.D.:  Horald Pollen, MD Study Performed:  Split-Night Titration Study HISTORY: 60 year old woman with a history of vitamin D deficiency, hypertension, hyperlipidemia, reflux disease, mitral valve prolapse, migraine headaches, anxiety, asthma, and mild obesity, who was previously diagnosed with obstructive sleep apnea. She had a sleep study in 2017, which indicated moderate obstructive sleep apnea. She has been compliant with her CPAP of 12 cm, but her AHI suboptimal. The patient endorsed the Epworth Sleepiness Scale at 12 points. The patient's weight 166 pounds with a height of 61 (inches), resulting in a BMI of 31.2 kg/m2.   CURRENT MEDICATIONS: Xanax, Align, Dexilant, Pepcid, Allegra, Diflucan, Analpram-HC, Synthroid, Lopressor, Singulair, Benicar, Mycolog II, Patanol, Carafate, Valtrex, Drisdol  PROCEDURE:  This is a multichannel digital polysomnogram utilizing the Somnostar 11.2 system.  Electrodes and sensors were applied and monitored per AASM Specifications.   EEG, EOG, Chin and Limb EMG, were sampled at 200 Hz.  ECG, Snore and Nasal Pressure, Thermal Airflow, Respiratory Effort, CPAP Flow and Pressure, Oximetry was sampled at 50 Hz. Digital video and audio were recorded.      BASELINE STUDY WITHOUT CPAP RESULTS:  Lights Out was at 21:47 and Lights On at 04:36 for the night, split start was at 00:38, epoch 349. Total recording time (TRT) was 170, with a total sleep time (TST) of 126 minutes.   The patient's sleep latency was 49 minutes, which is delayed. REM latency was 97.5 minutes.  The sleep efficiency was 74.1 %.    SLEEP ARCHITECTURE: WASO (Wake after sleep onset) was 2.5 minutes, Stage N1 was 2.5 minutes, Stage N2 was 114.5 minutes, Stage N3 was 0 minutes and Stage R (REM sleep) was 9 minutes.  The percentages were Stage N1 2.%, Stage N2 90.9%, which is markedly  increased, Stage N3 was absent and Stage R (REM sleep) 7.1%. The arousals were noted as: 17 were spontaneous, 0 were associated with PLMs, 28 were associated with respiratory events.  RESPIRATORY ANALYSIS:  There were a total of 47 respiratory events:  44 obstructive apneas, 0 central apneas and 0 mixed apneas with a total of 44 apneas and an apnea index (AI) of 21.. There were 3 hypopneas with a hypopnea index of 1.4. The patient also had 1 respiratory event related arousals (RERAs).  Snoring was noted.     The total APNEA/HYPOPNEA INDEX (AHI) was 22.4 /hour and the total RESPIRATORY DISTURBANCE INDEX was 22.9 /hour.  12 events occurred in REM sleep and 6 events in NREM. The REM AHI was 80, /hour versus a non-REM AHI of 17.9 /hour. The patient spent 199 minutes sleep time in the supine position 90 minutes in non-supine. The supine AHI was 67.3 /hour versus a non-supine AHI of 0.7 /hour.  OXYGEN SATURATION & C02:  The wake baseline 02 saturation was 94%, with the lowest being 89%. Time spent below 89% saturation equaled 0 minutes. PERIODIC LIMB MOVEMENTS: The patient had a total of 0 Periodic Limb Movements.  The Periodic Limb Movement (PLM) index was 0 /hour and the PLM Arousal index was 0 /hour.  Audio and video analysis did not show any abnormal or unusual movements, behaviors, phonations or vocalizations. The patient took 2 bathroom breaks. The EKG was in keeping with normal sinus rhythm (NSR)  TITRATION STUDY WITH CPAP RESULTS:   The patient was fitted  with a small Simplus FFM and later changed to her home mask Amara. CPAP was initiated at 5 cmH20 with heated humidity per AASM split night standards and pressure was advanced to 14 cmH20 because of hypopneas, apneas and desaturations.  At a PAP pressure of 14 cmH20, there was a reduction of the AHI to 0.0/hour, with brief supine REM sleep achieved and O2 nadir of 90%.   Total recording time (TRT) was 239.5 minutes, with a total sleep time (TST) of  162.5 minutes. The patient's sleep latency was 88.5 minutes. REM latency was 123.5 minutes.  The sleep efficiency was 67.8%.    SLEEP ARCHITECTURE: Wake after sleep was 56 minutes, Stage N1 12.5 minutes, Stage N2 143 minutes, Stage N3 0 minutes and Stage R (REM sleep) 7 minutes. The percentages were: Stage N1 7.7%, Stage N2 88.%, Stage N3 was absent, and Stage R (REM sleep) 4.3%,which is markedly reduced. The arousals were noted as: 66 were spontaneous, 0 were associated with PLMs, 20 were associated with respiratory events.  RESPIRATORY ANALYSIS:  There were a total of 23 respiratory events: 11 obstructive apneas, 0 central apneas and 0 mixed apneas with a total of 11 apneas and an apnea index (AI) of 4.1. There were 12 hypopneas with a hypopnea index of 4.4 /hour. The patient also had 0 respiratory event related arousals (RERAs).      The total APNEA/HYPOPNEA INDEX (AHI) was 8.5/hour and the total RESPIRATORY DISTURBANCE INDEX was 8.5/hour. 2 events occurred in REM sleep and 21 events in NREM. The REM AHI was 17.1 /hour versus a non-REM AHI of 8.1/hour. The patient spent 97% of total sleep time in the supine position. The supine AHI was 8.4 /hour, versus a non-supine AHI of 13.3/hour.  OXYGEN SATURATION & C02: The wake baseline 02 saturation was 93%, with the lowest being 87%. Time spent below 89% saturation equaled 2 minutes.  PERIODIC LIMB MOVEMENTS: The patient had a total of 0 Periodic Limb Movements. The Periodic Limb Movement (PLM) index was 0 /hour and the PLM Arousal index was 0 /hour.  Post-study, the patient indicated that sleep was the same as usual.   POLYSOMNOGRAPHY IMPRESSION:   1. Obstructive Sleep Apnea (OSA)  2. Dysfunctions associated with sleep stages or arousals from sleep  RECOMMENDATIONS: 1. This patient has moderate obstructive sleep apnea and responded well to CPAP therapy. I will, therefore, start the patient on home CPAP treatment with a new machine and mask of choice,  at a pressure of 14 cm. The patient will be advise to continue with full compliance with PAP therapy to improve sleep related symptoms and decrease long term cardiovascular risks. Please note that untreated obstructive sleep apnea may carry additional perioperative morbidity. Patients with significant obstructive sleep apnea should receive perioperative PAP therapy and the surgeons and particularly the anesthesiologist should be informed of the diagnosis and the severity of the sleep disordered breathing. 2. This study shows sleep fragmentation and abnormal sleep stage percentages; these are nonspecific findings and per se do not signify an intrinsic sleep disorder or a cause for the patient's sleep-related symptoms. Causes include (but are not limited to) the first night effect of the sleep study, circadian rhythm disturbances, medication effect or an underlying mood disorder or medical problem.  3. The patient should be cautioned not to drive, work at heights, or operate dangerous or heavy equipment when tired or sleepy. Review and reiteration of good sleep hygiene measures should be pursued with any patient. 4. The patient will  be seen in follow-up in the sleep clinic at [facility] for discussion of the test results, symptom and treatment compliance review, further management strategies, etc. The referring provider will be notified of the test results.  I certify that I have reviewed the entire raw data recording prior to the issuance of this report in accordance with the Standards of Accreditation of the American Academy of Sleep Medicine (AASM)  [name], MD, PhD Diplomat, American Board of Neurology and Sleep Medicine (Neurology and Sleep Medicine)

## 2021-02-05 NOTE — Progress Notes (Signed)
Patient was referred by PCP for re-eval of her OSA. She has been on CPAP and may qualify for a new machine. She had a split night study on 01/23/21, which indicated/confirmed moderate OSA. She did well on CPAP of 14 cm.   Please call and inform patient that I have entered an order for treatment with positive airway pressure (PAP) treatment for obstructive sleep apnea (OSA). We can choose a new DME, per her preference. She will need a FU in 31-89 d post set up.  She has been compliant with her prior CPAP; please re-enforce the importance of compliance with treatment and the need for [place] to monitor compliance data - often an insurance requirement and actually good feedback for the patient as far as how they are doing.  Also remind patient, that any interim PAP machine or mask issues should be first addressed with the DME company, as they can often help better with technical and mask fit issues. Please ask if patient has a preference regarding DME company.  Please also make sure, the patient has a follow-up appointment with me in about 10 weeks from the setup date, thanks. May see one of our nurse practitioners if needed for proper timing of the FU appointment.  Please fax or rout report to the referring provider. Thanks,   [name], MD, PhD [facility])

## 2021-02-05 NOTE — Addendum Note (Signed)
Addended by: [name] on: 02/05/2021 07:41 AM   Modules accepted: Orders

## 2021-02-14 ENCOUNTER — Encounter: Payer: Self-pay | Admitting: Neurology

## 2021-02-17 DIAGNOSIS — R002 Palpitations: Secondary | ICD-10-CM

## 2021-02-20 ENCOUNTER — Encounter: Payer: Self-pay | Admitting: Gastroenterology

## 2021-03-12 ENCOUNTER — Telehealth: Payer: Self-pay | Admitting: *Deleted

## 2021-03-12 MED ORDER — METOPROLOL SUCCINATE ER 25 MG PO TB24: 25.0000 mg | ORAL_TABLET | Freq: Every day | ORAL | 1 refills | Status: DC

## 2021-03-12 NOTE — Telephone Encounter (Signed)
-----   Message from Dorris Carnes V, MD sent at 03/10/2021  2:25 PM EDT ----- Patients monitor showed SR  There were occasional skips from lower and upper chambers   Most isolated   Occasional short bursts from uper chambers.   Occasionally the palpitations correlated with ectopy   Other times there was no associated arrhythmia    Could try low dose Toprol XL 25 mg   to see if slowing heart rate a bit she feels less   Could even adjust if feels some better  None of skips are concerning for anything dangerous Call in a couple months with how doing

## 2021-03-12 NOTE — Telephone Encounter (Signed)
Spoke to patient about starting toprol xl 25mg  daily to see if slowing heart rate causes the patient to feel less skips.  Patient is agreeable to medication.  Patient made aware that there not dangerous and patient will update with how she is feeling in a couple of months.

## 2021-04-08 ENCOUNTER — Other Ambulatory Visit: Payer: BC Managed Care – PPO

## 2021-04-10 ENCOUNTER — Ambulatory Visit (INDEPENDENT_AMBULATORY_CARE_PROVIDER_SITE_OTHER): Payer: BC Managed Care – PPO | Admitting: Gastroenterology

## 2021-04-10 VITALS — BP 110/72 | HR 78 | Ht 61.0 in | Wt 166.1 lb

## 2021-04-10 DIAGNOSIS — R1310 Dysphagia, unspecified: Secondary | ICD-10-CM | POA: Diagnosis not present

## 2021-04-10 DIAGNOSIS — Z86010 Personal history of colonic polyps: Secondary | ICD-10-CM

## 2021-04-10 DIAGNOSIS — K9089 Other intestinal malabsorption: Secondary | ICD-10-CM | POA: Diagnosis not present

## 2021-04-10 DIAGNOSIS — K219 Gastro-esophageal reflux disease without esophagitis: Secondary | ICD-10-CM

## 2021-04-10 MED ORDER — PLENVU 140 G PO SOLR: ORAL | 0 refills | Status: DC

## 2021-04-10 MED ORDER — CHOLESTYRAMINE LIGHT 4 G PO PACK: 4.0000 g | PACK | Freq: Every day | ORAL | 1 refills | Status: DC

## 2021-04-10 MED ORDER — NA SULFATE-K SULFATE-MG SULF 17.5-3.13-1.6 GM/177ML PO SOLN: ORAL | 0 refills | Status: DC

## 2021-04-10 NOTE — Progress Notes (Signed)
Kayla Sanders    [id number]    1961/06/27  Primary Care Physician:Richter, Maebelle Munroe, MD  Referring Physician: Hayden Rasmussen, MD [address] [place],  [place] [address]   Chief complaint: Family history of colon cancer, GERD  HPI: 60 year old very pleasant female previously followed by Dr. Collene Mares is here to establish care She has family history of colon cancer in her mother, resected colon cancer specimen was positive for Lynch syndrome mutation.  Patient underwent genetic testing, was not positive for Lynch syndrome but had MUTYH gene mutation for familial adenomatous polyposis syndrome and recommendation was for patient to have colonoscopy every 5 years  She is experiencing persistent GERD symptoms despite taking Dexilant daily and Pepcid at bedtime.  She also has difficulty swallowing especially worse with pills No vomiting.  She is s/p cholecystectomy and since she had her gallbladder removed she is having increased fecal urgency and semiformed to liquid stool, worse in the morning after breakfast  She has obstructive sleep apnea and was told she has difficult airway by anesthesia in the past  EGD October 09, 2016: By Dr. Benson Norway Per report was normal exam.  Esophagus was attempted to dilate with 18 mm Savary which was unsuccessful subsequently used 62mm Savary with mild resistance Colonoscopy October 09, 2016 by Dr. Benson Norway - Two 2 to 3 mm polyps [tubular adenomas] in the ascending colon, removed with a cold snare. Resected and retrieved.  Outpatient Encounter Medications as of 04/10/2021  Medication Sig  . ALPRAZolam (XANAX) 0.5 MG tablet Take 1 tablet (0.5 mg total) by mouth 3 (three) times daily as needed for sleep or anxiety.  . bifidobacterium infantis (ALIGN) capsule Take 1 capsule by mouth daily. Generic from [facility] (member mark)  . conjugated estrogens (PREMARIN) vaginal cream Place 1 Applicatorful vaginally daily.  Marland Kitchen dexlansoprazole  (DEXILANT) 60 MG capsule Take 60 mg by mouth daily.  . famotidine (PEPCID) 20 MG tablet Take 40 mg by mouth at bedtime.  . fexofenadine (ALLEGRA) 180 MG tablet Take 180 mg by mouth every evening.  . fluconazole (DIFLUCAN) 100 MG tablet Take 100 mg by mouth daily as needed. For yeast at caesarean scar.  . hydrocortisone-pramoxine (ANALPRAM-HC) 2.5-1 % rectal cream Place 1 application rectally daily as needed. For hemorrhoids  . levothyroxine (SYNTHROID) 25 MCG tablet Take 25 mcg by mouth daily.  . metoprolol succinate (TOPROL XL) 25 MG 24 hr tablet Take 1 tablet (25 mg total) by mouth daily.  . montelukast (SINGULAIR) 10 MG tablet Take 1 tablet (10 mg total) by mouth at bedtime.  Marland Kitchen nystatin-triamcinolone (MYCOLOG II) cream Apply 1 application topically 2 (two) times daily as needed. For cesarean incision (yeast at the site)  . olmesartan (BENICAR) 20 MG tablet Take 20 mg by mouth daily.  Marland Kitchen olopatadine (PATANOL) 0.1 % ophthalmic solution Place 1-2 drops into both eyes 2 (two) times daily as needed. For dry/irritated eyes  . rosuvastatin (CRESTOR) 5 MG tablet Take 1 tablet (5 mg total) by mouth daily.  . sucralfate (CARAFATE) 1 GM/10ML suspension Take 10 mLs by mouth 4 (four) times daily as needed. For meals and at bedtime.  . valACYclovir (VALTREX) 500 MG tablet Take 500 mg by mouth as needed.  . Vitamin D, Ergocalciferol, (DRISDOL) 1.25 MG (50000 UNIT) CAPS capsule Take 50,000 Units by mouth once a week.   No facility-administered encounter medications on file as of 04/10/2021.  Allergies as of 04/10/2021 - Review Complete 01/22/2021  Allergen Reaction Noted  . Pseudoephedrine hcl Palpitations 11/11/2015  . Ephedrine  11/02/2012  . Fentanyl  10/01/2016    Past Medical History:  Diagnosis Date  . Anxiety   . Anxiety   . Asthma   . Complication of anesthesia    told by anesthesia had small airway  . Family history of adverse reaction to anesthesia    daughter has severe ponv  .  Fibromyalgia   . GERD (gastroesophageal reflux disease)   . Hyperlipidemia   . Hypertension   . Insomnia   . Migraines   . Mitral valve prolapse   . OSA (obstructive sleep apnea)    on CPAP  . PONV (postoperative nausea and vomiting)    with general anesthesia severe ponv, does ok with propofol  . Vitamin D deficiency     Past Surgical History:  Procedure Laterality Date  . ABDOMINAL HYSTERECTOMY     partial  . BRAVO [place] STUDY N/A 02/06/2013   Procedure: BRAVO [place];  Surgeon: Juanita Craver, MD;  Location: [facility]COPY;  Service: Endoscopy;  Laterality: N/A;  . CARPAL TUNNEL RELEASE Right   . CHOLECYSTECTOMY    . COLONOSCOPY WITH PROPOFOL N/A 10/09/2016   Procedure: COLONOSCOPY WITH PROPOFOL;  Surgeon: Carol Ada, MD;  Location: [facility];  Service: Endoscopy;  Laterality: N/A;  . colonscopy  2012  . ESOPHAGOGASTRODUODENOSCOPY N/A 02/06/2013   Procedure: ESOPHAGOGASTRODUODENOSCOPY (EGD);  Surgeon: Juanita Craver, MD;  Location: [facility];  Service: Endoscopy;  Laterality: N/A;  . ESOPHAGOGASTRODUODENOSCOPY (EGD) WITH PROPOFOL N/A 10/09/2016   Procedure: ESOPHAGOGASTRODUODENOSCOPY (EGD) WITH PROPOFOL;  Surgeon: Carol Ada, MD;  Location: [facility];  Service: Endoscopy;  Laterality: N/A;  . HEMORRHOIDECTOMY WITH HEMORRHOID BANDING    . NASAL SEPTUM SURGERY    . RECTOCELE REPAIR    . TMJ ARTHROPLASTY     and removal  . TUBAL LIGATION    . VESICOVAGINAL FISTULA CLOSURE W/ TAH      Family History  Problem Relation Age of Onset  . Arthritis Mother   . Hyperlipidemia Mother   . Hypertension Mother   . Sleep apnea Mother   . Arthritis Father   . Hyperlipidemia Father   . Heart disease Father   . Stroke Father   . Hypertension Father   . Sleep apnea Father   . Arthritis Other   . Breast cancer Other   . Diabetes Other   . Sleep apnea Brother     Social History   Socioeconomic History  . Marital status: Married    Spouse name: Not on file  . Number of  children: 3  . Years of education: BS  . Highest education level: Not on file  Occupational History  . Occupation: [employer]  Tobacco Use  . Smoking status: Former Smoker    Quit date: 11/23/1981    Years since quitting: 39.4  . Smokeless tobacco: Never Used  Substance and Sexual Activity  . Alcohol use: Yes    Alcohol/week: 0.0 standard drinks    Comment: Rare  . Drug use: No  . Sexual activity: Not on file  Other Topics Concern  . Not on file  Social History Narrative   Married with grown children. Works for [employer] as a Teacher, early years/pre. Nonsmoker, quit in 1983. Rare social Etoh.   Consumes about 3+ caffeine drinks a day    Social Determinants of Radio broadcast assistant Strain: Not on file  Food  Insecurity: Not on file  Transportation Needs: Not on file  Physical Activity: Not on file  Stress: Not on file  Social Connections: Not on file  Intimate Partner Violence: Not on file      Review of systems: All other review of systems negative except as mentioned in the HPI.   Physical Exam: Vitals:   04/10/21 0859  BP: 110/72  Pulse: 78   Body mass index is 31.39 kg/m. Gen:      No acute distress HEENT:  sclera anicteric Abd:      soft, non-tender; no palpable masses, no distension Ext:    No edema Neuro: alert and oriented x 3 Psych: normal mood and affect  Data Reviewed:  Reviewed labs, radiology imaging, old records and pertinent past GI work up   Assessment and Plan/Recommendations:  60 year old female with family history of colon cancer and personal history of colon polyps Due for surveillance colonoscopy, will schedule a colonoscopy at [facility]  Persistent GERD symptoms despite high-dose PPI and intermittent dysphagia We will plan for EGD for further evaluation, exclude erosive esophagitis or eosinophilic esophagitis +/- esophageal dilation and 48-hour pH Bravo placement if EGD unremarkable Advised patient to stop PPI 7 days prior to  the procedure and hold H2 blocker 48 hours prior to the procedure  If she has evidence of significant GERD, will consider referral for TIF or fundoplication  S/p cholecystectomy bile salt induced diarrhea: Start Prevalite 4 g daily.  Advised patient to avoid taking it within 3 to 4 hours of medication to avoid drug interaction  The risks and benefits as well as alternatives of endoscopic procedure(s) have been discussed and reviewed. All questions answered. The patient agrees to proceed.  This visit required >80 minutes of patient care (this includes precharting, chart review, review of results, face-to-face time used for counseling as well as treatment plan and follow-up. The patient was provided an opportunity to ask questions and all were answered. The patient agreed with the plan and demonstrated an understanding of the instructions.  Damaris Hippo , MD    CC: Hayden Rasmussen, MD

## 2021-04-10 NOTE — Patient Instructions (Addendum)
You have been scheduled for a Colonoscopy/Endoscopy at [facility] on 06/12/2021 at 11:00am. Separate instructions have been given. You will need to be off Dexilant 7 days before procedure  Stop Pepcid for 2 days before procedure, ok to continue to use the carafate  Take Prevalite 4 gm daily before supper at 5pm   Due to recent changes in healthcare laws, you may see the results of your imaging and laboratory studies on MyChart before your provider has had a chance to review them.  We understand that in some cases there may be results that are confusing or concerning to you. Not all laboratory results come back in the same time frame and the provider may be waiting for multiple results in order to interpret others.  Please give [place] 48 hours in order for your provider to thoroughly review all the results before contacting the office for clarification of your results.   I appreciate the  opportunity to care for you  Thank You   Harl Bowie , MD

## 2021-04-15 ENCOUNTER — Other Ambulatory Visit: Payer: BC Managed Care – PPO | Admitting: *Deleted

## 2021-04-15 ENCOUNTER — Other Ambulatory Visit: Payer: Self-pay

## 2021-04-15 DIAGNOSIS — E782 Mixed hyperlipidemia: Secondary | ICD-10-CM

## 2021-04-15 LAB — LIPID PANEL
Chol/HDL Ratio: 2.4 ratio (ref 0.0–4.4)
Cholesterol, Total: 153 mg/dL (ref 100–199)
HDL: 63 mg/dL (ref >39)
LDL Chol Calc (NIH): 75 mg/dL (ref 0–99)
Triglycerides: 81 mg/dL (ref 0–149)
VLDL Cholesterol Cal: 15 mg/dL (ref 5–40)

## 2021-04-15 LAB — AST: AST: 15 IU/L (ref 0–40)

## 2021-04-16 ENCOUNTER — Ambulatory Visit: Payer: BC Managed Care – PPO | Admitting: Gastroenterology

## 2021-05-04 ENCOUNTER — Other Ambulatory Visit: Payer: Self-pay | Admitting: Gastroenterology

## 2021-05-22 ENCOUNTER — Telehealth: Payer: Self-pay

## 2021-05-22 DIAGNOSIS — Z006 Encounter for examination for normal comparison and control in clinical research program: Secondary | ICD-10-CM

## 2021-05-22 NOTE — Telephone Encounter (Signed)
I have attempted without success to contact this patient by phone for her Identify 90 day follow up phone call. I left a message for patient to return my phone call with my name and callback number. An e-mail was also sent to patient.  

## 2021-05-28 ENCOUNTER — Encounter: Payer: Self-pay | Admitting: Family Medicine

## 2021-05-29 ENCOUNTER — Telehealth: Payer: Self-pay | Admitting: Neurology

## 2021-05-29 DIAGNOSIS — G4733 Obstructive sleep apnea (adult) (pediatric): Secondary | ICD-10-CM

## 2021-05-29 DIAGNOSIS — Z9989 Dependence on other enabling machines and devices: Secondary | ICD-10-CM

## 2021-05-29 NOTE — Telephone Encounter (Signed)
I reviewed her CPAP compliance data from 04/30/21 through 05/29/21, which is a total of 30 days. Patient has been compliant with treatment, average usage of 5 hours and 43 min. She does have and elevated AHI of 11.9/hour on a pressure of 14 cm, but the problem is, the report does not tell us, if the events are obstructive or central. We can try increasing the pressure by 1 cm to 15 cm and review her download in another month. Sometimes, if events are central apneas and not obstructive the pressure increase makes the events worse. But we can see how it goes on the 15 cm. Please advise patient of the pressure increase and fax order to [place] if she is agreeable. Please ask her to check in via [facility] email in one month and we can review another download then. Also, once she has a new CPAP machine, she will need an office visit within 31 to 89 days; I believe, she is aware.

## 2021-05-29 NOTE — Telephone Encounter (Signed)
I will send the order for the increase pressure change to [place] for them to make the change and let the pt know.

## 2021-06-02 ENCOUNTER — Other Ambulatory Visit: Payer: Self-pay | Admitting: Neurology

## 2021-06-02 DIAGNOSIS — G4733 Obstructive sleep apnea (adult) (pediatric): Secondary | ICD-10-CM

## 2021-06-02 DIAGNOSIS — Z9989 Dependence on other enabling machines and devices: Secondary | ICD-10-CM

## 2021-06-02 DIAGNOSIS — G4719 Other hypersomnia: Secondary | ICD-10-CM

## 2021-06-02 DIAGNOSIS — G479 Sleep disorder, unspecified: Secondary | ICD-10-CM

## 2021-06-02 DIAGNOSIS — R519 Headache, unspecified: Secondary | ICD-10-CM

## 2021-06-04 ENCOUNTER — Ambulatory Visit: Payer: Self-pay | Admitting: Family Medicine

## 2021-06-06 ENCOUNTER — Encounter (HOSPITAL_COMMUNITY): Payer: Self-pay | Admitting: Gastroenterology

## 2021-06-06 ENCOUNTER — Other Ambulatory Visit: Payer: Self-pay

## 2021-06-12 ENCOUNTER — Encounter (HOSPITAL_COMMUNITY)
Admission: RE | Disposition: A | Discharge status: Home / Self Care | Payer: Self-pay | Source: Ambulatory Visit | Attending: Gastroenterology

## 2021-06-12 ENCOUNTER — Ambulatory Visit (HOSPITAL_COMMUNITY)
Admission: RE | Admit: 2021-06-12 | Discharge: 2021-06-12 | Disposition: A | Discharge status: Home / Self Care | Payer: BC Managed Care – PPO | Source: Ambulatory Visit | Attending: Gastroenterology | Admitting: Gastroenterology

## 2021-06-12 ENCOUNTER — Ambulatory Visit (HOSPITAL_COMMUNITY): Payer: BC Managed Care – PPO | Admitting: Certified Registered Nurse Anesthetist

## 2021-06-12 ENCOUNTER — Other Ambulatory Visit: Payer: Self-pay

## 2021-06-12 ENCOUNTER — Encounter (HOSPITAL_COMMUNITY): Payer: Self-pay | Admitting: Gastroenterology

## 2021-06-12 DIAGNOSIS — K648 Other hemorrhoids: Secondary | ICD-10-CM | POA: Diagnosis not present

## 2021-06-12 DIAGNOSIS — Z1509 Genetic susceptibility to other malignant neoplasm: Secondary | ICD-10-CM | POA: Diagnosis not present

## 2021-06-12 DIAGNOSIS — Z79890 Hormone replacement therapy (postmenopausal): Secondary | ICD-10-CM | POA: Diagnosis not present

## 2021-06-12 DIAGNOSIS — R1310 Dysphagia, unspecified: Secondary | ICD-10-CM

## 2021-06-12 DIAGNOSIS — K319 Disease of stomach and duodenum, unspecified: Secondary | ICD-10-CM | POA: Insufficient documentation

## 2021-06-12 DIAGNOSIS — K2101 Gastro-esophageal reflux disease with esophagitis, with bleeding: Secondary | ICD-10-CM | POA: Insufficient documentation

## 2021-06-12 DIAGNOSIS — Z87891 Personal history of nicotine dependence: Secondary | ICD-10-CM | POA: Diagnosis not present

## 2021-06-12 DIAGNOSIS — K2980 Duodenitis without bleeding: Secondary | ICD-10-CM | POA: Insufficient documentation

## 2021-06-12 DIAGNOSIS — K5521 Angiodysplasia of colon with hemorrhage: Secondary | ICD-10-CM | POA: Diagnosis not present

## 2021-06-12 DIAGNOSIS — D123 Benign neoplasm of transverse colon: Secondary | ICD-10-CM | POA: Insufficient documentation

## 2021-06-12 DIAGNOSIS — Z1211 Encounter for screening for malignant neoplasm of colon: Secondary | ICD-10-CM | POA: Diagnosis not present

## 2021-06-12 DIAGNOSIS — Z79899 Other long term (current) drug therapy: Secondary | ICD-10-CM | POA: Insufficient documentation

## 2021-06-12 DIAGNOSIS — Z888 Allergy status to other drugs, medicaments and biological substances status: Secondary | ICD-10-CM | POA: Diagnosis not present

## 2021-06-12 DIAGNOSIS — Z885 Allergy status to narcotic agent status: Secondary | ICD-10-CM | POA: Diagnosis not present

## 2021-06-12 DIAGNOSIS — Z86010 Personal history of colonic polyps: Secondary | ICD-10-CM | POA: Diagnosis present

## 2021-06-12 DIAGNOSIS — K9089 Other intestinal malabsorption: Secondary | ICD-10-CM

## 2021-06-12 DIAGNOSIS — K269 Duodenal ulcer, unspecified as acute or chronic, without hemorrhage or perforation: Secondary | ICD-10-CM | POA: Insufficient documentation

## 2021-06-12 DIAGNOSIS — Z9049 Acquired absence of other specified parts of digestive tract: Secondary | ICD-10-CM | POA: Diagnosis not present

## 2021-06-12 DIAGNOSIS — K219 Gastro-esophageal reflux disease without esophagitis: Secondary | ICD-10-CM

## 2021-06-12 DIAGNOSIS — K644 Residual hemorrhoidal skin tags: Secondary | ICD-10-CM | POA: Insufficient documentation

## 2021-06-12 DIAGNOSIS — K2971 Gastritis, unspecified, with bleeding: Secondary | ICD-10-CM | POA: Insufficient documentation

## 2021-06-12 DIAGNOSIS — K2210 Ulcer of esophagus without bleeding: Secondary | ICD-10-CM | POA: Insufficient documentation

## 2021-06-12 HISTORY — PX: ESOPHAGOGASTRODUODENOSCOPY (EGD) WITH PROPOFOL: SHX5813

## 2021-06-12 HISTORY — PX: HOT HEMOSTASIS: SHX5433

## 2021-06-12 HISTORY — PX: BIOPSY: SHX5522

## 2021-06-12 HISTORY — PX: POLYPECTOMY: SHX5525

## 2021-06-12 HISTORY — PX: COLONOSCOPY WITH PROPOFOL: SHX5780

## 2021-06-12 SURGERY — ESOPHAGOGASTRODUODENOSCOPY (EGD) WITH PROPOFOL
Anesthesia: Monitor Anesthesia Care

## 2021-06-12 MED ORDER — PROPOFOL 500 MG/50ML IV EMUL
INTRAVENOUS | Status: AC
  Filled 2021-06-12: qty 50

## 2021-06-12 MED ORDER — PROPOFOL 500 MG/50ML IV EMUL
INTRAVENOUS | Status: DC | PRN
  Administered 2021-06-12: 125 ug/kg/min via INTRAVENOUS

## 2021-06-12 MED ORDER — SUCRALFATE 1 G PO TABS: 1.0000 g | ORAL_TABLET | Freq: Four times a day (QID) | ORAL | 1 refills | Status: DC

## 2021-06-12 MED ORDER — LIDOCAINE 2% (20 MG/ML) 5 ML SYRINGE
INTRAMUSCULAR | Status: DC | PRN
  Administered 2021-06-12: 60 mg via INTRAVENOUS

## 2021-06-12 MED ORDER — LACTATED RINGERS IV SOLN: INTRAVENOUS | Status: DC

## 2021-06-12 MED ORDER — SODIUM CHLORIDE 0.9 % IV SOLN
INTRAVENOUS | Status: DC
Start: 2021-06-12 — End: 2021-06-12

## 2021-06-12 MED ORDER — ONDANSETRON HCL 4 MG/2ML IJ SOLN
INTRAMUSCULAR | Status: DC | PRN
  Administered 2021-06-12: 4 mg via INTRAVENOUS

## 2021-06-12 MED ORDER — PROPOFOL 10 MG/ML IV BOLUS
INTRAVENOUS | Status: DC | PRN
  Administered 2021-06-12 (×3): 10 mg via INTRAVENOUS

## 2021-06-12 SURGICAL SUPPLY — 25 items

## 2021-06-12 NOTE — Op Note (Signed)
[facility] Patient Name: Ivory Maduro Procedure Date: 06/12/2021 MRN: [id number] Attending MD: Mauri Pole , MD Date of Birth: 08/02/1961 CSN: [id number] Age: 60 Admit Type: Outpatient Procedure:                Colonoscopy Indications:              High risk colon cancer surveillance: Personal                            history of colonic polyps. H/o polyposis gene                            mutation MUTYH Providers:                Mauri Pole, MD, Mariana Arn, Laverda Sorenson, Technician, Particia Nearing, RN Referring MD:              Medicines:                Monitored Anesthesia Care Complications:            No immediate complications. Estimated Blood Loss:     Estimated blood loss was minimal. Procedure:                Pre-Anesthesia Assessment:                           - Prior to the procedure, a History and Physical                            was performed, and patient medications and                            allergies were reviewed. The patient's tolerance of                            previous anesthesia was also reviewed. The risks                            and benefits of the procedure and the sedation                            options and risks were discussed with the patient.                            All questions were answered, and informed consent                            was obtained. Prior Anticoagulants: The patient has                            taken no previous anticoagulant or antiplatelet                            agents.  ASA Grade Assessment: II - A patient with                            mild systemic disease. After reviewing the risks                            and benefits, the patient was deemed in                            satisfactory condition to undergo the procedure.                           After obtaining informed consent, the colonoscope                            was passed  under direct vision. Throughout the                            procedure, the patient's blood pressure, pulse, and                            oxygen saturations were monitored continuously. The                            PCF-H190DL ([id number] Olympus pediatric colonscope                            was introduced through the anus and advanced to the                            the cecum, identified by appendiceal orifice and                            ileocecal valve. The colonoscopy was performed                            without difficulty. The patient tolerated the                            procedure well. The quality of the bowel                            preparation was good. The ileocecal valve,                            appendiceal orifice, and rectum were photographed. Scope In: 11:22:53 AM Scope Out: 11:39:06 AM Scope Withdrawal Time: 0 hours 12 minutes 40 seconds  Total Procedure Duration: 0 hours 16 minutes 13 seconds  Findings:      The perianal and digital rectal examinations were normal.      A single small angiodysplastic lesion with bleeding was found in the       cecum. Coagulation for hemostasis using argon plasma was successful.      A less than 1 mm polyp was found in the appendiceal  orifice. The polyp       was sessile. The polyp was removed with a cold biopsy forceps. Resection       and retrieval were complete.      A 4 mm polyp was found in the transverse colon. The polyp was sessile.       The polyp was removed with a hot snare. Resection and retrieval were       complete.      Non-bleeding external and internal hemorrhoids were found during       retroflexion. The hemorrhoids were medium-sized. Impression:               - A single bleeding colonic angiodysplastic lesion.                            Treated with argon plasma coagulation (APC).                           - One less than 1 mm polyp at the appendiceal                            orifice, removed with  a cold biopsy forceps.                            Resected and retrieved.                           - One 4 mm polyp in the transverse colon, removed                            with a hot snare. Resected and retrieved.                           - Non-bleeding external and internal hemorrhoids. Moderate Sedation:      Not Applicable - Patient had care per Anesthesia. Recommendation:           - Patient has a contact number available for                            emergencies. The signs and symptoms of potential                            delayed complications were discussed with the                            patient. Return to normal activities tomorrow.                            Written discharge instructions were provided to the                            patient.                           - Resume previous diet.                           -  Continue present medications.                           - Await pathology results.                           - Repeat colonoscopy in 5 years for surveillance                            based on pathology results. Procedure Code(s):        --- Professional ---                           [phone], 59, Colonoscopy, flexible; with control of                            bleeding, any method                           45385, Colonoscopy, flexible; with removal of                            tumor(s), polyp(s), or other lesion(s) by snare                            technique                           45380, 28, Colonoscopy, flexible; with biopsy,                            single or multiple Diagnosis Code(s):        --- Professional ---                           Z86.010, Personal history of colonic polyps                           K55.21, Angiodysplasia of colon with hemorrhage                           K63.5, Polyp of colon                           K64.8, Other hemorrhoids CPT copyright 2019 [employer]. All rights reserved. The codes  documented in this report are preliminary and upon coder review may  be revised to meet current compliance requirements. Mauri Pole, MD 06/12/2021 12:01:54 PM This report has been signed electronically. Number of Addenda: 0

## 2021-06-12 NOTE — Transfer of Care (Signed)
Immediate Anesthesia Transfer of Care Note  Patient: Kayla Sanders  Procedure(s) Performed: ESOPHAGOGASTRODUODENOSCOPY (EGD) WITH PROPOFOL COLONOSCOPY WITH PROPOFOL POLYPECTOMY HOT HEMOSTASIS (ARGON PLASMA COAGULATION/BICAP) BIOPSY  Patient Location: PACU and Endoscopy Unit  Anesthesia Type:MAC  Level of Consciousness: awake, alert  and oriented  Airway & Oxygen Therapy: Patient Spontanous Breathing and Patient connected to face mask oxygen  Post-op Assessment: Report given to RN and Post -op Vital signs reviewed and stable  Post vital signs: Reviewed and stable  Last Vitals:  Vitals Value Taken Time  BP    Temp    Pulse    Resp 5 06/12/21 1153  SpO2    Vitals shown include unvalidated device data.  Last Pain:  Vitals:   06/12/21 1013  TempSrc: Oral  PainSc: 0-No pain         Complications: No notable events documented.

## 2021-06-12 NOTE — Op Note (Addendum)
[facility] Patient Name: Kayla Sanders Procedure Date: 06/12/2021 MRN: [id number] Attending MD: Mauri Pole , MD Date of Birth: 1961/02/15 CSN: [id number] Age: 60 Admit Type: Outpatient Procedure:                Upper GI endoscopy Indications:              Esophageal reflux symptoms that persist despite                            appropriate therapy Providers:                Mauri Pole, MD, Mariana Arn, Laverda Sorenson, Technician, Particia Nearing, RN Referring MD:              Medicines:                Monitored Anesthesia Care Complications:            No immediate complications. Estimated Blood Loss:     Estimated blood loss was minimal. Procedure:                Pre-Anesthesia Assessment:                           - Prior to the procedure, a History and Physical                            was performed, and patient medications and                            allergies were reviewed. The patient's tolerance of                            previous anesthesia was also reviewed. The risks                            and benefits of the procedure and the sedation                            options and risks were discussed with the patient.                            All questions were answered, and informed consent                            was obtained. Prior Anticoagulants: The patient has                            taken no previous anticoagulant or antiplatelet                            agents. ASA Grade Assessment: II - A patient with  mild systemic disease. After reviewing the risks                            and benefits, the patient was deemed in                            satisfactory condition to undergo the procedure.                           After obtaining informed consent, the endoscope was                            passed under direct vision. Throughout the                             procedure, the patient's blood pressure, pulse, and                            oxygen saturations were monitored continuously. The                            [id number]-H190 ([id number] Olympus gastroscope was                            introduced through the mouth, and advanced to the                            second part of duodenum. The upper GI endoscopy was                            accomplished without difficulty. The patient                            tolerated the procedure well. Scope In: Scope Out: Findings:      LA Grade C (one or more mucosal breaks continuous between tops of 2 or       more mucosal folds, less than 75% circumference) esophagitis with       bleeding was found 33 to 36 cm from the incisors.      Few superficial esophageal ulcers were found 34 to 35 cm from the       incisors. The largest lesion was 5 mm in largest dimension.      Patchy moderate inflammation with hemorrhage characterized by adherent       blood, congestion (edema), erosions and erythema was found in the entire       examined stomach. Biopsies were taken with a cold forceps for       Helicobacter pylori testing.      Few non-bleeding superficial duodenal ulcers with no stigmata of       bleeding were found in the duodenal bulb and in the second portion of       the duodenum. The largest lesion was 8 mm in largest dimension. Biopsies       were taken with a cold forceps for histology. Impression:               - LA Grade  C reflux esophagitis with bleeding.                           - Esophageal ulcers.                           - Gastritis with hemorrhage. Biopsied.                           - Non-bleeding duodenal ulcers with no stigmata of                            bleeding. Biopsied. Moderate Sedation:      Not Applicable - Patient had care per Anesthesia. Recommendation:           - Resume previous diet.                           - Continue present medications.                           - Await  pathology results.                           - Continue Dexilant and Pepcid                           - Use sucralfate tablets 1 gram PO QID for 1 month.                           - No aspirin, ibuprofen, naproxen, or other                            non-steroidal anti-inflammatory drugs. Procedure Code(s):        --- Professional ---                           [phone], Esophagogastroduodenoscopy, flexible,                            transoral; with biopsy, single or multiple Diagnosis Code(s):        --- Professional ---                           K21.01, Gastro-esophageal reflux disease with                            esophagitis, with bleeding                           K22.10, Ulcer of esophagus without bleeding                           K29.71, Gastritis, unspecified, with bleeding                           K26.9, Duodenal ulcer, unspecified as acute or  chronic, without hemorrhage or perforation CPT copyright 2019 [employer] Association. All rights reserved. The codes documented in this report are preliminary and upon coder review may  be revised to meet current compliance requirements. Mauri Pole, MD 06/12/2021 12:05:41 PM This report has been signed electronically. Number of Addenda: 0

## 2021-06-12 NOTE — Discharge Instructions (Signed)
YOU HAD AN ENDOSCOPIC PROCEDURE TODAY: Refer to the procedure report and other information in the discharge instructions given to you for any specific questions about what was found during the examination. If this information does not answer your questions, please call [place] office at [phone] to clarify.  ° °YOU SHOULD EXPECT: Some feelings of bloating in the abdomen. Passage of more gas than usual. Walking can help get rid of the air that was put into your GI tract during the procedure and reduce the bloating. If you had a lower endoscopy (such as a colonoscopy or flexible sigmoidoscopy) you may notice spotting of blood in your stool or on the toilet paper. Some abdominal soreness may be present for a day or two, also. ° °DIET: Your first meal following the procedure should be a light meal and then it is ok to progress to your normal diet. A half-sandwich or bowl of soup is an example of a good first meal. Heavy or fried foods are harder to digest and may make you feel nauseous or bloated. Drink plenty of fluids but you should avoid alcoholic beverages for 24 hours. If you had a esophageal dilation, please see attached instructions for diet.   ° °ACTIVITY: Your care partner should take you home directly after the procedure. You should plan to take it easy, moving slowly for the rest of the day. You can resume normal activity the day after the procedure however YOU SHOULD NOT DRIVE, use power tools, machinery or perform tasks that involve climbing or major physical exertion for 24 hours (because of the sedation medicines used during the test).  ° °SYMPTOMS TO REPORT IMMEDIATELY: °A gastroenterologist can be reached at any hour. Please call [phone]  for any of the following symptoms:  °Following lower endoscopy (colonoscopy, flexible sigmoidoscopy) °Excessive amounts of blood in the stool  °Significant tenderness, worsening of abdominal pains  °Swelling of the abdomen that is new, acute  °Fever of 100° or  higher  °Following upper endoscopy (EGD, EUS, ERCP, esophageal dilation) °Vomiting of blood or coffee ground material  °New, significant abdominal pain  °New, significant chest pain or pain under the shoulder blades  °Painful or persistently difficult swallowing  °New shortness of breath  °Black, tarry-looking or red, bloody stools ° °FOLLOW UP:  °If any biopsies were taken you will be contacted by phone or by letter within the next 1-3 weeks. Call [phone]  if you have not heard about the biopsies in 3 weeks.  °Please also call with any specific questions about appointments or follow up tests. ° °

## 2021-06-12 NOTE — Anesthesia Preprocedure Evaluation (Signed)
Anesthesia Evaluation  Patient identified by MRN, date of birth, ID band Patient awake    Reviewed: Allergy & Precautions, NPO status , Patient's Chart, lab work & pertinent test results  Airway Mallampati: II  TM Distance: >3 FB     Dental   Pulmonary asthma , sleep apnea and Continuous Positive Airway Pressure Ventilation , former smoker,    breath sounds clear to auscultation       Cardiovascular hypertension, Pt. on medications and Pt. on home beta blockers  Rhythm:Regular Rate:Normal     Neuro/Psych  Headaches,  Neuromuscular disease    GI/Hepatic Neg liver ROS, GERD  ,  Endo/Other  negative endocrine ROS  Renal/GU negative Renal ROS     Musculoskeletal   Abdominal   Peds  Hematology negative hematology ROS (+)   Anesthesia Other Findings   Reproductive/Obstetrics                             Anesthesia Physical Anesthesia Plan  ASA: 2  Anesthesia Plan: MAC   Post-op Pain Management:    Induction:   PONV Risk Score and Plan: 2 and Propofol infusion, Ondansetron and Treatment may vary due to age or medical condition  Airway Management Planned: Natural Airway, Nasal Cannula and Simple Face Mask  Additional Equipment:   Intra-op Plan:   Post-operative Plan:   Informed Consent: I have reviewed the patients History and Physical, chart, labs and discussed the procedure including the risks, benefits and alternatives for the proposed anesthesia with the patient or authorized representative who has indicated his/her understanding and acceptance.       Plan Discussed with:   Anesthesia Plan Comments:         Anesthesia Quick Evaluation

## 2021-06-12 NOTE — Anesthesia Postprocedure Evaluation (Signed)
Anesthesia Post Note  Patient: Kayla Sanders  Procedure(s) Performed: ESOPHAGOGASTRODUODENOSCOPY (EGD) WITH PROPOFOL COLONOSCOPY WITH PROPOFOL POLYPECTOMY HOT HEMOSTASIS (ARGON PLASMA COAGULATION/BICAP) BIOPSY     Patient location during evaluation: PACU Anesthesia Type: MAC Level of consciousness: awake and alert Pain management: pain level controlled Vital Signs Assessment: post-procedure vital signs reviewed and stable Respiratory status: spontaneous breathing, nonlabored ventilation, respiratory function stable and patient connected to nasal cannula oxygen Cardiovascular status: stable and blood pressure returned to baseline Postop Assessment: no apparent nausea or vomiting Anesthetic complications: no   No notable events documented.  Last Vitals:  Vitals:   06/12/21 1200 06/12/21 1210  BP: (!) 108/57 (!) 114/59  Pulse: 80 70  Resp: 16 18  Temp:    SpO2: 100% 99%    Last Pain:  Vitals:   06/12/21 1210  TempSrc:   PainSc: 0-No pain                 Tiajuana Amass

## 2021-06-12 NOTE — H&P (Signed)
[facility] History and Physical   Primary Care Physician:  Hayden Rasmussen, MD   Reason for Procedure:   Persistent GERD symptoms despite therapy and colorectal cancer screening  Plan:    EGD with +/- 48 hr pH Bravo placement and colonoscopy     HPI: Kayla Sanders is a 60 y.o. female with Persistent GERD symptoms despite therapy  here for EGD with +/- 48 hr pH Bravo placement and colonoscopy for colorectal cancer screening The risks and benefits as well as alternatives of endoscopic procedure(s) have been discussed and reviewed. All questions answered. The patient agrees to proceed.    Past Medical History:  Diagnosis Date   Anxiety    Anxiety    Asthma    Complication of anesthesia    told by anesthesia had small airway   Family history of adverse reaction to anesthesia    daughter has severe ponv   Fibromyalgia    GERD (gastroesophageal reflux disease)    Hyperlipidemia    Hypertension    Insomnia    Migraines    Mitral valve prolapse    OSA (obstructive sleep apnea)    on CPAP   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 02/03/2021   PONV (postoperative nausea and vomiting)    with general anesthesia severe ponv, does ok with propofol   Vitamin D deficiency     Past Surgical History:  Procedure Laterality Date   ABDOMINAL HYSTERECTOMY     partial   BRAVO PH STUDY N/A 02/06/2013   Procedure: BRAVO [place] STUDY;  Surgeon: Juanita Craver, MD;  Location: [facility];  Service: Endoscopy;  Laterality: N/A;   CARPAL TUNNEL RELEASE Right    CHOLECYSTECTOMY     COLONOSCOPY WITH PROPOFOL N/A 10/09/2016   Procedure: COLONOSCOPY WITH PROPOFOL;  Surgeon: Carol Ada, MD;  Location: [facility];  Service: Endoscopy;  Laterality: N/A;   colonscopy  2012   ESOPHAGOGASTRODUODENOSCOPY N/A 02/06/2013   Procedure: ESOPHAGOGASTRODUODENOSCOPY (EGD);  Surgeon: Juanita Craver, MD;  Location: [facility];  Service: Endoscopy;  Laterality: N/A;    ESOPHAGOGASTRODUODENOSCOPY (EGD) WITH PROPOFOL N/A 10/09/2016   Procedure: ESOPHAGOGASTRODUODENOSCOPY (EGD) WITH PROPOFOL;  Surgeon: Carol Ada, MD;  Location: [facility];  Service: Endoscopy;  Laterality: N/A;   HEMORRHOIDECTOMY WITH HEMORRHOID BANDING     NASAL SEPTUM SURGERY     RECTOCELE REPAIR     TMJ ARTHROPLASTY     and removal   TUBAL LIGATION     VESICOVAGINAL FISTULA CLOSURE W/ TAH      Prior to Admission medications   Medication Sig Start Date End Date Taking? Authorizing Provider  acetaminophen (TYLENOL) 500 MG tablet Take 500 mg by mouth at bedtime as needed for moderate pain.   Yes [provider]  albuterol (VENTOLIN HFA) 108 (90 Base) MCG/ACT inhaler Inhale 2 puffs into the lungs every 4 (four) hours as needed for shortness of breath. 05/20/21  Yes [provider]  ALPRAZolam (XANAX) 1 MG tablet Take 1 mg by mouth 2 (two) times daily as needed for anxiety.   Yes [provider]  buPROPion (WELLBUTRIN XL) 300 MG 24 hr tablet Take 300 mg by mouth every morning. 04/11/21  Yes [provider]  dexlansoprazole (DEXILANT) 60 MG capsule Take 60 mg by mouth daily.   Yes [provider]  famotidine (PEPCID) 40 MG tablet Take 40 mg by mouth daily.   Yes [provider]  fexofenadine (ALLEGRA) 180 MG tablet Take 180 mg by mouth every evening.  Yes [provider]  fluconazole (DIFLUCAN) 100 MG tablet Take 100 mg by mouth daily as needed (yeast). For yeast at caesarean scar. 09/24/16  Yes [provider]  hydrocortisone-pramoxine [facility]) 2.5-1 % rectal cream Place 1 application rectally daily as needed for hemorrhoids. 07/06/16  Yes [provider]  Ketotifen Fumarate (ALLERGY EYE DROPS OP) Place 1 drop into both eyes daily as needed (allergies).   Yes [provider]  lamoTRIgine (LAMICTAL) 200 MG tablet Take 200 mg by mouth daily.   Yes [provider]  levothyroxine (SYNTHROID) 25  MCG tablet Take 25 mcg by mouth daily.   Yes [provider]  metoprolol succinate (TOPROL XL) 25 MG 24 hr tablet Take 1 tablet (25 mg total) by mouth daily. 03/12/21  Yes Fay Records, MD  montelukast (SINGULAIR) 10 MG tablet Take 1 tablet (10 mg total) by mouth at bedtime. 09/05/13  Yes Rowe Clack, MD  nystatin cream (MYCOSTATIN) Apply 1 application topically 2 (two) times daily as needed (yeast). 05/02/21  Yes [provider]  nystatin-triamcinolone (MYCOLOG II) cream Apply 1 application topically 2 (two) times daily as needed (For cesarean incision (yeast at the site)). 08/24/16  Yes [provider]  olmesartan (BENICAR) 20 MG tablet Take 20 mg by mouth daily. 10/21/20  Yes [provider]  PRESCRIPTION MEDICATION Place 1 application vaginally 2 (two) times a week. Estradiol ellage 0.02%, 0.33m/1ml   Yes [provider]  rosuvastatin (CRESTOR) 5 MG tablet Take 1 tablet (5 mg total) by mouth daily. 01/31/21  Yes RFay Records MD  sucralfate (CARAFATE) 1 GM/10ML suspension Take 10 mLs by mouth 4 (four) times daily as needed (heartburn).   Yes [provider]  valACYclovir (VALTREX) 500 MG tablet Take 500 mg by mouth daily.   Yes [provider]  Vitamin D, Ergocalciferol, (DRISDOL) 1.25 MG (50000 UNIT) CAPS capsule Take 50,000 Units by mouth every Wednesday. 09/14/20  Yes [provider]  Na Sulfate-K Sulfate-Mg Sulf 17.5-3.13-1.6 GM/177ML SOLN 1 kit per colon directions  Disregard PLENVU RX pt wants SUPREP 04/10/21   NMauri Pole MD  PEG-KCl-NaCl-NaSulf-Na Asc-C (PLENVU) 140 g SOLR 1 kit use for colon as directed 04/10/21   Maille Halliwell, KVenia Minks MD  PREVALITE 4 g packet TAKE 1 PACKET (4 G TOTAL) BY MOUTH DAILY. BEFORE SUPPER AT 5 PM Patient not taking: No sig reported 05/05/21   NMauri Pole MD    No current facility-administered medications for this encounter.   Current Outpatient Medications   Medication Sig Dispense Refill   acetaminophen (TYLENOL) 500 MG tablet Take 500 mg by mouth at bedtime as needed for moderate pain.     albuterol (VENTOLIN HFA) 108 (90 Base) MCG/ACT inhaler Inhale 2 puffs into the lungs every 4 (four) hours as needed for shortness of breath.     ALPRAZolam (XANAX) 1 MG tablet Take 1 mg by mouth 2 (two) times daily as needed for anxiety.     buPROPion (WELLBUTRIN XL) 300 MG 24 hr tablet Take 300 mg by mouth every morning.     dexlansoprazole (DEXILANT) 60 MG capsule Take 60 mg by mouth daily.     famotidine (PEPCID) 40 MG tablet Take 40 mg by mouth daily.     fexofenadine (ALLEGRA) 180 MG tablet Take 180 mg by mouth every evening.     fluconazole (DIFLUCAN) 100 MG tablet Take 100 mg by mouth daily as needed (yeast). For yeast at caesarean scar.  hydrocortisone-pramoxine (ANALPRAM-HC) 2.5-1 % rectal cream Place 1 application rectally daily as needed for hemorrhoids.     Ketotifen Fumarate (ALLERGY EYE DROPS OP) Place 1 drop into both eyes daily as needed (allergies).     lamoTRIgine (LAMICTAL) 200 MG tablet Take 200 mg by mouth daily.     levothyroxine (SYNTHROID) 25 MCG tablet Take 25 mcg by mouth daily.     metoprolol succinate (TOPROL XL) 25 MG 24 hr tablet Take 1 tablet (25 mg total) by mouth daily. 90 tablet 1   montelukast (SINGULAIR) 10 MG tablet Take 1 tablet (10 mg total) by mouth at bedtime. 90 tablet 3   nystatin cream (MYCOSTATIN) Apply 1 application topically 2 (two) times daily as needed (yeast).     nystatin-triamcinolone (MYCOLOG II) cream Apply 1 application topically 2 (two) times daily as needed (For cesarean incision (yeast at the site)).     olmesartan (BENICAR) 20 MG tablet Take 20 mg by mouth daily.     PRESCRIPTION MEDICATION Place 1 application vaginally 2 (two) times a week. Estradiol ellage 0.02%, 0.37m/1ml     rosuvastatin (CRESTOR) 5 MG tablet Take 1 tablet (5 mg total) by mouth daily. 90 tablet 3   sucralfate (CARAFATE) 1 GM/10ML  suspension Take 10 mLs by mouth 4 (four) times daily as needed (heartburn).     valACYclovir (VALTREX) 500 MG tablet Take 500 mg by mouth daily.     Vitamin D, Ergocalciferol, (DRISDOL) 1.25 MG (50000 UNIT) CAPS capsule Take 50,000 Units by mouth every Wednesday.     Na Sulfate-K Sulfate-Mg Sulf 17.5-3.13-1.6 GM/177ML SOLN 1 kit per colon directions  Disregard PLENVU RX pt wants SUPREP 354 mL 0   PEG-KCl-NaCl-NaSulf-Na Asc-C (PLENVU) 140 g SOLR 1 kit use for colon as directed 1 each 0   PREVALITE 4 g packet TAKE 1 PACKET (4 G TOTAL) BY MOUTH DAILY. BEFORE SUPPER AT 5 PM (Patient not taking: No sig reported) 30 packet 1    Allergies as of 04/10/2021 - Review Complete 04/10/2021  Allergen Reaction Noted   Pseudoephedrine hcl Palpitations 11/11/2015   Ephedrine  11/02/2012   Fentanyl  10/01/2016    Family History  Problem Relation Age of Onset   Arthritis Mother    Hyperlipidemia Mother    Hypertension Mother    Sleep apnea Mother    Colon cancer Mother [id number]  Lung cancer Mother    Arthritis Father    Hyperlipidemia Father    Heart disease Father    Stroke Father    Hypertension Father    Sleep apnea Father    Arthritis Other    Breast cancer Other    Diabetes Other    Sleep apnea Brother    Pancreatic cancer Maternal Uncle    Colon polyps Brother    Atrial fibrillation Brother    Liver disease Neg Hx    Esophageal cancer Neg Hx    Stomach cancer Neg Hx    Rectal cancer Neg Hx     Social History   Socioeconomic History   Marital status: Married    Spouse name: Not on file   Number of children: 3   Years of education: BS   Highest education level: Not on file  Occupational History   Occupation: [employer]  Tobacco Use   Smoking status: Former    Types: Cigarettes    Quit date: 11/23/1981    Years since quitting: 39.5   Smokeless tobacco: Never  Vaping Use   Vaping Use: Never used  Substance and Sexual Activity   Alcohol use: Yes    Alcohol/week: 0.0 standard drinks     Comment: Rare   Drug use: No   Sexual activity: Not on file  Other Topics Concern   Not on file  Social History Narrative   Married with grown children. Works for [employer] as a Teacher, early years/pre. Nonsmoker, quit in 1983. Rare social Etoh.   Consumes about 3+ caffeine drinks a day    Social Determinants of Radio broadcast assistant Strain: Not on file  Food Insecurity: Not on file  Transportation Needs: Not on file  Physical Activity: Not on file  Stress: Not on file  Social Connections: Not on file  Intimate Partner Violence: Not on file    Review of Systems:  All other review of systems negative except as mentioned in the HPI.  Physical Exam: Vital signs in last 24 hours:     General:   Alert, NAD Lungs:  Clear .   Heart:  Regular rate and rhythm Abdomen:  Soft, nontender and nondistended. Neuro/Psych:  Alert and cooperative. Normal mood and affect. A and O x 3   K. Denzil Magnuson , MD [phone]

## 2021-06-13 LAB — SURGICAL PATHOLOGY

## 2021-06-16 ENCOUNTER — Other Ambulatory Visit: Payer: Self-pay | Admitting: *Deleted

## 2021-06-16 MED ORDER — SUCRALFATE 1 GM/10ML PO SUSP: 1.0000 g | Freq: Three times a day (TID) | ORAL | 1 refills | Status: DC

## 2021-06-17 ENCOUNTER — Encounter (HOSPITAL_COMMUNITY): Payer: Self-pay | Admitting: Gastroenterology

## 2021-06-25 DIAGNOSIS — J302 Other seasonal allergic rhinitis: Secondary | ICD-10-CM | POA: Insufficient documentation

## 2021-06-25 DIAGNOSIS — R490 Dysphonia: Secondary | ICD-10-CM | POA: Insufficient documentation

## 2021-06-25 DIAGNOSIS — R09A2 Foreign body sensation, throat: Secondary | ICD-10-CM | POA: Insufficient documentation

## 2021-06-30 ENCOUNTER — Other Ambulatory Visit: Payer: Self-pay

## 2021-07-01 ENCOUNTER — Telehealth: Payer: Self-pay

## 2021-07-01 NOTE — Telephone Encounter (Signed)
Called the patient back per her request via a patient advice request. No answer. Left her a message. She was inquiring about her pathology results from the biopsies and polyps taken during colonoscopy and EGD.  The doctor has not reviewed these yet. Questions about her medications.  I left a message asking she forward her questions via My Chart or call back and ask to speak with "Beth."

## 2021-07-25 ENCOUNTER — Other Ambulatory Visit: Payer: Self-pay

## 2021-07-25 ENCOUNTER — Telehealth: Payer: Self-pay | Admitting: Gastroenterology

## 2021-07-25 MED ORDER — SUCRALFATE 1 GM/10ML PO SUSP: 1.0000 g | Freq: Four times a day (QID) | ORAL | 3 refills | Status: DC

## 2021-07-25 NOTE — Telephone Encounter (Signed)
Spoke with the patient to advise the following; Manometry study 09/03/21  at 10:30 am Dr Silverio Decamp 09/22/21 at 8:30 am Referral to [facility] sent today Watch for instruction fo rmano studies to arrive through the mail.

## 2021-07-25 NOTE — Telephone Encounter (Signed)
Called patient and discussed results  Will send refill for 9 days for sucralfate suspension X 6 bottles  Please schedule office follow up visit after esophageal manometry and 24 hr pH impedance study on PPI  Please send referral to [facility] for Nissen fundoplication/antireflux surgery  She will need repeat EGD to document healing of esophageal ulcers and esophagitis, will schedule it after office visit  Recall colonoscopy in 3 years, h/o MUTYH gene mutation and mother had colon cancer ?unclear if she had Lynch syndrome

## 2021-08-01 ENCOUNTER — Other Ambulatory Visit: Payer: Self-pay | Admitting: Internal Medicine

## 2021-08-01 MED ORDER — SUCRALFATE 1 GM/10ML PO SUSP: 1.0000 g | Freq: Three times a day (TID) | ORAL | 3 refills | Status: DC

## 2021-08-13 ENCOUNTER — Telehealth: Payer: BC Managed Care – PPO | Admitting: Neurology

## 2021-08-13 NOTE — Telephone Encounter (Signed)
LMVM for pt to returned call.

## 2021-08-13 NOTE — Telephone Encounter (Signed)
Pt called stating that the pressure is to strong for her and she is having problems with her mask as well. She would like RN to call to discuss what other options can she try with the cpap. Please advise.

## 2021-08-13 NOTE — Telephone Encounter (Signed)
Pt returned call. Please call back when available. 

## 2021-08-14 NOTE — Telephone Encounter (Signed)
Spoke with the patient.  She stated her numbers are all over the place and she is not able to wear the CPAP as long.  The pressure is set at 15 but she states the AHI could be 2-3 or even 20. She feels it should be more consistent.  Pt wakes up sleepy and there are times when she wakes up and does not even remember having taken off her mask. She will bring the machine by the office today if she can so we can check her compliance report. She will be leaving this weekend and will be out of town for 2 weeks.

## 2021-08-25 ENCOUNTER — Encounter (HOSPITAL_COMMUNITY): Payer: Self-pay | Admitting: Gastroenterology

## 2021-09-03 ENCOUNTER — Telehealth: Payer: Self-pay | Admitting: Gastroenterology

## 2021-09-03 ENCOUNTER — Ambulatory Visit (HOSPITAL_COMMUNITY)
Admission: RE | Admit: 2021-09-03 | Payer: BC Managed Care – PPO | Source: Home / Self Care | Admitting: Gastroenterology

## 2021-09-03 ENCOUNTER — Other Ambulatory Visit: Payer: Self-pay

## 2021-09-03 DIAGNOSIS — K219 Gastro-esophageal reflux disease without esophagitis: Secondary | ICD-10-CM

## 2021-09-03 SURGERY — MANOMETRY, ESOPHAGUS
Anesthesia: Choice

## 2021-09-03 NOTE — Telephone Encounter (Signed)
Inbound call from patient requesting a call back to reschedule manometry procedure 10/12 due to her having symptoms of COVID.

## 2021-09-03 NOTE — Telephone Encounter (Signed)
Appt cancelled. EM rescheduled at [facility] 12/10/21@8 :30am. Pt aware of appt.

## 2021-09-03 NOTE — Progress Notes (Signed)
Pt called morning of mano stated that she was not feeling well this morning and wanted to cancel for today and would call office and reschedule.

## 2021-09-09 ENCOUNTER — Telehealth: Payer: Self-pay | Admitting: Gastroenterology

## 2021-09-09 NOTE — Telephone Encounter (Signed)
Will plan to check Gastrin level, please advise patient to continue PPI as she will have exacerbation of symptoms off PPI. She will need esophageal manometry and 24pH impedance on PPI and also follow up EGD next available appointment. Thanks

## 2021-09-09 NOTE — Telephone Encounter (Signed)
Inbound call from pt requesting a call back stating that she had to cxl her mano study and rescheduled for Jan, however she was trying to see if it was a cancellation list to get in sooner. Please advise. Thank you.

## 2021-09-09 NOTE — Telephone Encounter (Signed)
No sooner openings when I checked today. There is not a cancellation list with the manometry scheduling.  The patient reports her surgeon wants to do a gastrin level on her. The patient wants to know what you think, if you agree it is worth pursuing, do you recommend she fast and be off her medications? She wants to know before her appointment with you on 09/22/21 because if you agree it is worth pursing she would like to have those results when she comes in for follow up.

## 2021-09-10 ENCOUNTER — Other Ambulatory Visit: Payer: Self-pay | Admitting: Surgery

## 2021-09-10 DIAGNOSIS — K2971 Gastritis, unspecified, with bleeding: Secondary | ICD-10-CM

## 2021-09-10 DIAGNOSIS — K2211 Ulcer of esophagus with bleeding: Secondary | ICD-10-CM

## 2021-09-10 DIAGNOSIS — K269 Duodenal ulcer, unspecified as acute or chronic, without hemorrhage or perforation: Secondary | ICD-10-CM

## 2021-09-10 NOTE — Telephone Encounter (Signed)
Patient is aware of the plan. 

## 2021-09-11 ENCOUNTER — Other Ambulatory Visit: Payer: Self-pay | Admitting: Surgery

## 2021-09-11 ENCOUNTER — Other Ambulatory Visit: Payer: Self-pay

## 2021-09-11 DIAGNOSIS — K269 Duodenal ulcer, unspecified as acute or chronic, without hemorrhage or perforation: Secondary | ICD-10-CM

## 2021-09-11 DIAGNOSIS — K2211 Ulcer of esophagus with bleeding: Secondary | ICD-10-CM

## 2021-09-11 DIAGNOSIS — K2971 Gastritis, unspecified, with bleeding: Secondary | ICD-10-CM

## 2021-09-11 DIAGNOSIS — K219 Gastro-esophageal reflux disease without esophagitis: Secondary | ICD-10-CM

## 2021-09-22 ENCOUNTER — Ambulatory Visit: Payer: BC Managed Care – PPO | Admitting: Gastroenterology

## 2021-09-22 ENCOUNTER — Encounter: Payer: Self-pay | Admitting: Internal Medicine

## 2021-09-22 ENCOUNTER — Encounter: Payer: Self-pay | Admitting: Gastroenterology

## 2021-09-22 ENCOUNTER — Ambulatory Visit (INDEPENDENT_AMBULATORY_CARE_PROVIDER_SITE_OTHER): Payer: BC Managed Care – PPO | Admitting: Internal Medicine

## 2021-09-22 ENCOUNTER — Other Ambulatory Visit: Payer: Self-pay

## 2021-09-22 VITALS — BP 132/78 | HR 74 | Ht 60.5 in | Wt 161.8 lb

## 2021-09-22 VITALS — BP 118/70 | HR 72 | Ht 60.5 in | Wt 161.2 lb

## 2021-09-22 DIAGNOSIS — K2100 Gastro-esophageal reflux disease with esophagitis, without bleeding: Secondary | ICD-10-CM

## 2021-09-22 DIAGNOSIS — I2510 Atherosclerotic heart disease of native coronary artery without angina pectoris: Secondary | ICD-10-CM | POA: Diagnosis not present

## 2021-09-22 DIAGNOSIS — R1310 Dysphagia, unspecified: Secondary | ICD-10-CM | POA: Diagnosis not present

## 2021-09-22 NOTE — Patient Instructions (Addendum)
Keep Endoscopy and Esophageal Manometry as scheduled, we have no other hospital availabilities at this time to get you in any sooner. We will keep you in mind if someone cancels their procedure at the hospital  Continue Dexilant and carafate  Gastroesophageal Reflux Disease, Adult Gastroesophageal reflux (GER) happens when acid from the stomach flows up into the tube that connects the mouth and the stomach (esophagus). Normally, food travels down the esophagus and stays in the stomach to be digested. However, when a person has GER, food and stomach acid sometimes move back up into the esophagus. If this becomes a more serious problem, the person may be diagnosed with a disease called gastroesophageal reflux disease (GERD). GERD occurs when the reflux: Happens often. Causes frequent or severe symptoms. Causes problems such as damage to the esophagus. When stomach acid comes in contact with the esophagus, the acid may cause inflammation in the esophagus. Over time, GERD may create small holes (ulcers) in the lining of the esophagus. What are the causes? This condition is caused by a problem with the muscle between the esophagus and the stomach (lower esophageal sphincter, or LES). Normally, the LES muscle closes after food passes through the esophagus to the stomach. When the LES is weakened or abnormal, it does not close properly, and that allows food and stomach acid to go back up into the esophagus. The LES can be weakened by certain dietary substances, medicines, and medical conditions, including: Tobacco use. Pregnancy. Having a hiatal hernia. Alcohol use. Certain foods and beverages, such as coffee, chocolate, onions, and peppermint. What increases the risk? You are more likely to develop this condition if you: Have an increased body weight. Have a connective tissue disorder. Take NSAIDs, such as ibuprofen. What are the signs or symptoms? Symptoms of this condition  include: Heartburn. Difficult or painful swallowing and the feeling of having a lump in the throat. A bitter taste in the mouth. Bad breath and having a large amount of saliva. Having an upset or bloated stomach and belching. Chest pain. Different conditions can cause chest pain. Make sure you see your health care provider if you experience chest pain. Shortness of breath or wheezing. Ongoing (chronic) cough or a nighttime cough. Wearing away of tooth enamel. Weight loss. How is this diagnosed? This condition may be diagnosed based on a medical history and a physical exam. To determine if you have mild or severe GERD, your health care provider may also monitor how you respond to treatment. You may also have tests, including: A test to examine your stomach and esophagus with a small camera (endoscopy). A test that measures the acidity level in your esophagus. A test that measures how much pressure is on your esophagus. A barium swallow or modified barium swallow test to show the shape, size, and functioning of your esophagus. How is this treated? Treatment for this condition may vary depending on how severe your symptoms are. Your health care provider may recommend: Changes to your diet. Medicine. Surgery. The goal of treatment is to help relieve your symptoms and to prevent complications. Follow these instructions at home: Eating and drinking  Follow a diet as recommended by your health care provider. This may involve avoiding foods and drinks such as: Coffee and tea, with or without caffeine. Drinks that contain alcohol. Energy drinks and sports drinks. Carbonated drinks or sodas. Chocolate and cocoa. Peppermint and mint flavorings. Garlic and onions. Horseradish. Spicy and acidic foods, including peppers, chili powder, curry powder, vinegar, hot sauces,  and barbecue sauce. Citrus fruit juices and citrus fruits, such as oranges, lemons, and limes. Tomato-based foods, such as red  sauce, chili, salsa, and pizza with red sauce. Fried and fatty foods, such as donuts, french fries, potato chips, and high-fat dressings. High-fat meats, such as hot dogs and fatty cuts of red and white meats, such as rib eye steak, sausage, ham, and bacon. High-fat dairy items, such as whole milk, butter, and cream cheese. Eat small, frequent meals instead of large meals. Avoid drinking large amounts of liquid with your meals. Avoid eating meals during the 2-3 hours before bedtime. Avoid lying down right after you eat. Do not exercise right after you eat. Lifestyle  Do not use any products that contain nicotine or tobacco. These products include cigarettes, chewing tobacco, and vaping devices, such as e-cigarettes. If you need help quitting, ask your health care provider. Try to reduce your stress by using methods such as yoga or meditation. If you need help reducing stress, ask your health care provider. If you are overweight, reduce your weight to an amount that is healthy for you. Ask your health care provider for guidance about a safe weight loss goal. General instructions Pay attention to any changes in your symptoms. Take over-the-counter and prescription medicines only as told by your health care provider. Do not take aspirin, ibuprofen, or other NSAIDs unless your health care provider told you to take these medicines. Wear loose-fitting clothing. Do not wear anything tight around your waist that causes pressure on your abdomen. Raise (elevate) the head of your bed about 6 inches (15 cm). You can use a wedge to do this. Avoid bending over if this makes your symptoms worse. Keep all follow-up visits. This is important. Contact a health care provider if: You have: New symptoms. Unexplained weight loss. Difficulty swallowing or it hurts to swallow. Wheezing or a persistent cough. A hoarse voice. Your symptoms do not improve with treatment. Get help right away if: You have sudden  pain in your arms, neck, jaw, teeth, or back. You suddenly feel sweaty, dizzy, or light-headed. You have chest pain or shortness of breath. You vomit and the vomit is green, yellow, or black, or it looks like blood or coffee grounds. You faint. You have stool that is red, bloody, or black. You cannot swallow, drink, or eat. These symptoms may represent a serious problem that is an emergency. Do not wait to see if the symptoms will go away. Get medical help right away. Call your local emergency services (911 in the [place] Do not drive yourself to the hospital. Summary Gastroesophageal reflux happens when acid from the stomach flows up into the esophagus. GERD is a disease in which the reflux happens often, causes frequent or severe symptoms, or causes problems such as damage to the esophagus. Treatment for this condition may vary depending on how severe your symptoms are. Your health care provider may recommend diet and lifestyle changes, medicine, or surgery. Contact a health care provider if you have new or worsening symptoms. Take over-the-counter and prescription medicines only as told by your health care provider. Do not take aspirin, ibuprofen, or other NSAIDs unless your health care provider told you to do so. Keep all follow-up visits as told by your health care provider. This is important. This information is not intended to replace advice given to you by your health care provider. Make sure you discuss any questions you have with your health care provider. Document Revised: 05/20/2020 Document Reviewed: 05/20/2020  Elsevier Patient Education  2022 [employer].  I appreciate the  opportunity to care for you  Thank You   Harl Bowie , MD

## 2021-09-22 NOTE — Patient Instructions (Signed)
Medication Instructions:   Your physician recommends that you continue on your current medications as directed. Please refer to the Current Medication list given to you today.  *If you need a refill on your cardiac medications before your next appointment, please call your pharmacy*   Lab Work:  -NONE-  If you have labs (blood work) drawn today and your tests are completely normal, you will receive your results only by: [facility] (if you have MyChart) OR A paper copy in the mail If you have any lab test that is abnormal or we need to change your treatment, we will call you to review the results.   Testing/Procedures:  -NONE   Follow-Up: At [facility], you and your health needs are our priority.  As part of our continuing mission to provide you with exceptional heart care, we have created designated Provider Care Teams.  These Care Teams include your primary Cardiologist (physician) and Advanced Practice Providers (APPs -  Physician Assistants and Nurse Practitioners) who all work together to provide you with the care you need, when you need it.  We recommend signing up for the patient portal called "MyChart".  Sign up information is provided on this After Visit Summary.  MyChart is used to connect with patients for Virtual Visits (Telemedicine).  Patients are able to view lab/test results, encounter notes, upcoming appointments, etc.  Non-urgent messages can be sent to your provider as well.   To learn more about what you can do with MyChart, go to [email or web address].    Your next appointment:   1 year(s)  The format for your next appointment:   In Person  Provider:   Dorris Carnes, MD   Other Instructions  Your physician wants you to follow-up in: 1 year with Dr.Ross.  You will receive a reminder letter in the mail two months in advance. If you don't receive a letter, please call our office to schedule the follow-up appointment.

## 2021-09-22 NOTE — Progress Notes (Signed)
Kayla Sanders    [id number]    07/05/1961  Primary Care Physician:Richter, Maebelle Munroe, MD  Referring Physician: Hayden Rasmussen, MD [address],  [place] [address]   Chief complaint: GERD  HPI:  60 year old very pleasant female here for follow-up for GERD  She is experiencing persistent GERD symptoms despite taking Dexilant daily and Pepcid at bedtime.  She is using Carafate.  She also has difficulty swallowing especially worse with pills Denies any food impactions  EGD 06/12/2021: Grade C erosive esophagitis with superficial esophageal ulcers.  Gastroduodenitis.  Biopsies negative for H. pylori.  Colonoscopy June 12, 2021: Colonic AVMs s/p APC, benign tubular adenomas removed, internal hemorrhoids  She saw Dr. Kae Heller at [facility] surgery.  She had gastrin level checked which was mildly elevated on PPI but not concerning for hypergastrinemia  Relevant history: She was previously followed by Dr. Collene Mares is here to establish care She has family history of colon cancer in her mother, resected colon cancer specimen was positive for Lynch syndrome mutation.  Patient underwent genetic testing, was not positive for Lynch syndrome but had MUTYH gene mutation for familial adenomatous polyposis syndrome and recommendation was for patient to have colonoscopy every 5 years    She is s/p cholecystectomy and since she had her gallbladder removed she is having increased fecal urgency and semiformed to liquid stool, worse in the morning after breakfast   She has obstructive sleep apnea and was told she has difficult airway by anesthesia in the past   EGD October 09, 2016: By Dr. Benson Norway Per report was normal exam.  Esophagus was attempted to dilate with 18 mm Savary which was unsuccessful subsequently used 74mm Savary with mild resistance Colonoscopy October 09, 2016 by Dr. Benson Norway - Two 2 to 3 mm polyps [tubular adenomas] in the ascending colon, removed with a  cold snare. Resected and retrieved.   Outpatient Encounter Medications as of 09/22/2021  Medication Sig   acetaminophen (TYLENOL) 500 MG tablet Take 500 mg by mouth at bedtime as needed for moderate pain.   albuterol (VENTOLIN HFA) 108 (90 Base) MCG/ACT inhaler Inhale 2 puffs into the lungs every 4 (four) hours as needed for shortness of breath.   ALPRAZolam (XANAX) 1 MG tablet Take 1 mg by mouth 2 (two) times daily as needed for anxiety.   buPROPion (WELLBUTRIN XL) 300 MG 24 hr tablet Take 300 mg by mouth every morning.   dexlansoprazole (DEXILANT) 60 MG capsule Take 60 mg by mouth daily.   famotidine (PEPCID) 40 MG tablet Take 40 mg by mouth daily.   fexofenadine (ALLEGRA) 180 MG tablet Take 180 mg by mouth every evening.   fluconazole (DIFLUCAN) 100 MG tablet Take 100 mg by mouth daily as needed (yeast). For yeast at caesarean scar.   hydrocortisone-pramoxine (ANALPRAM-HC) 2.5-1 % rectal cream Place 1 application rectally daily as needed for hemorrhoids.   Ketotifen Fumarate (ALLERGY EYE DROPS OP) Place 1 drop into both eyes daily as needed (allergies).   lamoTRIgine (LAMICTAL) 200 MG tablet Take 200 mg by mouth daily.   levothyroxine (SYNTHROID) 25 MCG tablet Take 25 mcg by mouth daily.   metoprolol succinate (TOPROL-XL) 25 MG 24 hr tablet TAKE 1 TABLET (25 MG TOTAL) BY MOUTH DAILY.   montelukast (SINGULAIR) 10 MG tablet Take 1 tablet (10 mg total) by mouth at bedtime.   nystatin cream (MYCOSTATIN) Apply 1 application topically 2 (two) times daily as  needed (yeast).   nystatin-triamcinolone (MYCOLOG II) cream Apply 1 application topically 2 (two) times daily as needed (For cesarean incision (yeast at the site)).   olmesartan (BENICAR) 20 MG tablet Take 20 mg by mouth daily.   PRESCRIPTION MEDICATION Place 1 application vaginally 2 (two) times a week. Estradiol ellage 0.02%, 0.2mg /48ml   rosuvastatin (CRESTOR) 5 MG tablet Take 1 tablet (5 mg total) by mouth daily.   sucralfate (CARAFATE) 1  GM/10ML suspension Take 10 mLs (1 g total) by mouth 4 (four) times daily.   valACYclovir (VALTREX) 500 MG tablet Take 500 mg by mouth daily.   Vitamin D, Ergocalciferol, (DRISDOL) 1.25 MG (50000 UNIT) CAPS capsule Take 50,000 Units by mouth every Wednesday.   PREVALITE 4 g packet TAKE 1 PACKET (4 G TOTAL) BY MOUTH DAILY. BEFORE SUPPER AT 5 PM (Patient not taking: Reported on 09/22/2021)   [DISCONTINUED] sucralfate (CARAFATE) 1 GM/10ML suspension Take 10 mLs (1 g total) by mouth 4 (four) times daily -  with meals and at bedtime.   No facility-administered encounter medications on file as of 09/22/2021.    Allergies as of 09/22/2021 - Review Complete 09/22/2021  Allergen Reaction Noted   Pseudoephedrine hcl Palpitations 11/11/2015   Fentanyl  10/01/2016   Levofloxacin  08/07/2021   Ephedrine Palpitations 11/02/2012    Past Medical History:  Diagnosis Date   Anxiety    Anxiety    Asthma    Complication of anesthesia    told by anesthesia had small airway   Family history of adverse reaction to anesthesia    daughter has severe ponv   Fibromyalgia    GERD (gastroesophageal reflux disease)    Hyperlipidemia    Hypertension    Insomnia    Migraines    Mitral valve prolapse    OSA (obstructive sleep apnea)    on CPAP   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 02/03/2021   PONV (postoperative nausea and vomiting)    with general anesthesia severe ponv, does ok with propofol   Vitamin D deficiency     Past Surgical History:  Procedure Laterality Date   ABDOMINAL HYSTERECTOMY     partial   BIOPSY  06/12/2021   Procedure: BIOPSY;  Surgeon: Mauri Pole, MD;  Location: [facility];  Service: Endoscopy;;   BRAVO [place] STUDY N/A 02/06/2013   Procedure: BRAVO [place];  Surgeon: Juanita Craver, MD;  Location: [facility];  Service: Endoscopy;  Laterality: N/A;   CARPAL TUNNEL RELEASE Right    CHOLECYSTECTOMY     COLONOSCOPY WITH PROPOFOL N/A 10/09/2016    Procedure: COLONOSCOPY WITH PROPOFOL;  Surgeon: Carol Ada, MD;  [facility]: [facility];  Service: Endoscopy;  Laterality: N/A;   COLONOSCOPY WITH PROPOFOL N/A 06/12/2021   Procedure: COLONOSCOPY WITH PROPOFOL;  Surgeon: Mauri Pole, MD;  Location: [facility];  Service: Endoscopy;  Laterality: N/A;   colonscopy  2012   ESOPHAGOGASTRODUODENOSCOPY N/A 02/06/2013   Procedure: ESOPHAGOGASTRODUODENOSCOPY (EGD);  Surgeon: Juanita Craver, MD;  [facility]: [facility];  Service: Endoscopy;  Laterality: N/A;   ESOPHAGOGASTRODUODENOSCOPY (EGD) WITH PROPOFOL N/A 10/09/2016   Procedure: ESOPHAGOGASTRODUODENOSCOPY (EGD) WITH PROPOFOL;  Surgeon: Carol Ada, MD;  [facility]: [facility];  Service: Endoscopy;  Laterality: N/A;   ESOPHAGOGASTRODUODENOSCOPY (EGD) WITH PROPOFOL N/A 06/12/2021   Procedure: ESOPHAGOGASTRODUODENOSCOPY (EGD) WITH PROPOFOL;  Surgeon: Mauri Pole, MD;  [facility]: [facility];  Service: Endoscopy;  Laterality: N/A;   HEMORRHOIDECTOMY WITH HEMORRHOID BANDING     HOT HEMOSTASIS N/A 06/12/2021  Procedure: HOT HEMOSTASIS (ARGON PLASMA COAGULATION/BICAP);  Surgeon: Mauri Pole, MD;  [facility];  Service: Endoscopy;  Laterality: N/A;   NASAL SEPTUM SURGERY     POLYPECTOMY  06/12/2021   Procedure: POLYPECTOMY;  Surgeon: Mauri Pole, MD;  Location: [facility];  Service: Endoscopy;;   RECTOCELE REPAIR     TMJ ARTHROPLASTY     and removal   TUBAL LIGATION     VESICOVAGINAL FISTULA CLOSURE W/ TAH      Family History  Problem Relation Age of Onset   Arthritis Mother    Hyperlipidemia Mother    Hypertension Mother    Sleep apnea Mother    Colon cancer Mother 13   Lung cancer Mother    Arthritis Father    Hyperlipidemia Father    Heart disease Father    Stroke Father    Hypertension Father    Sleep apnea Father    Arthritis Other    Breast cancer Other    Diabetes Other    Sleep apnea Brother    Pancreatic cancer Maternal Uncle    Colon  polyps Brother    Atrial fibrillation Brother    Liver disease Neg Hx    Esophageal cancer Neg Hx    Stomach cancer Neg Hx    Rectal cancer Neg Hx     Social History   Socioeconomic History   Marital status: Married    Spouse name: Not on file   Number of children: 3   Years of education: BS   Highest education level: Not on file  Occupational History   Occupation: [employer]  Tobacco Use   Smoking status: Former    Types: Cigarettes    Quit date: 11/23/1981    Years since quitting: 39.8   Smokeless tobacco: Never  Vaping Use   Vaping Use: Never used  Substance and Sexual Activity   Alcohol use: Yes    Alcohol/week: 0.0 standard drinks    Comment: Rare   Drug use: No   Sexual activity: Not on file  Other Topics Concern   Not on file  Social History Narrative   Married with grown children. Works for [employer] as a Teacher, early years/pre. Nonsmoker, quit in 1983. Rare social Etoh.   Consumes about 3+ caffeine drinks a day    Social Determinants of Radio broadcast assistant Strain: Not on file  Food Insecurity: Not on file  Transportation Needs: Not on file  Physical Activity: Not on file  Stress: Not on file  Social Connections: Not on file  Intimate Partner Violence: Not on file      Review of systems: All other review of systems negative except as mentioned in the HPI.   Physical Exam: Vitals:   09/22/21 0828  BP: 118/70  Pulse: 72   Body mass index is 30.97 kg/m. Gen:      No acute distress HEENT:  sclera anicteric Abd:      soft, non-tender; no palpable masses, no distension Ext:    No edema Neuro: alert and oriented x 3 Psych: normal mood and affect  Data Reviewed:  Reviewed labs, radiology imaging, old records and pertinent past GI work up   Assessment and Plan/Recommendations:  60 year old very pleasant female with complaints of persistent GERD symptoms despite high-dose PPI and Carafate She had findings of severe erosive esophagitis, is  scheduled for follow-up EGD to document healing, repeat esophageal biopsies if needed and esophageal dilation for dysphagia The risks and benefits as well  as alternatives of endoscopic procedure(s) have been discussed and reviewed. All questions answered. The patient agrees to proceed.   She is scheduled for esophageal manometry and 24-hour pH impedance study on PPI to exclude any significant underlying esophageal dysmotility  She will follow-up with Dr. Windle Guard after above work-up is complete to discuss possible Nissen fundoplication for uncontrolled GERD   The patient was provided an opportunity to ask questions and all were answered. The patient agreed with the plan and demonstrated an understanding of the instructions.  Damaris Hippo , MD    CC: Hayden Rasmussen, MD

## 2021-09-22 NOTE — Progress Notes (Signed)
Cardiology Office Note   Date:  09/22/2021   ID:  Kayla Sanders, DOB 02-Dec-1960, MRN [id number]  PCP:  Hayden Rasmussen, MD  Cardiologist:   Dorris Carnes, MD   Pt presents to   Kayla Sanders seen by Ezzie Dural in 2013  for CP   AlBurping /belching  Also hx of GERD and mild CV dz   Stres test done    I saw her back in early 2022   Continued pressure Squeezing senssation.   Also palpitations    Was under increased stress CT coronary angio done  Ca score wsa 3.36  Minimal plaquing of LAD   Atypical pulm v with 4 on L aand 2 on R.    Since I saw her in Feb she has done well from a cardiac standpoint   But she says that CPAP is not going good  About to be seen for this  She denies CP  No dizziness  No palpitatoins   Current Meds  Medication Sig   acetaminophen (TYLENOL) 500 MG tablet Take 500 mg by mouth at bedtime as needed for moderate pain.   albuterol (VENTOLIN HFA) 108 (90 Base) MCG/ACT inhaler Inhale 2 puffs into the lungs every 4 (four) hours as needed for shortness of breath.   ALPRAZolam (XANAX) 1 MG tablet Take 1 mg by mouth 2 (two) times daily as needed for anxiety.   buPROPion (WELLBUTRIN XL) 300 MG 24 hr tablet Take 300 mg by mouth every morning.   dexlansoprazole (DEXILANT) 60 MG capsule Take 60 mg by mouth daily.   famotidine (PEPCID) 40 MG tablet Take 40 mg by mouth daily.   fexofenadine (ALLEGRA) 180 MG tablet Take 180 mg by mouth every evening.   fluconazole (DIFLUCAN) 100 MG tablet Take 100 mg by mouth daily as needed (yeast). For yeast at caesarean scar.   hydrocortisone-pramoxine (ANALPRAM-HC) 2.5-1 % rectal cream Place 1 application rectally daily as needed for hemorrhoids.   Ketotifen Fumarate (ALLERGY EYE DROPS OP) Place 1 drop into both eyes daily as needed (allergies).   lamoTRIgine (LAMICTAL) 200 MG tablet Take 200 mg by mouth daily.   levothyroxine (SYNTHROID) 25 MCG tablet Take 25 mcg by mouth daily.   metoprolol succinate (TOPROL-XL) 25 MG 24 hr tablet TAKE 1  TABLET (25 MG TOTAL) BY MOUTH DAILY.   montelukast (SINGULAIR) 10 MG tablet Take 1 tablet (10 mg total) by mouth at bedtime.   nystatin cream (MYCOSTATIN) Apply 1 application topically 2 (two) times daily as needed (yeast).   olmesartan (BENICAR) 20 MG tablet Take 20 mg by mouth daily.   olopatadine (PATANOL) 0.1 % ophthalmic solution Place 1 drop into both eyes in the morning and at bedtime.   PRESCRIPTION MEDICATION Place 1 application vaginally 2 (two) times a week. Estradiol ellage 0.02%, 0.2mg /29ml   PREVALITE 4 g packet TAKE 1 PACKET (4 G TOTAL) BY MOUTH DAILY. BEFORE SUPPER AT 5 PM   rosuvastatin (CRESTOR) 5 MG tablet Take 1 tablet (5 mg total) by mouth daily.   sucralfate (CARAFATE) 1 GM/10ML suspension Take 10 mLs (1 g total) by mouth 4 (four) times daily.   valACYclovir (VALTREX) 500 MG tablet Take 500 mg by mouth daily.   Vitamin D, Ergocalciferol, (DRISDOL) 1.25 MG (50000 UNIT) CAPS capsule Take 50,000 Units by mouth every Wednesday.     Allergies:   Pseudoephedrine hcl, Fentanyl, Levofloxacin, and Ephedrine   Past Medical History:  Diagnosis Date   Anxiety    Anxiety  Asthma    Complication of anesthesia    told by anesthesia had small airway   Family history of adverse reaction to anesthesia    daughter has severe ponv   Fibromyalgia    GERD (gastroesophageal reflux disease)    Hyperlipidemia    Hypertension    Insomnia    Migraines    Mitral valve prolapse    OSA (obstructive sleep apnea)    on CPAP   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 02/03/2021   PONV (postoperative nausea and vomiting)    with general anesthesia severe ponv, does ok with propofol   Vitamin D deficiency     Past Surgical History:  Procedure Laterality Date   ABDOMINAL HYSTERECTOMY     partial   BIOPSY  06/12/2021   Procedure: BIOPSY;  Surgeon: Mauri Pole, MD;  [facility];  Service: Endoscopy;;   BRAVO [place] STUDY N/A 02/06/2013   Procedure:  BRAVO [place];  Surgeon: Juanita Craver, MD;  [facility];  Service: Endoscopy;  Laterality: N/A;   CARPAL TUNNEL RELEASE Right    CHOLECYSTECTOMY     COLONOSCOPY WITH PROPOFOL N/A 10/09/2016   Procedure: COLONOSCOPY WITH PROPOFOL;  Surgeon: Carol Ada, MD;  [facility];  Service: Endoscopy;  Laterality: N/A;   COLONOSCOPY WITH PROPOFOL N/A 06/12/2021   Procedure: COLONOSCOPY WITH PROPOFOL;  Surgeon: Mauri Pole, MD;  [facility];  Service: Endoscopy;  Laterality: N/A;   colonscopy  2012   ESOPHAGOGASTRODUODENOSCOPY N/A 02/06/2013   Procedure: ESOPHAGOGASTRODUODENOSCOPY (EGD);  Surgeon: Juanita Craver, MD;  [facility];  Service: Endoscopy;  Laterality: N/A;   ESOPHAGOGASTRODUODENOSCOPY (EGD) WITH PROPOFOL N/A 10/09/2016   Procedure: ESOPHAGOGASTRODUODENOSCOPY (EGD) WITH PROPOFOL;  Surgeon: Carol Ada, MD;  [facility];  Service: Endoscopy;  Laterality: N/A;   ESOPHAGOGASTRODUODENOSCOPY (EGD) WITH PROPOFOL N/A 06/12/2021   Procedure: ESOPHAGOGASTRODUODENOSCOPY (EGD) WITH PROPOFOL;  Surgeon: Mauri Pole, MD;  [facility];  Service: Endoscopy;  Laterality: N/A;   HEMORRHOIDECTOMY WITH HEMORRHOID BANDING     HOT HEMOSTASIS N/A 06/12/2021   Procedure: HOT HEMOSTASIS (ARGON PLASMA COAGULATION/BICAP);  Surgeon: Mauri Pole, MD;  [facility];  Service: Endoscopy;  Laterality: N/A;   NASAL SEPTUM SURGERY     POLYPECTOMY  06/12/2021   Procedure: POLYPECTOMY;  Surgeon: Mauri Pole, MD;  [facility];  Service: Endoscopy;;   RECTOCELE REPAIR     TMJ ARTHROPLASTY     and removal   TUBAL LIGATION     VESICOVAGINAL FISTULA CLOSURE W/ TAH       Social History:  The patient  reports that she quit smoking about 39 years ago. Her smoking use included cigarettes. She has never used smokeless tobacco. She reports current alcohol use. She reports that she does not use drugs.   Family History:   The patient's family history includes Arthritis in her father, mother, and another family member; Atrial fibrillation in her brother; Breast cancer in an other family member; Colon cancer (age of onset: 35) in her mother; Colon polyps in her brother; Diabetes in an other family member; Heart disease in her father; Hyperlipidemia in her father and mother; Hypertension in her father and mother; Lung cancer in her mother; Pancreatic cancer in her maternal uncle; Sleep apnea in her brother, father, and mother; Stroke in her father.    ROS:  Please see the history of present illness. All other systems are reviewed and  Negative to the above problem except  as noted.    PHYSICAL EXAM: VS:  BP 132/78   Pulse 74   Ht 5' 0.5" (1.537 m)   Wt 161 lb 12.8 oz (73.4 kg)   SpO2 96%   BMI 31.08 kg/m   GEN: OBese 60  yo , in no acute distress  HEENT: normal  Neck: no JVD, carotid bruits,  Cardiac: RRR; no murmurs, No LE edema  Respiratory:  clear to auscultation bilaterally, GI: soft, nontender, nondistended, + BS  No hepatomegaly  MS: no deformity Moving all extremities   Skin: warm and dry, no rash Neuro:  Strength and sensation are intact Psych: euthymic mood, full affect   EKG:  EKG is ordered today.SR 91 bpm   Lipid Panel    Component Value Date/Time   CHOL 153 04/15/2021 0833   TRIG 81 04/15/2021 0833   HDL 63 04/15/2021 0833   CHOLHDL 2.4 04/15/2021 0833   CHOLHDL 3 09/05/2013 1107   VLDL 20.0 09/05/2013 1107   LDLCALC 75 04/15/2021 0833      Wt Readings from Last 3 Encounters:  09/22/21 161 lb 12.8 oz (73.4 kg)  09/22/21 161 lb 4 oz (73.1 kg)  06/12/21 167 lb (75.8 kg)      ASSESSMENT AND PLAN:  1  Hx of CP   Denies   2  CAD  Mild  Continue to follow     3  Palpitations   Patient wore a monitor in April  PACs and PVCs  Doing better on b blocker   Continue to follow   4 HTN  BP is controlled  5  OSA  Encouraged her to f/u with CPAP      Current medicines are  reviewed at length with the patient today.  The patient does not have concerns regarding medicines.  Signed, Dorris Carnes, MD  09/22/2021 2:44 PM    [address], [place], [place]  [address] Phone: [phone]; Fax: [phone]

## 2021-09-25 ENCOUNTER — Encounter: Payer: Self-pay | Admitting: Gastroenterology

## 2021-09-26 ENCOUNTER — Ambulatory Visit
Admission: RE | Admit: 2021-09-26 | Discharge: 2021-09-26 | Disposition: A | Discharge status: Home / Self Care | Payer: Self-pay | Source: Ambulatory Visit | Attending: Surgery | Admitting: Surgery

## 2021-09-26 DIAGNOSIS — K2211 Ulcer of esophagus with bleeding: Secondary | ICD-10-CM

## 2021-09-26 DIAGNOSIS — K269 Duodenal ulcer, unspecified as acute or chronic, without hemorrhage or perforation: Secondary | ICD-10-CM

## 2021-09-26 DIAGNOSIS — K2971 Gastritis, unspecified, with bleeding: Secondary | ICD-10-CM

## 2021-10-08 ENCOUNTER — Encounter: Payer: Self-pay | Admitting: Neurology

## 2021-10-22 ENCOUNTER — Encounter: Payer: Self-pay | Admitting: Gastroenterology

## 2021-10-22 ENCOUNTER — Encounter: Payer: Self-pay | Admitting: Neurology

## 2021-10-22 NOTE — Telephone Encounter (Signed)
Sent secure message to [employer] requesting an update/clarification as this order was placed in July 2022.

## 2021-10-24 ENCOUNTER — Other Ambulatory Visit: Payer: Self-pay | Admitting: *Deleted

## 2021-10-24 DIAGNOSIS — G4733 Obstructive sleep apnea (adult) (pediatric): Secondary | ICD-10-CM

## 2021-10-27 ENCOUNTER — Ambulatory Visit: Payer: BC Managed Care – PPO | Admitting: Neurology

## 2021-10-27 ENCOUNTER — Telehealth: Payer: Self-pay | Admitting: Neurology

## 2021-10-27 NOTE — Telephone Encounter (Signed)
LVM for patient that her appointment for today was rescheduled to 12/23/21 because Dr. Rexene Alberts is out. I have her on the wait list.

## 2021-10-27 NOTE — Telephone Encounter (Signed)
Kayla Razor, RN Hey just a follow up on this. Patient is getting new insurance on 01/01. I talked to her and we are going to wait to process the order then.

## 2021-11-03 ENCOUNTER — Encounter: Payer: Self-pay | Admitting: Gastroenterology

## 2021-11-11 ENCOUNTER — Ambulatory Visit: Payer: BC Managed Care – PPO | Admitting: Neurology

## 2021-11-18 ENCOUNTER — Ambulatory Visit (HOSPITAL_COMMUNITY): Admit: 2021-11-18 | Payer: BC Managed Care – PPO | Admitting: Gastroenterology

## 2021-11-18 ENCOUNTER — Encounter (HOSPITAL_COMMUNITY): Payer: Self-pay

## 2021-11-18 SURGERY — ESOPHAGOGASTRODUODENOSCOPY (EGD) WITH PROPOFOL
Anesthesia: Monitor Anesthesia Care

## 2021-11-27 ENCOUNTER — Other Ambulatory Visit: Payer: Self-pay

## 2021-11-27 ENCOUNTER — Ambulatory Visit (INDEPENDENT_AMBULATORY_CARE_PROVIDER_SITE_OTHER): Payer: 59 | Admitting: Neurology

## 2021-11-27 ENCOUNTER — Encounter: Payer: Self-pay | Admitting: Neurology

## 2021-11-27 VITALS — BP 135/77 | HR 73 | Ht 61.0 in | Wt 151.0 lb

## 2021-11-27 DIAGNOSIS — Z9989 Dependence on other enabling machines and devices: Secondary | ICD-10-CM

## 2021-11-27 DIAGNOSIS — G43109 Migraine with aura, not intractable, without status migrainosus: Secondary | ICD-10-CM

## 2021-11-27 DIAGNOSIS — G4484 Primary exertional headache: Secondary | ICD-10-CM

## 2021-11-27 DIAGNOSIS — G4733 Obstructive sleep apnea (adult) (pediatric): Secondary | ICD-10-CM

## 2021-11-27 NOTE — Patient Instructions (Signed)
It was nice to see you again today.  I am sorry to hear that you have had recent migraines, you have a lot going on at this time and migraines can be triggered by stress, sleep deprivation, dehydration and you probably had a combination of factors.  You also have prior history of migraines which makes you prone to having migraines.  We will work on getting you a new CPAP machine and then work on fine-tuning depression.  For now, please continue with your old CPAP machine.  Please use Tylenol as needed as you cannot take nonsteroidal anti-inflammatory medications due to your sensitive stomach.  I would like to go ahead with a brain MRI and an MR angiogram of your head.  We will call you to schedule these, these tests do require insurance authorization.  We will call him with the results.  Once you have your new CPAP machine, we will need to see you in follow-up in about 1 to 3 months after starting treatment with a new machine.  Please call our office to make that appointment as soon as you have more information about when you get your new machine.

## 2021-11-27 NOTE — Telephone Encounter (Signed)
Patient is here for an appointment today for migraines but also discussed CPAP.  Waiting on new machine. She may have forgotten to call [employer] with her new insurance. Her new insurance card has been scanned into the chart.  I sent a message to [facility] w/ [employer] to let her know and requested new machine order process ASAP. Pt aware and appreciative.

## 2021-11-27 NOTE — Progress Notes (Signed)
Subjective:    Patient ID: Kayla Sanders is a 61 y.o. female.  HPI    Interim history:   Kayla Sanders is a 61 year old right-handed woman with an underlying medical history of vitamin D deficiency, hypertension, hyperlipidemia, reflux disease, mitral valve prolapse, migraine headaches, anxiety, asthma, neck pain, chronic sinusitis, mood disorder, low back pain, and mild obesity, who presents for a new problem visit of headaches with sexual activity.  The patient is referred by her primary care physician for this.  I had evaluated her last year for her sleep apnea diagnosis.  She had a subsequent split-night sleep study on 01/23/2021 which confirmed moderate obstructive sleep apnea with good results with CPAP of 14 cm.  She was eligible for a new machine but did not get a new machine at the time.  Her previous CPAP was over 16 years old and she was on CPAP of 12 cm at the time.  I reviewed Dr. Dennie Fetters office records, but they were in part difficult to read due to a poor copy quality.   Today, 11/27/2021: She has a history of migraine headaches, but reports that this was years ago and they improved as she went through menopause.  Some 5 years ago she had 1 episode of ocular migraine with a significant visual aura and a milder headache.  She has never really been on any prescription medication for migraines.  She had an episode of severe migraine after sexual activity some 6 months ago and then again about 2 months ago, overall she had only these 2 events.  She had no strokelike symptoms such as one-sided weakness or numbness or tingling or droopy face or slurring of speech.  She has experienced other new symptoms in the interim including balance issues, feeling foggy headed, concern for Ehlers-Danlos syndrome as 1 daughter has been diagnosed with this condition and another daughter has symptoms similar to EDS. patient primary care within the last 2 months.  She has been using her old CPAP machine.   She has not quite started with a new machine.  I was able to review her CPAP compliance data from 10/28/2021 through 11/26/2021, which is a total of 30 days, during which time she used her machine every night except for 1 night, percent use days greater than 4 hours at 87%, indicating very good compliance, average usage of 5 hours and 30 minutes, residual AHI elevated at 11.1/h without breakdown whether she has more obstructive or central events on this report.  Leak acceptable with a 95th percentile at 7.7 L/min on a pressure of 15 cm with EPR of 2.  She reports that she does not always sleep well.  Sometimes she pulls off the mask in the middle of the night and the pressure does seem high for her.  She is willing to try to get an updated machine which may help [place] get a more detailed breakdown on her residual events and help fine-tune her treatment.  She has an updated eye examination yearly.  Her last eye examination was less than a year ago.    Previously:   01/13/21: (She) was previously diagnosed with obstructive sleep apnea.  I had evaluated her in 2016 for her sleep disorder.  She was advised to proceed with a sleep study.  She did not have a sleep test through our office at the time. She had a subsequent split-night sleep study through [facility] on 08/1916 and the study was interpreted by Dr. Baird Lyons.  AHI at baseline was 17.1/h, O2 nadir 88%.  She was titrated on CPAP with an optimal pressure of 12 cm.  She had a consultation with ENT, Dr. Wilburn Cornelia in December 2017 and surgical intervention was not recommended at the time.    She has been using the [place] view full facemask from [employer].  She does have to tighten it quite a bit.  Last year she had to buy supplies online.  She has changed DME companies over time, first she was with [facility], then [facility], now with [employer] but has not received any supplies from [place] yet.  She has had significant stressors lately. Sadly, she  lost her son about a week ago.  Her daughters who live in [place] are currently staying with her.  Patient is having a difficult time.  She also lost her mom last year in June.  She was mom's caretaker, she had end-stage lung cancer which was diagnosed in February 2021, Mom lived in [place] and patient stayed with her between February and June 2021 until her passing.   She has trouble sleeping currently which is understandable due to her stress.  She does not have a very set schedule right now.  She is trying to use her machine consistently and I was able to review her compliance data from 12/15/2020 through 01/12/2021 which is a total of 29 days, during which time she used her machine every night with an average usage of 6 hours and 20 minutes, residual AHI elevated at 12.8, breakdown of the events is not available, not sure if her residual events are mostly or exclusively obstructive versus central's.  CPAP pressure of 12 cm with EPR of 2.  She has a mild leak, 95th percentile is 2.4 L/min.  She reports no night to night nocturia but has woken up with a headache at times.  Epworth sleepiness score is 12 out of 24, fatigue severity score is 15 out of 63.    11/11/15: 61 year old right-handed woman with an underlying medical history of asthma, vitamin D deficiency, seasonal allergies, hyperlipidemia, hypertension, mitral valve prolapse, reflux disease, migraines, anxiety, obesity and fibromyalgia, who reports snoring and excessive daytime somnolence.  I reviewed your office note from 10/08/2015, which you kindly included. Of note, she had a previous sleep study at [facility] on 12/30/2001 which showed very mild obstructive sleep apnea and light snoring with normal oxygenation according to the report. RDI was 8 per hour. She had no PLMS. I reviewed the report. Her weight was 148 lb at the time (today at 169 lb).   She reports an approximately 20 pound weight gain this year. She has had a lot of stress.  Her daughter has been diagnosed with autonomic dysfunction and her father has advanced dementia. Her daughter lives in [place] and her parents live in [place]. She has been traveling back and forth to [place] in [place] a lot this year. Both parents have sleep apnea and uses CPAP machine, her mother has recently changed to oxygen only as she needs to hear her father at night. The patient reports morning headaches occasionally. She has no significant nocturia. She tries to keep a bedtime of 11 PM but has trouble going to sleep and staying asleep. She has been taking Xanax or clonazepam at night for sleep. She has previously tried Ambien, Lunesta, and Rozerem for sleep with limited or no success. 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 She quit smoking  in 1983, she does not drink alcohol regularly and drinks about 3 cups of coffee per day, latest at lunch. She does not typically drink sodas or tea. She lives at home with her husband. She has 1 son and 2 daughters, all grown. She denies restless leg symptoms or leg twitching at night. Her Epworth sleepiness score is 12 out of 24 today, her fatigue score is 45 out of 63 today.  Her Past Medical History Is Significant For: Past Medical History:  Diagnosis Date   Anxiety    Anxiety    Asthma    Complication of anesthesia    told by anesthesia had small airway   Family history of adverse reaction to anesthesia    daughter has severe ponv   Fibromyalgia    GERD (gastroesophageal reflux disease)    Hyperlipidemia    Hypertension    Insomnia    Migraines    Mitral valve prolapse    OSA (obstructive sleep apnea)    on CPAP   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 02/03/2021   PONV (postoperative nausea and vomiting)    with general anesthesia severe ponv, does ok with propofol   Vitamin D deficiency     Her Past Surgical History Is Significant For: Past Surgical History:  Procedure Laterality  Date   ABDOMINAL HYSTERECTOMY     partial   BIOPSY  06/12/2021   Procedure: BIOPSY;  Surgeon: Mauri Pole, MD;  Location: [facility];  Service: Endoscopy;;   BRAVO [place] STUDY N/A 02/06/2013   Procedure: BRAVO [place];  Surgeon: Juanita Craver, MD;  Location: [facility];  Service: Endoscopy;  Laterality: N/A;   CARPAL TUNNEL RELEASE Right    CHOLECYSTECTOMY     COLONOSCOPY WITH PROPOFOL N/A 10/09/2016   Procedure: COLONOSCOPY WITH PROPOFOL;  Surgeon: Carol Ada, MD;  Location: [facility];  Service: Endoscopy;  Laterality: N/A;   COLONOSCOPY WITH PROPOFOL N/A 06/12/2021   Procedure: COLONOSCOPY WITH PROPOFOL;  Surgeon: Mauri Pole, MD;  Location: [facility];  Service: Endoscopy;  Laterality: N/A;   colonscopy  2012   ESOPHAGOGASTRODUODENOSCOPY N/A 02/06/2013   Procedure: ESOPHAGOGASTRODUODENOSCOPY (EGD);  Surgeon: Juanita Craver, MD;  Location: [facility];  Service: Endoscopy;  Laterality: N/A;   ESOPHAGOGASTRODUODENOSCOPY (EGD) WITH PROPOFOL N/A 10/09/2016   Procedure: ESOPHAGOGASTRODUODENOSCOPY (EGD) WITH PROPOFOL;  Surgeon: Carol Ada, MD;  Location: [facility];  Service: Endoscopy;  Laterality: N/A;   ESOPHAGOGASTRODUODENOSCOPY (EGD) WITH PROPOFOL N/A 06/12/2021   Procedure: ESOPHAGOGASTRODUODENOSCOPY (EGD) WITH PROPOFOL;  Surgeon: Mauri Pole, MD;  Location: [facility];  Service: Endoscopy;  Laterality: N/A;   HEMORRHOIDECTOMY WITH HEMORRHOID BANDING     HOT HEMOSTASIS N/A 06/12/2021   Procedure: HOT HEMOSTASIS (ARGON PLASMA COAGULATION/BICAP);  Surgeon: Mauri Pole, MD;  Location: [facility];  Service: Endoscopy;  Laterality: N/A;   NASAL SEPTUM SURGERY     POLYPECTOMY  06/12/2021   Procedure: POLYPECTOMY;  Surgeon: Mauri Pole, MD;  Location: [facility];  Service: Endoscopy;;   RECTOCELE REPAIR     TMJ ARTHROPLASTY     and removal   TUBAL LIGATION     VESICOVAGINAL FISTULA CLOSURE W/ TAH      Her Family History Is Significant  For: Family History  Problem Relation Age of Onset   Arthritis Mother    Hyperlipidemia Mother    Hypertension Mother    Sleep apnea Mother    Colon cancer Mother 17   Lung cancer Mother    Arthritis Father  Hyperlipidemia Father    Heart disease Father    Stroke Father    Hypertension Father    Sleep apnea Father    Arthritis Other    Breast cancer Other    Diabetes Other    Sleep apnea Brother    Pancreatic cancer Maternal Uncle    Colon polyps Brother    Atrial fibrillation Brother    Liver disease Neg Hx    Esophageal cancer Neg Hx    Stomach cancer Neg Hx    Rectal cancer Neg Hx     Her Social History Is Significant For: Social History   Socioeconomic History   Marital status: Married    Spouse name: Not on file   Number of children: 3   Years of education: BS   Highest education level: Not on file  Occupational History   Occupation: [employer]  Tobacco Use   Smoking status: Former    Types: Cigarettes    Quit date: 11/23/1981    Years since quitting: 40.0   Smokeless tobacco: Never  Vaping Use   Vaping Use: Never used  Substance and Sexual Activity   Alcohol use: Never    Comment: Rare, then "zero for ages"   Drug use: No   Sexual activity: Not on file  Other Topics Concern   Not on file  Social History Narrative   Married with grown children. Works for [employer] as a Teacher, early years/pre. Nonsmoker, quit in 1983. No Etoh.   Consumes about 3+ caffeine drinks a day    Social Determinants of Radio broadcast assistant Strain: Not on file  Food Insecurity: Not on file  Transportation Needs: Not on file  Physical Activity: Not on file  Stress: Not on file  Social Connections: Not on file    Her Allergies Are:  Allergies  Allergen Reactions   Pseudoephedrine Hcl Palpitations    Tachycardia   Fentanyl     Does not work well, itching pt prefers not to have   Levofloxacin     Other reaction(s): Other (See Comments): extreme muscle pain   Ephedrine  Palpitations  :   Her Current Medications Are:  Outpatient Encounter Medications as of 11/27/2021  Medication Sig   acetaminophen (TYLENOL) 500 MG tablet Take 500 mg by mouth at bedtime as needed for moderate pain.   albuterol (VENTOLIN HFA) 108 (90 Base) MCG/ACT inhaler Inhale 2 puffs into the lungs every 4 (four) hours as needed for shortness of breath.   ALPRAZolam (XANAX) 1 MG tablet Take 1 mg by mouth 2 (two) times daily as needed for anxiety.   buPROPion (WELLBUTRIN XL) 300 MG 24 hr tablet Take 300 mg by mouth every morning.   dexlansoprazole (DEXILANT) 60 MG capsule Take 60 mg by mouth daily.   famotidine (PEPCID) 40 MG tablet Take 40 mg by mouth daily.   fexofenadine (ALLEGRA) 180 MG tablet Take 180 mg by mouth every evening.   fluconazole (DIFLUCAN) 100 MG tablet Take 100 mg by mouth daily as needed (yeast). For yeast at caesarean scar.   hydrocortisone-pramoxine (ANALPRAM-HC) 2.5-1 % rectal cream Place 1 application rectally daily as needed for hemorrhoids.   Ketotifen Fumarate (ALLERGY EYE DROPS OP) Place 1 drop into both eyes daily as needed (allergies).   lamoTRIgine (LAMICTAL) 200 MG tablet Take 200 mg by mouth daily.   levothyroxine (SYNTHROID) 25 MCG tablet Take 25 mcg by mouth daily.   metoprolol succinate (TOPROL-XL) 25 MG 24 hr tablet TAKE 1 TABLET (25 MG  TOTAL) BY MOUTH DAILY.   montelukast (SINGULAIR) 10 MG tablet Take 1 tablet (10 mg total) by mouth at bedtime.   nystatin cream (MYCOSTATIN) Apply 1 application topically 2 (two) times daily as needed (yeast).   nystatin-triamcinolone (MYCOLOG II) cream Apply 1 application topically 2 (two) times daily as needed (For cesarean incision (yeast at the site)).   olmesartan (BENICAR) 20 MG tablet Take 20 mg by mouth daily.   olopatadine (PATANOL) 0.1 % ophthalmic solution Place 1 drop into both eyes in the morning and at bedtime.   PRESCRIPTION MEDICATION Place 1 application vaginally 2 (two) times a week. Estradiol ellage 0.02%,  0.2mg /37ml   rosuvastatin (CRESTOR) 5 MG tablet Take 1 tablet (5 mg total) by mouth daily.   valACYclovir (VALTREX) 500 MG tablet Take 500 mg by mouth daily.   Vitamin D, Ergocalciferol, (DRISDOL) 1.25 MG (50000 UNIT) CAPS capsule Take 50,000 Units by mouth every Wednesday.   PREVALITE 4 g packet TAKE 1 PACKET (4 G TOTAL) BY MOUTH DAILY. BEFORE SUPPER AT 5 PM   sucralfate (CARAFATE) 1 GM/10ML suspension Take 10 mLs (1 g total) by mouth 4 (four) times daily.   No facility-administered encounter medications on file as of 11/27/2021.  :  Review of Systems:  Out of a complete 14 point review of systems, all are reviewed and negative with the exception of these symptoms as listed below:  Review of Systems  Neurological:        Patient is here for consult for migraines with orgasm. Has happened twice. The first time she thought she was having a stroke. She also reports she has had trouble with her balance, weird headaches, muscle weakness. Also states there is speculation tht she has Ehler's Danlos. Her daughters have it. ESS 11, FSS 39.   Objective:  Neurological Exam  Physical Exam Physical Examination:   Vitals:   11/27/21 1457  BP: 135/77  Pulse: 73    General Examination: The patient is a very pleasant 61 y.o. female in no acute distress. She appears well-developed and well-nourished and well groomed.   HEENT: Normocephalic, atraumatic, pupils are equal, round and reactive to light, extraocular tracking is good without limitation to gaze excursion or nystagmus noted. Hearing is grossly intact. Face is symmetric with normal facial animation. Speech is clear with no dysarthria noted. There is no hypophonia. There is no lip, neck/head, jaw or voice tremor. Neck is supple with full range of passive and active motion. There are no carotid bruits on auscultation. Oropharynx exam reveals: mild mouth dryness, tongue protrudes centrally and palate elevates symmetrically.   Chest: Clear to  auscultation without wheezing, rhonchi or crackles noted.   Heart: S1+S2+0, regular and normal without murmurs, rubs or gallops noted.    Abdomen: Soft, non-tender and non-distended.   Extremities: There is no pitting edema in the distal lower extremities bilaterally.    Skin: Warm and dry without trophic changes noted.    Musculoskeletal: exam reveals no obvious joint deformities.    Neurologically:  Mental status: The patient is awake, alert and oriented in all 4 spheres. Her immediate and remote memory, attention, language skills and fund of knowledge are appropriate. There is no evidence of aphasia, agnosia, apraxia or anomia. Speech is clear with normal prosody and enunciation. Thought process is linear.  Cranial nerves II - XII are as described above under HEENT exam.  Motor exam: Normal bulk, strength and tone is noted. There is no tremor, drift or rebound.  Fine motor skills  and coordination: grossly intact.  Normal finger-to-nose and normal heel-to-shin. Cerebellar testing: No dysmetria or intention tremor. There is no truncal or gait ataxia.  Sensory exam: intact to light touch in the upper and lower extremities.  Gait, station and balance: No problems standing or walking.  Tandem walk is challenging in the beginning but doable.  Romberg is negative. Assessment and Plan:    In summary, SUMIRE HALBLEIB is a very pleasant 61 year old female with an underlying medical history of vitamin D deficiency, hypertension, hyperlipidemia, reflux disease, mitral valve prolapse, migraine headaches, anxiety, asthma, neck pain, chronic sinusitis, mood disorder, low back pain, and mild obesity, who presents for evaluation of her recent headaches.  She had exertional headaches associated with sexual activity within the past 6 months.  She does have a prior history of migraines but has never been on prescription medications for this.  Unfortunately, she has been dealing with a lot of other issues  including GI issues and significant gastritis issues.  She also has been struggling with her mood disorder.  She is on several medications and is in the process of getting an manometry and upper GI endoscopy done.  She is not a good candidate for premedication with a high-dose anti-inflammatory medication.  She is currently on a beta-blocker per cardiology.  She is advised that we should be very cautious with medication treatment of her migraines/exertional headaches.  She is not keen on adding any more medications at this time.  In fact, she recently reduced her Lamictal from 200 mg daily to 100 mg daily.  She is agreeable to using Tylenol as needed for now.  We mutually agreed to pursue a brain MRI, I would like to rule out a structural cause of these new headaches.  I would like to also proceed with a MR angiogram to rule out an aneurysm.  She is agreeable to this approach.  We will call her with her MRI results.  She is in the process of getting a new CPAP machine.  Once she has her new machine, she is advised to call our office to make a follow-up appointment for a recheck.  She still has residual respiratory events.  It is unclear if these are central or obstructive.  She is struggling with the current pressure but is encouraged to continue with the current machine and settings.  Neurological exam is nonfocal and she is reassured.  We talked about headache triggers quite a bit today as well.  We will plan a follow-up once she has her new CPAP machine and we will keep her posted as to her MRI and MRA results by phone call in the interim.  I answered all her questions today and she was in agreement.   I spent 40 minutes in total face-to-face time and in reviewing records during pre-charting, more than 50% of which was spent in counseling and coordination of care, reviewing test results, reviewing medications and treatment regimen and/or in discussing or reviewing the diagnosis of exertional headaches,  migraines, OSA, the prognosis and treatment options. Pertinent laboratory and imaging test results that were available during this visit with the patient were reviewed by me and considered in my medical decision making (see chart for details).

## 2021-12-01 ENCOUNTER — Encounter (HOSPITAL_COMMUNITY): Payer: Self-pay | Admitting: Gastroenterology

## 2021-12-01 NOTE — Telephone Encounter (Signed)
[facility] has confirmed receipt of message to process order for new machine.

## 2021-12-02 ENCOUNTER — Other Ambulatory Visit: Payer: Self-pay

## 2021-12-02 ENCOUNTER — Ambulatory Visit: Payer: 59

## 2021-12-02 DIAGNOSIS — Z9989 Dependence on other enabling machines and devices: Secondary | ICD-10-CM

## 2021-12-02 DIAGNOSIS — G43109 Migraine with aura, not intractable, without status migrainosus: Secondary | ICD-10-CM

## 2021-12-02 DIAGNOSIS — G4484 Primary exertional headache: Secondary | ICD-10-CM | POA: Diagnosis not present

## 2021-12-02 DIAGNOSIS — G4733 Obstructive sleep apnea (adult) (pediatric): Secondary | ICD-10-CM

## 2021-12-02 MED ORDER — GADOBENATE DIMEGLUMINE 529 MG/ML IV SOLN
15.0000 mL | Freq: Once | INTRAVENOUS | Status: AC | PRN
  Administered 2021-12-02: 15 mL via INTRAVENOUS

## 2021-12-04 ENCOUNTER — Telehealth: Payer: Self-pay | Admitting: *Deleted

## 2021-12-04 NOTE — Telephone Encounter (Signed)
-----   Message from [name], MD sent at 12/04/2021 12:18 PM EST ----- Please call patient and advise her that her recent brain MRI and MR angiogram of her head showed normal findings thankfully.  As discussed, we will need to see her in follow-up once she has her new CPAP machine.

## 2021-12-04 NOTE — Telephone Encounter (Signed)
Called pt and LVM (ok per DPR) stating MRI brain and MR Angiogram of head are normal findings thankfully per Dr Rexene Alberts. We will follow-up as discussed after she gets her new CPAP machine.  Asked the patient to give Kayla Sanders a call as soon as she gets her machine so we can set up an appointment that falls within 31 to 90 days after set up. Left office number in message for call back with any questions if needed.

## 2021-12-05 ENCOUNTER — Telehealth: Payer: Self-pay | Admitting: Psychiatry

## 2021-12-05 NOTE — Telephone Encounter (Signed)
Called patient who had an MRI brain on Tuesday. The day after MRI she noted her arm where she had contrast was swollen and painful. Feels her arm is slightly better today but not back to baseline. Called her PCP who told her she may have thrombophlebitis or contrast extravasation. Patient called asking for advice. Recommended taking Tylenol as needed for pain (cannot take ibuprofen due to ulcers) and to apply compress to arm as needed. Advised to see ED evaluation if pain or swelling worsens.  Kayla Sanders 12/05/21 3:09 PM

## 2021-12-08 NOTE — Telephone Encounter (Signed)
Made appt for pt initial cpap for 03-18-2022 at 1415.

## 2021-12-10 ENCOUNTER — Ambulatory Visit (HOSPITAL_COMMUNITY)
Admission: RE | Admit: 2021-12-10 | Discharge: 2021-12-10 | Disposition: A | Discharge status: Home / Self Care | Payer: 59 | Source: Ambulatory Visit | Attending: Gastroenterology | Admitting: Gastroenterology

## 2021-12-10 ENCOUNTER — Encounter (HOSPITAL_COMMUNITY)
Admission: RE | Disposition: A | Discharge status: Home / Self Care | Payer: Self-pay | Source: Ambulatory Visit | Attending: Gastroenterology

## 2021-12-10 DIAGNOSIS — K219 Gastro-esophageal reflux disease without esophagitis: Secondary | ICD-10-CM | POA: Diagnosis not present

## 2021-12-10 DIAGNOSIS — R1310 Dysphagia, unspecified: Secondary | ICD-10-CM | POA: Insufficient documentation

## 2021-12-10 DIAGNOSIS — K2100 Gastro-esophageal reflux disease with esophagitis, without bleeding: Secondary | ICD-10-CM | POA: Diagnosis not present

## 2021-12-10 DIAGNOSIS — R079 Chest pain, unspecified: Secondary | ICD-10-CM | POA: Insufficient documentation

## 2021-12-10 DIAGNOSIS — R12 Heartburn: Secondary | ICD-10-CM | POA: Diagnosis not present

## 2021-12-10 HISTORY — PX: 24 HOUR PH STUDY: SHX5419

## 2021-12-10 HISTORY — PX: PH IMPEDANCE STUDY: SHX5565

## 2021-12-10 HISTORY — PX: ESOPHAGEAL MANOMETRY: SHX5429

## 2021-12-10 SURGERY — MANOMETRY, ESOPHAGUS
Anesthesia: Choice

## 2021-12-10 MED ORDER — LIDOCAINE VISCOUS HCL 2 % MT SOLN
OROMUCOSAL | Status: AC
  Filled 2021-12-10: qty 15

## 2021-12-10 SURGICAL SUPPLY — 2 items
FACESHIELD LNG OPTICON STERILE (SAFETY) IMPLANT
GLOVE BIO SURGEON STRL SZ8 (GLOVE) ×6 IMPLANT

## 2021-12-10 NOTE — Progress Notes (Signed)
Esophageal Manometry done per protocol. Pt tolerated well with out complication. Ph with impedance done per protocol. Pt tolerated well. Instructions given regarding the study and monitor. Pt verbalized understand and return demonstrated use of monitor. Pt will return tomorrow to have probe removed and monitor downloaded.  

## 2021-12-11 ENCOUNTER — Encounter (HOSPITAL_COMMUNITY): Payer: Self-pay | Admitting: Gastroenterology

## 2021-12-17 ENCOUNTER — Encounter (HOSPITAL_COMMUNITY): Payer: Self-pay | Admitting: Gastroenterology

## 2021-12-23 ENCOUNTER — Ambulatory Visit: Payer: BC Managed Care – PPO | Admitting: Neurology

## 2021-12-23 DIAGNOSIS — R12 Heartburn: Secondary | ICD-10-CM

## 2021-12-25 ENCOUNTER — Ambulatory Visit (HOSPITAL_COMMUNITY): Payer: 59 | Admitting: Certified Registered Nurse Anesthetist

## 2021-12-25 ENCOUNTER — Ambulatory Visit (HOSPITAL_COMMUNITY)
Admission: RE | Admit: 2021-12-25 | Discharge: 2021-12-25 | Disposition: A | Discharge status: Home / Self Care | Payer: 59 | Attending: Gastroenterology | Admitting: Gastroenterology

## 2021-12-25 ENCOUNTER — Encounter (HOSPITAL_COMMUNITY): Payer: Self-pay | Admitting: Gastroenterology

## 2021-12-25 ENCOUNTER — Encounter (HOSPITAL_COMMUNITY)
Admission: RE | Disposition: A | Discharge status: Home / Self Care | Payer: Self-pay | Source: Home / Self Care | Attending: Gastroenterology

## 2021-12-25 ENCOUNTER — Other Ambulatory Visit: Payer: Self-pay

## 2021-12-25 DIAGNOSIS — Z87891 Personal history of nicotine dependence: Secondary | ICD-10-CM | POA: Diagnosis not present

## 2021-12-25 DIAGNOSIS — K2100 Gastro-esophageal reflux disease with esophagitis, without bleeding: Secondary | ICD-10-CM | POA: Insufficient documentation

## 2021-12-25 HISTORY — PX: ESOPHAGOGASTRODUODENOSCOPY (EGD) WITH PROPOFOL: SHX5813

## 2021-12-25 SURGERY — ESOPHAGOGASTRODUODENOSCOPY (EGD) WITH PROPOFOL
Anesthesia: Monitor Anesthesia Care

## 2021-12-25 MED ORDER — SUCRALFATE 1 GM/10ML PO SUSP: 1.0000 g | Freq: Four times a day (QID) | ORAL | 3 refills | Status: DC | PRN

## 2021-12-25 MED ORDER — SODIUM CHLORIDE 0.9 % IV SOLN: INTRAVENOUS | Status: DC

## 2021-12-25 MED ORDER — LACTATED RINGERS IV SOLN
INTRAVENOUS | Status: DC | PRN
Start: 2021-12-25 — End: 2021-12-25

## 2021-12-25 MED ORDER — FAMOTIDINE 20 MG PO TABS: 40.0000 mg | ORAL_TABLET | Freq: Every evening | ORAL | 1 refills | Status: DC | PRN

## 2021-12-25 MED ORDER — LACTATED RINGERS IV SOLN
INTRAVENOUS | Status: AC | PRN
  Administered 2021-12-25: 1000 mL via INTRAVENOUS

## 2021-12-25 MED ORDER — LIDOCAINE 2% (20 MG/ML) 5 ML SYRINGE
INTRAMUSCULAR | Status: DC | PRN
Start: 2021-12-25 — End: 2021-12-25
  Administered 2021-12-25: 60 mg via INTRAVENOUS

## 2021-12-25 MED ORDER — PROPOFOL 500 MG/50ML IV EMUL
INTRAVENOUS | Status: DC | PRN
  Administered 2021-12-25: 150 ug/kg/min via INTRAVENOUS

## 2021-12-25 MED ORDER — PROPOFOL 10 MG/ML IV BOLUS
INTRAVENOUS | Status: DC | PRN
  Administered 2021-12-25: 60 mg via INTRAVENOUS
  Administered 2021-12-25: 40 mg via INTRAVENOUS

## 2021-12-25 MED ORDER — ONDANSETRON HCL 4 MG/2ML IJ SOLN
INTRAMUSCULAR | Status: DC | PRN
Start: 2021-12-25 — End: 2021-12-25
  Administered 2021-12-25: 4 mg via INTRAVENOUS

## 2021-12-25 MED ORDER — PROMETHAZINE HCL 25 MG/ML IJ SOLN: 6.2500 mg | INTRAMUSCULAR | Status: DC | PRN

## 2021-12-25 SURGICAL SUPPLY — 15 items

## 2021-12-25 NOTE — Anesthesia Postprocedure Evaluation (Signed)
Anesthesia Post Note  Patient: Kayla Sanders  Procedure(s) Performed: ESOPHAGOGASTRODUODENOSCOPY (EGD) WITH PROPOFOL     Patient location during evaluation: PACU Anesthesia Type: MAC Level of consciousness: awake and alert Pain management: pain level controlled Vital Signs Assessment: post-procedure vital signs reviewed and stable Respiratory status: spontaneous breathing, nonlabored ventilation and respiratory function stable Cardiovascular status: blood pressure returned to baseline Postop Assessment: no apparent nausea or vomiting Anesthetic complications: no   No notable events documented.  Last Vitals:  Vitals:   12/25/21 1017 12/25/21 1027  BP: 118/70 127/74  Pulse: 80 67  Resp: (!) 21 15  Temp:    SpO2: 98% 97%    Last Pain:  Vitals:   12/25/21 1027  TempSrc:   PainSc: 0-No pain                 Marthenia Rolling

## 2021-12-25 NOTE — Anesthesia Preprocedure Evaluation (Addendum)
Anesthesia Evaluation  Patient identified by MRN, date of birth, ID band Patient awake    Reviewed: Allergy & Precautions, NPO status , Patient's Chart, lab work & pertinent test results, reviewed documented beta blocker date and time   History of Anesthesia Complications (+) PONV and history of anesthetic complications  Airway Mallampati: III  TM Distance: >3 FB Neck ROM: Full    Dental no notable dental hx.    Pulmonary asthma , sleep apnea and Continuous Positive Airway Pressure Ventilation , former smoker,    Pulmonary exam normal        Cardiovascular hypertension, Pt. on home beta blockers and Pt. on medications Normal cardiovascular exam+ Valvular Problems/Murmurs MVP      Neuro/Psych  Headaches, Anxiety    GI/Hepatic Neg liver ROS, GERD  Medicated and Controlled,  Endo/Other  negative endocrine ROS  Renal/GU negative Renal ROS  negative genitourinary   Musculoskeletal  (+) Fibromyalgia -  Abdominal   Peds  Hematology negative hematology ROS (+)   Anesthesia Other Findings Day of surgery medications reviewed with patient.  Reproductive/Obstetrics negative OB ROS                            Anesthesia Physical Anesthesia Plan  ASA: 2  Anesthesia Plan: MAC   Post-op Pain Management: Minimal or no pain anticipated   Induction:   PONV Risk Score and Plan: 3 and Treatment may vary due to age or medical condition and Propofol infusion  Airway Management Planned: Natural Airway and Nasal Cannula  Additional Equipment: None  Intra-op Plan:   Post-operative Plan:   Informed Consent: I have reviewed the patients History and Physical, chart, labs and discussed the procedure including the risks, benefits and alternatives for the proposed anesthesia with the patient or authorized representative who has indicated his/her understanding and acceptance.       Plan Discussed with:  CRNA  Anesthesia Plan Comments:         Anesthesia Quick Evaluation

## 2021-12-25 NOTE — H&P (Addendum)
[facility] History and Physical   Primary Care Physician:  Hayden Rasmussen, MD   Reason for Procedure:   Heartburn, persistent GERD symptoms despite PPI  Plan:    EGD for evaluation and possible interventions      HPI: Kayla Sanders is a 61 y.o. female with chronic GERD here for EGD to document mucosal healing of severe erosive esophagitis.  The risks and benefits as well as alternatives of endoscopic procedure(s) have been discussed and reviewed. All questions answered. The patient agrees to proceed.    Past Medical History:  Diagnosis Date   Anxiety    Anxiety    Asthma    Complication of anesthesia    told by anesthesia had small airway   Family history of adverse reaction to anesthesia    daughter has severe ponv   Fibromyalgia    GERD (gastroesophageal reflux disease)    Hyperlipidemia    Hypertension    Insomnia    Migraines    Mitral valve prolapse    OSA (obstructive sleep apnea)    on CPAP   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 02/03/2021   PONV (postoperative nausea and vomiting)    with general anesthesia severe ponv, does ok with propofol   Vitamin D deficiency     Past Surgical History:  Procedure Laterality Date   24 HOUR [place] STUDY  12/10/2021   Procedure: 24 HOUR PH STUDY;  Surgeon: Mauri Pole, MD;  Location: [facility];  Service: Endoscopy;;   ABDOMINAL HYSTERECTOMY     partial   BIOPSY  06/12/2021   Procedure: BIOPSY;  Surgeon: Mauri Pole, MD;  Location: [facility];  Service: Endoscopy;;   BRAVO [place] STUDY N/A 02/06/2013   Procedure: BRAVO [place];  Surgeon: Juanita Craver, MD;  Location: [facility];  Service: Endoscopy;  Laterality: N/A;   CARPAL TUNNEL RELEASE Right    CHOLECYSTECTOMY     COLONOSCOPY WITH PROPOFOL N/A 10/09/2016   Procedure: COLONOSCOPY WITH PROPOFOL;  Surgeon: Carol Ada, MD;  Location: [facility];  Service: Endoscopy;  Laterality: N/A;   COLONOSCOPY WITH  PROPOFOL N/A 06/12/2021   Procedure: COLONOSCOPY WITH PROPOFOL;  Surgeon: Mauri Pole, MD;  Location: [facility];  Service: Endoscopy;  Laterality: N/A;   colonscopy  2012   ESOPHAGEAL MANOMETRY N/A 12/10/2021   Procedure: ESOPHAGEAL MANOMETRY (EM);  Surgeon: Mauri Pole, MD;  Location: [facility];  Service: Endoscopy;  Laterality: N/A;   ESOPHAGOGASTRODUODENOSCOPY N/A 02/06/2013   Procedure: ESOPHAGOGASTRODUODENOSCOPY (EGD);  Surgeon: Juanita Craver, MD;  Location: [facility];  Service: Endoscopy;  Laterality: N/A;   ESOPHAGOGASTRODUODENOSCOPY (EGD) WITH PROPOFOL N/A 10/09/2016   Procedure: ESOPHAGOGASTRODUODENOSCOPY (EGD) WITH PROPOFOL;  Surgeon: Carol Ada, MD;  Location: [facility];  Service: Endoscopy;  Laterality: N/A;   ESOPHAGOGASTRODUODENOSCOPY (EGD) WITH PROPOFOL N/A 06/12/2021   Procedure: ESOPHAGOGASTRODUODENOSCOPY (EGD) WITH PROPOFOL;  Surgeon: Mauri Pole, MD;  Location: [facility];  Service: Endoscopy;  Laterality: N/A;   HEMORRHOIDECTOMY WITH HEMORRHOID BANDING     HOT HEMOSTASIS N/A 06/12/2021   Procedure: HOT HEMOSTASIS (ARGON PLASMA COAGULATION/BICAP);  Surgeon: Mauri Pole, MD;  Location: [facility];  Service: Endoscopy;  Laterality: N/A;   NASAL SEPTUM SURGERY     [place] IMPEDANCE STUDY N/A 12/10/2021   Procedure: [place] IMPEDANCE STUDY;  Surgeon: Mauri Pole, MD;  Location: [facility];  Service: Endoscopy;  Laterality: N/A;   POLYPECTOMY  06/12/2021   Procedure: POLYPECTOMY;  Surgeon: Mauri Pole, MD;  Location:  [facility];  Service: Endoscopy;;   RECTOCELE REPAIR     TMJ ARTHROPLASTY     and removal   TUBAL LIGATION     VESICOVAGINAL FISTULA CLOSURE W/ TAH      Prior to Admission medications   Medication Sig Start Date End Date Taking? Authorizing Provider  acetaminophen (TYLENOL) 500 MG tablet Take 500 mg by mouth at bedtime.   Yes [provider]  albuterol (VENTOLIN HFA) 108 (90 Base) MCG/ACT inhaler  Inhale 2 puffs into the lungs every 4 (four) hours as needed for shortness of breath. 05/20/21  Yes [provider]  ALPRAZolam (XANAX) 1 MG tablet Take 1 mg by mouth at bedtime.   Yes [provider]  Azelastine HCl 137 MCG/SPRAY SOLN Place 1 spray into both nostrils daily as needed for allergies. 10/09/21  Yes [provider]  buPROPion (WELLBUTRIN XL) 300 MG 24 hr tablet Take 300 mg by mouth every morning. 04/11/21  Yes [provider]  dexlansoprazole (DEXILANT) 60 MG capsule Take 60 mg by mouth daily.   Yes [provider]  famotidine (PEPCID) 40 MG tablet Take 40 mg by mouth daily.   Yes [provider]  fexofenadine (ALLEGRA) 180 MG tablet Take 180 mg by mouth every evening.   Yes [provider]  fluconazole (DIFLUCAN) 100 MG tablet Take 100 mg by mouth daily as needed (yeast). For yeast at caesarean scar. 09/24/16  Yes [provider]  hydrocortisone-pramoxine [facility]) 2.5-1 % rectal cream Place 1 application rectally daily as needed for hemorrhoids. 07/06/16  Yes [provider]  lamoTRIgine (LAMICTAL) 100 MG tablet Take 100 mg by mouth daily.   Yes [provider]  levothyroxine (SYNTHROID) 25 MCG tablet Take 25 mcg by mouth daily.   Yes [provider]  metoprolol succinate (TOPROL-XL) 25 MG 24 hr tablet TAKE 1 TABLET (25 MG TOTAL) BY MOUTH DAILY. 08/04/21  Yes Sherren Mocha, MD  montelukast (SINGULAIR) 10 MG tablet Take 1 tablet (10 mg total) by mouth at bedtime. 09/05/13  Yes Rowe Clack, MD  nystatin cream (MYCOSTATIN) Apply 1 application topically 2 (two) times daily as needed (yeast). 05/02/21  Yes [provider]  olmesartan (BENICAR) 20 MG tablet Take 20 mg by mouth daily. 10/21/20  Yes [provider]  olopatadine (PATANOL) 0.1 % ophthalmic solution Place 1 drop into both eyes 2 (two) times daily as needed for allergies.   Yes [provider]   PRESCRIPTION MEDICATION Place 1 application vaginally 2 (two) times a week. Estradiol ellage 0.02%, 0.2mg /8ml   Yes [provider]  rosuvastatin (CRESTOR) 5 MG tablet Take 1 tablet (5 mg total) by mouth daily. 01/31/21  Yes Fay Records, MD  sucralfate (CARAFATE) 1 GM/10ML suspension Take 10 mLs (1 g total) by mouth 4 (four) times daily. 07/25/21 12/25/21 Yes Lexani Corona, Venia Minks, MD  valACYclovir (VALTREX) 500 MG tablet Take 500 mg by mouth daily.   Yes [provider]  Vitamin D, Ergocalciferol, (DRISDOL) 1.25 MG (50000 UNIT) CAPS capsule Take 50,000 Units by mouth every Wednesday. 09/14/20  Yes [provider]    Current Facility-Administered Medications  Medication Dose Route Frequency Provider Last Rate Last Admin   lactated ringers infusion    Continuous PRN Harl Bowie V, MD   1,000 mL at 12/25/21 0850    Allergies as of 11/03/2021 - Review Complete 09/25/2021  Allergen Reaction Noted   Pseudoephedrine hcl Palpitations 11/11/2015   Fentanyl  10/01/2016   Levofloxacin  08/07/2021   Ephedrine Palpitations 11/02/2012    Family History  Problem Relation Age of Onset   Arthritis Mother    Hyperlipidemia Mother    Hypertension Mother    Sleep apnea Mother    Colon cancer Mother 26   Lung cancer Mother    Arthritis Father    Hyperlipidemia Father    Heart disease Father    Stroke Father    Hypertension Father    Sleep apnea Father    Arthritis Other    Breast cancer Other    Diabetes Other    Sleep apnea Brother    Pancreatic cancer Maternal Uncle    Colon polyps Brother    Atrial fibrillation Brother    Liver disease Neg Hx    Esophageal cancer Neg Hx    Stomach cancer Neg Hx    Rectal cancer Neg Hx     Social History   Socioeconomic History   Marital status: Married    Spouse name: Not on file   Number of children: 3   Years of education: BS   Highest education level: Not on file  Occupational History   Occupation: [employer]   Tobacco Use   Smoking status: Former    Types: Cigarettes    Quit date: 11/23/1981    Years since quitting: 40.1   Smokeless tobacco: Never  Vaping Use   Vaping Use: Never used  Substance and Sexual Activity   Alcohol use: Never    Comment: Rare, then "zero for ages"   Drug use: No   Sexual activity: Not on file  Other Topics Concern   Not on file  Social History Narrative   Married with grown children. Works for [employer] as a Teacher, early years/pre. Nonsmoker, quit in 1983. No Etoh.   Consumes about 3+ caffeine drinks a day    Social Determinants of Radio broadcast assistant Strain: Not on file  Food Insecurity: Not on file  Transportation Needs: Not on file  Physical Activity: Not on file  Stress: Not on file  Social Connections: Not on file  Intimate Partner Violence: Not on file    Review of Systems:  All other review of systems negative except as mentioned in the HPI.  Physical Exam: Vital signs in last 24 hours: Temp:  [97.2 F (36.2 C)] 97.2 F (36.2 C) (02/02 0842) Pulse Rate:  [71] 71 (02/02 0842) Resp:  [20] 20 (02/02 0842) BP: (139)/(58) 139/58 (02/02 0842) SpO2:  [100 %] 100 % (02/02 0842) Weight:  [68.5 kg] 68.5 kg (02/02 0842)   General:   Alert, NAD Lungs:  Clear .   Heart:  Regular rate and rhythm Abdomen:  Soft, nontender and nondistended. Neuro/Psych:  Alert and cooperative. Normal mood and affect. A and O x 3   K. Denzil Magnuson , MD [phone]

## 2021-12-25 NOTE — Transfer of Care (Signed)
Immediate Anesthesia Transfer of Care Note  Patient: Kayla Sanders  Procedure(s) Performed: ESOPHAGOGASTRODUODENOSCOPY (EGD) WITH PROPOFOL  Patient Location: PACU and Endoscopy Unit  Anesthesia Type:MAC  Level of Consciousness: awake, alert  and oriented  Airway & Oxygen Therapy: Patient Spontanous Breathing  Post-op Assessment: Report given to RN and Post -op Vital signs reviewed and stable  Post vital signs: Reviewed and stable  Last Vitals:  Vitals Value Taken Time  BP    Temp    Pulse 75 12/25/21 1007  Resp 14 12/25/21 1007  SpO2 98 % 12/25/21 1007  Vitals shown include unvalidated device data.  Last Pain:  Vitals:   12/25/21 0842  TempSrc: Oral  PainSc: 0-No pain         Complications: No notable events documented.

## 2021-12-25 NOTE — Discharge Instructions (Signed)
YOU HAD AN ENDOSCOPIC PROCEDURE TODAY: Refer to the procedure report and other information in the discharge instructions given to you for any specific questions about what was found during the examination. If this information does not answer your questions, please call [place] office at [phone] to clarify.  ° °YOU SHOULD EXPECT: Some feelings of bloating in the abdomen. Passage of more gas than usual. Walking can help get rid of the air that was put into your GI tract during the procedure and reduce the bloating. If you had a lower endoscopy (such as a colonoscopy or flexible sigmoidoscopy) you may notice spotting of blood in your stool or on the toilet paper. Some abdominal soreness may be present for a day or two, also. ° °DIET: Your first meal following the procedure should be a light meal and then it is ok to progress to your normal diet. A half-sandwich or bowl of soup is an example of a good first meal. Heavy or fried foods are harder to digest and may make you feel nauseous or bloated. Drink plenty of fluids but you should avoid alcoholic beverages for 24 hours. If you had a esophageal dilation, please see attached instructions for diet.   ° °ACTIVITY: Your care partner should take you home directly after the procedure. You should plan to take it easy, moving slowly for the rest of the day. You can resume normal activity the day after the procedure however YOU SHOULD NOT DRIVE, use power tools, machinery or perform tasks that involve climbing or major physical exertion for 24 hours (because of the sedation medicines used during the test).  ° °SYMPTOMS TO REPORT IMMEDIATELY: °A gastroenterologist can be reached at any hour. Please call [phone]  for any of the following symptoms:  °Following lower endoscopy (colonoscopy, flexible sigmoidoscopy) °Excessive amounts of blood in the stool  °Significant tenderness, worsening of abdominal pains  °Swelling of the abdomen that is new, acute  °Fever of 100° or  higher  °Following upper endoscopy (EGD, EUS, ERCP, esophageal dilation) °Vomiting of blood or coffee ground material  °New, significant abdominal pain  °New, significant chest pain or pain under the shoulder blades  °Painful or persistently difficult swallowing  °New shortness of breath  °Black, tarry-looking or red, bloody stools ° °FOLLOW UP:  °If any biopsies were taken you will be contacted by phone or by letter within the next 1-3 weeks. Call [phone]  if you have not heard about the biopsies in 3 weeks.  °Please also call with any specific questions about appointments or follow up tests. ° °

## 2021-12-25 NOTE — Op Note (Signed)
[facility] Patient Name: Kayla Sanders Procedure Date: 12/25/2021 MRN: [id number] Attending MD: Mauri Pole , MD Date of Birth: [birth year] CSN: [id number] Age: 61 Admit Type: Outpatient Procedure:                Upper GI endoscopy Indications:              Follow-up of reflux esophagitis, Esophageal reflux                            symptoms that persist despite appropriate therapy Providers:                Mauri Pole, MD, Princess Bruins, RN, Jeanella Cara, RN, Tyna Jaksch Technician Referring MD:              Medicines:                Monitored Anesthesia Care Complications:            No immediate complications. Estimated Blood Loss:     Estimated blood loss: none. Procedure:                Pre-Anesthesia Assessment:                           - Prior to the procedure, a History and Physical                            was performed, and patient medications and                            allergies were reviewed. The patient's tolerance of                            previous anesthesia was also reviewed. The risks                            and benefits of the procedure and the sedation                            options and risks were discussed with the patient.                            All questions were answered, and informed consent                            was obtained. Prior Anticoagulants: The patient has                            taken no previous anticoagulant or antiplatelet                            agents. ASA Grade Assessment: II - A patient with  mild systemic disease. After reviewing the risks                            and benefits, the patient was deemed in                            satisfactory condition to undergo the procedure.                           After obtaining informed consent, the endoscope was                            passed under direct vision.  Throughout the                            procedure, the patient's blood pressure, pulse, and                            oxygen saturations were monitored continuously. The                            [id number]-H190 ([id number]) Olympus endoscope was introduced                            through the mouth, and advanced to the second part                            of duodenum. The upper GI endoscopy was                            accomplished without difficulty. The patient                            tolerated the procedure well. Scope In: Scope Out: Findings:      The Z-line was regular and was found 34 cm from the incisors.      The examined esophagus was normal.      The gastroesophageal flap valve was visualized endoscopically and       classified as Hill Grade II (fold present, opens with respiration).      The stomach was normal.      The cardia and gastric fundus were normal on retroflexion.      The examined duodenum was normal. Impression:               - Z-line regular, 34 cm from the incisors.                           - Normal esophagus.                           - Gastroesophageal flap valve classified as Hill                            Grade II (fold present, opens with respiration).                           -  Normal stomach.                           - Normal examined duodenum.                           - No specimens collected. Moderate Sedation:      Not Applicable - Patient had care per Anesthesia. Recommendation:           - Patient has a contact number available for                            emergencies. The signs and symptoms of potential                            delayed complications were discussed with the                            patient. Return to normal activities tomorrow.                            Written discharge instructions were provided to the                            patient.                           - Resume previous diet.                            - Continue present medications.                           - Return to GI office as previously scheduled. Procedure Code(s):        --- Professional ---                           [phone], Esophagogastroduodenoscopy, flexible,                            transoral; diagnostic, including collection of                            specimen(s) by brushing or washing, when performed                            (separate procedure) Diagnosis Code(s):        --- Professional ---                           K21.00, Gastro-esophageal reflux disease with                            esophagitis, without bleeding CPT copyright 2019 American Medical Association. All rights reserved. The codes documented in this report are preliminary and upon coder review may  be revised to meet current compliance requirements. Mauri Pole, MD 12/25/2021 10:13:15 AM This  report has been signed electronically. Number of Addenda: 0

## 2021-12-26 ENCOUNTER — Encounter (HOSPITAL_COMMUNITY): Payer: Self-pay | Admitting: Gastroenterology

## 2022-01-16 ENCOUNTER — Encounter: Payer: Self-pay | Admitting: Gastroenterology

## 2022-01-16 ENCOUNTER — Ambulatory Visit (INDEPENDENT_AMBULATORY_CARE_PROVIDER_SITE_OTHER): Payer: 59 | Admitting: Gastroenterology

## 2022-01-16 VITALS — BP 110/76 | HR 76 | Ht 61.0 in | Wt 156.0 lb

## 2022-01-16 DIAGNOSIS — K2100 Gastro-esophageal reflux disease with esophagitis, without bleeding: Secondary | ICD-10-CM

## 2022-01-16 DIAGNOSIS — K649 Unspecified hemorrhoids: Secondary | ICD-10-CM

## 2022-01-16 NOTE — Progress Notes (Signed)
Kayla Sanders    [id number]    1961/03/05  Primary Care Physician:Richter, Maebelle Munroe, MD  Referring Physician: Hayden Rasmussen, MD [place],  [place] [address]   Chief complaint: GERD  HPI:  61 year old very pleasant female here for follow-up for GERD   Overall her symptoms are stable, continues to have  breakthrough heartburn She is taking Dexilant daily and Pepcid at bedtime.  She is using Carafate as needed.  Denies any nausea, vomiting, abdominal pain, melena or bright red blood per rectum  EGD December 25, 2021: Irregular Z-line with no significant pathology.  Overall unremarkable exam  Esophageal manometry December 10, 2021: No evidence of major esophageal motility disorder 24-hour pH impedance study showed good acid suppression on PPI with no evidence of increased nonacid gastroesophageal reflux  EGD 06/12/2021: Grade C erosive esophagitis with superficial esophageal ulcers.  Gastroduodenitis.  Biopsies negative for H. pylori.   Colonoscopy June 12, 2021: Colonic AVMs s/p APC, benign tubular adenomas removed, internal hemorrhoids   She saw Dr. Kae Heller at [facility] surgery.  She had gastrin level checked which was mildly elevated on PPI but not concerning for hypergastrinemia   Relevant history: She was previously followed by Dr. Collene Mares is here to establish care She has family history of colon cancer in her mother, resected colon cancer specimen was positive for Lynch syndrome mutation.  Patient underwent genetic testing, was not positive for Lynch syndrome but had MUTYH gene mutation for familial adenomatous polyposis syndrome and recommendation was for patient to have colonoscopy every 5 years    She is s/p cholecystectomy and since she had her gallbladder removed she is having increased fecal urgency and semiformed to liquid stool, worse in the morning after breakfast   She has obstructive sleep apnea and was told she has  difficult airway by anesthesia in the past   EGD October 09, 2016: By Dr. Benson Norway Per report was normal exam.  Esophagus was attempted to dilate with 18 mm Savary which was unsuccessful subsequently used 71mm Savary with mild resistance Colonoscopy October 09, 2016 by Dr. Benson Norway - Two 2 to 3 mm polyps [tubular adenomas] in the ascending colon, removed with a cold snare. Resected and retrieved.   Outpatient Encounter Medications as of 01/16/2022  Medication Sig   acetaminophen (TYLENOL) 500 MG tablet Take 500 mg by mouth at bedtime.   albuterol (VENTOLIN HFA) 108 (90 Base) MCG/ACT inhaler Inhale 2 puffs into the lungs every 4 (four) hours as needed for shortness of breath.   ALPRAZolam (XANAX) 1 MG tablet Take 1 mg by mouth at bedtime.   Azelastine HCl 137 MCG/SPRAY SOLN Place 1 spray into both nostrils daily as needed for allergies.   buPROPion (WELLBUTRIN XL) 300 MG 24 hr tablet Take 300 mg by mouth every morning.   dexlansoprazole (DEXILANT) 60 MG capsule Take 60 mg by mouth daily.   famotidine (PEPCID) 20 MG tablet Take 2 tablets (40 mg total) by mouth at bedtime as needed for heartburn or indigestion.   fexofenadine (ALLEGRA) 180 MG tablet Take 180 mg by mouth every evening.   fluconazole (DIFLUCAN) 100 MG tablet Take 100 mg by mouth daily as needed (yeast). For yeast at caesarean scar.   hydrocortisone-pramoxine (ANALPRAM-HC) 2.5-1 % rectal cream Place 1 application rectally daily as needed for hemorrhoids.   lamoTRIgine (LAMICTAL) 100 MG tablet Take 100 mg by mouth daily.   levothyroxine (SYNTHROID) 25 MCG  tablet Take 25 mcg by mouth daily.   metoprolol succinate (TOPROL-XL) 25 MG 24 hr tablet TAKE 1 TABLET (25 MG TOTAL) BY MOUTH DAILY.   montelukast (SINGULAIR) 10 MG tablet Take 1 tablet (10 mg total) by mouth at bedtime.   nystatin cream (MYCOSTATIN) Apply 1 application topically 2 (two) times daily as needed (yeast).   olmesartan (BENICAR) 20 MG tablet Take 20 mg by mouth daily.    olopatadine (PATANOL) 0.1 % ophthalmic solution Place 1 drop into both eyes 2 (two) times daily as needed for allergies.   PRESCRIPTION MEDICATION Place 1 application vaginally 2 (two) times a week. Estradiol ellage 0.02%, 0.2mg /50ml   rosuvastatin (CRESTOR) 5 MG tablet Take 1 tablet (5 mg total) by mouth daily.   sucralfate (CARAFATE) 1 GM/10ML suspension Take 10 mLs (1 g total) by mouth 4 (four) times daily as needed.   valACYclovir (VALTREX) 500 MG tablet Take 500 mg by mouth daily.   Vitamin D, Ergocalciferol, (DRISDOL) 1.25 MG (50000 UNIT) CAPS capsule Take 50,000 Units by mouth every Wednesday.   No facility-administered encounter medications on file as of 01/16/2022.    Allergies as of 01/16/2022 - Review Complete 01/16/2022  Allergen Reaction Noted   Pseudoephedrine hcl Palpitations 11/11/2015   Fentanyl  10/01/2016   Levofloxacin  08/07/2021   Ephedrine Palpitations 11/02/2012    Past Medical History:  Diagnosis Date   Anxiety    Anxiety    Asthma    Complication of anesthesia    told by anesthesia had small airway   Family history of adverse reaction to anesthesia    daughter has severe ponv   Fibromyalgia    GERD (gastroesophageal reflux disease)    Hyperlipidemia    Hypertension    Insomnia    Migraines    Mitral valve prolapse    OSA (obstructive sleep apnea)    on CPAP   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 02/03/2021   PONV (postoperative nausea and vomiting)    with general anesthesia severe ponv, does ok with propofol   Vitamin D deficiency     Past Surgical History:  Procedure Laterality Date   43 HOUR [place] STUDY  12/10/2021   Procedure: 24 HOUR PH STUDY;  Surgeon: Mauri Pole, MD;  Location: [facility];  Service: Endoscopy;;   ABDOMINAL HYSTERECTOMY     partial   BIOPSY  06/12/2021   Procedure: BIOPSY;  Surgeon: Mauri Pole, MD;  Location: [facility];  Service: Endoscopy;;   BRAVO [place] STUDY N/A 02/06/2013    Procedure: BRAVO [place];  Surgeon: Juanita Craver, MD;  Location: [facility];  Service: Endoscopy;  Laterality: N/A;   CARPAL TUNNEL RELEASE Right    CHOLECYSTECTOMY     COLONOSCOPY WITH PROPOFOL N/A 10/09/2016   Procedure: COLONOSCOPY WITH PROPOFOL;  Surgeon: Carol Ada, MD;  Location: [facility];  Service: Endoscopy;  Laterality: N/A;   COLONOSCOPY WITH PROPOFOL N/A 06/12/2021   Procedure: COLONOSCOPY WITH PROPOFOL;  Surgeon: Mauri Pole, MD;  Location: [facility];  Service: Endoscopy;  Laterality: N/A;   colonscopy  2012   ESOPHAGEAL MANOMETRY N/A 12/10/2021   Procedure: ESOPHAGEAL MANOMETRY (EM);  Surgeon: Mauri Pole, MD;  Location: [facility];  Service: Endoscopy;  Laterality: N/A;   ESOPHAGOGASTRODUODENOSCOPY N/A 02/06/2013   Procedure: ESOPHAGOGASTRODUODENOSCOPY (EGD);  Surgeon: Juanita Craver, MD;  Location: [facility];  Service: Endoscopy;  Laterality: N/A;   ESOPHAGOGASTRODUODENOSCOPY (EGD) WITH PROPOFOL N/A 10/09/2016   Procedure: ESOPHAGOGASTRODUODENOSCOPY (EGD) WITH PROPOFOL;  Surgeon: Carol Ada, MD;  Location: [facility];  Service: Endoscopy;  Laterality: N/A;   ESOPHAGOGASTRODUODENOSCOPY (EGD) WITH PROPOFOL N/A 06/12/2021   Procedure: ESOPHAGOGASTRODUODENOSCOPY (EGD) WITH PROPOFOL;  Surgeon: Mauri Pole, MD;  Location: [facility];  Service: Endoscopy;  Laterality: N/A;   ESOPHAGOGASTRODUODENOSCOPY (EGD) WITH PROPOFOL N/A 12/25/2021   Procedure: ESOPHAGOGASTRODUODENOSCOPY (EGD) WITH PROPOFOL;  Surgeon: Mauri Pole, MD;  Location: [facility];  Service: Endoscopy;  Laterality: N/A;   HEMORRHOIDECTOMY WITH HEMORRHOID BANDING     HOT HEMOSTASIS N/A 06/12/2021   Procedure: HOT HEMOSTASIS (ARGON PLASMA COAGULATION/BICAP);  Surgeon: Mauri Pole, MD;  Location: [facility];  Service: Endoscopy;  Laterality: N/A;   NASAL SEPTUM SURGERY     [place] IMPEDANCE STUDY N/A 12/10/2021   Procedure: [facility] IMPEDANCE STUDY;  Surgeon: Mauri Pole,  MD;  Location: [facility];  Service: Endoscopy;  Laterality: N/A;   POLYPECTOMY  06/12/2021   Procedure: POLYPECTOMY;  Surgeon: Mauri Pole, MD;  Location: [facility];  Service: Endoscopy;;   RECTOCELE REPAIR     TMJ ARTHROPLASTY     and removal   TUBAL LIGATION     VESICOVAGINAL FISTULA CLOSURE W/ TAH      Family History  Problem Relation Age of Onset   Arthritis Mother    Hyperlipidemia Mother    Hypertension Mother    Sleep apnea Mother    Colon cancer Mother 19   Lung cancer Mother    Arthritis Father    Hyperlipidemia Father    Heart disease Father    Stroke Father    Hypertension Father    Sleep apnea Father    Arthritis Other    Breast cancer Other    Diabetes Other    Sleep apnea Brother    Pancreatic cancer Maternal Uncle    Colon polyps Brother    Atrial fibrillation Brother    Liver disease Neg Hx    Esophageal cancer Neg Hx    Stomach cancer Neg Hx    Rectal cancer Neg Hx     Social History   Socioeconomic History   Marital status: Married    Spouse name: Not on file   Number of children: 3   Years of education: BS   Highest education level: Not on file  Occupational History   Occupation: [employer]  Tobacco Use   Smoking status: Former    Types: Cigarettes    Quit date: 11/23/1981    Years since quitting: 40.1   Smokeless tobacco: Never  Vaping Use   Vaping Use: Never used  Substance and Sexual Activity   Alcohol use: Never    Comment: Rare, then "zero for ages"   Drug use: No   Sexual activity: Not on file  Other Topics Concern   Not on file  Social History Narrative   Married with grown children. Works for [employer] as a Teacher, early years/pre. Nonsmoker, quit in 1983. No Etoh.   Consumes about 3+ caffeine drinks a day    Social Determinants of Radio broadcast assistant Strain: Not on file  Food Insecurity: Not on file  Transportation Needs: Not on file  Physical Activity: Not on file  Stress: Not on file  Social Connections:  Not on file  Intimate Partner Violence: Not on file      Review of systems: All other review of systems negative except as mentioned in the HPI.   Physical Exam: Vitals:   01/16/22 0813  BP: 110/76  Pulse: 76  Body mass index is 29.48 kg/m. Gen:      No acute distress HEENT:  sclera anicteric Abd:      soft, non-tender; no palpable masses, no distension Ext:    No edema Neuro: alert and oriented x 3 Psych: normal mood and affect  Data Reviewed:  Reviewed labs, radiology imaging, old records and pertinent past GI work up   Assessment and Plan/Recommendations:  61 year old very pleasant female with complaints of persistent breakthrough GERD symptoms despite high-dose PPI and Carafate She had findings of severe erosive esophagitis in 2022, resolved on most recent EGD in February 2023 No significant esophageal dysmotility on esophageal manometry and 24-hour pH study did not show increased nonacid reflux  Continue current regimen and antireflux measures  Briefly discussed possible TIF for antireflux surgery, patient is interested.  Will refer to Dr. Bryan Lemma to evaluate and to discuss the procedure  Return in 6 months or sooner if needed    This visit required >40 minutes of patient care (this includes precharting, chart review, review of results, face-to-face time used for counseling as well as treatment plan and follow-up. The patient was provided an opportunity to ask questions and all were answered. The patient agreed with the plan and demonstrated an understanding of the instructions.  Damaris Hippo , MD    CC: Hayden Rasmussen, MD

## 2022-01-16 NOTE — Patient Instructions (Signed)
Continue Dexilant   Use Carafate and Pepcid as needed.   Please see handout for TIF.   If you are age 61 or older, your body mass index should be between 23-30. Your Body mass index is 29.48 kg/m. If this is out of the aforementioned range listed, please consider follow up with your Primary Care Provider.  If you are age 40 or younger, your body mass index should be between 19-25. Your Body mass index is 29.48 kg/m. If this is out of the aformentioned range listed, please consider follow up with your Primary Care Provider.   ________________________________________________________  The [facility] providers would like to encourage you to use [facility] to communicate with providers for non-urgent requests or questions.  Due to long hold times on the telephone, sending your provider a message by [facility] may be a faster and more efficient way to get a response.  Please allow 48 business hours for a response.  Please remember that this is for non-urgent requests.  _______________________________________________________  Thank you for choosing me and [facility].  Dr.Nandigam

## 2022-01-28 ENCOUNTER — Encounter: Payer: Self-pay | Admitting: Gastroenterology

## 2022-01-28 ENCOUNTER — Other Ambulatory Visit: Payer: Self-pay

## 2022-01-28 ENCOUNTER — Ambulatory Visit (INDEPENDENT_AMBULATORY_CARE_PROVIDER_SITE_OTHER): Payer: 59 | Admitting: Gastroenterology

## 2022-01-28 VITALS — BP 110/70 | HR 75 | Ht 61.0 in | Wt 156.1 lb

## 2022-01-28 DIAGNOSIS — K2100 Gastro-esophageal reflux disease with esophagitis, without bleeding: Secondary | ICD-10-CM | POA: Diagnosis not present

## 2022-01-28 DIAGNOSIS — R12 Heartburn: Secondary | ICD-10-CM

## 2022-01-28 DIAGNOSIS — K449 Diaphragmatic hernia without obstruction or gangrene: Secondary | ICD-10-CM | POA: Diagnosis not present

## 2022-01-28 NOTE — Progress Notes (Signed)
Chief Complaint: GERD, Hiatal hernia  Referring Provider:     Dr. Silverio Decamp   HPI:     Kayla Sanders is a 61 y.o. female with a history of anxiety, asthma, fibromyalgia, HLD, HTN, insomnia, migraines, TMJ dysfunction, mitral valve prolapse, OSA, postop nausea, cholecystectomy, abdominal hysterectomy with vesicovaginal fistula closure, rectocele repair, TMJ arthroplasty, hemorrhoidectomy with banding, nasal septum surgery, referred to me by Dr. Silverio Decamp for evaluation of possible antireflux intervention with Transoral Incisionless Fundoplication (TIF) with a goal to stop or significantly reduce acid suppression therapy.  Long standing hx of GERD since teenage years, with multiple meds over the years as below. Started Dexilant about 10 years ago.   Currently taking Dexilant every morning, famotidine every evening, and Carafate up to 4 times daily.  Continues to have breakthrough heartburn symptoms despite Dexilant and Pepcid.   Recently evaluated by Dr. Windle Guard at [facility] for consideration of HHR and antireflux surgery.  Recommended either robotic hiatal hernia repair with 614 degree fundoplication vs concomitant hiatal hernia repair and TIF.  GERD history: -Index symptoms: Heartburn, regurgitation, throat clearing, NCCP -Exacerbating features: Tomato-based sauces, acidic foods, wine -Medications trialed: Prilosec, Prevacid, Protonix, Zegerid, Capadex, Nexium, Dexilant, Pepcid, Carafate -Current medications: Dexilant 60 mg/day, Pepcid 20 mg every afternoon, Carafate prn every 6H -Complications: hiatal hernia, erosive esophagitis  GERD evaluation: - Last EGD: 12/2021 - UGI series: 09/2021: Small HH, moderate gastroesophageal reflux, no stricture.  Normal motility -Esophageal Manometry: 11/2021: Normal -pH/Impedance: 11/2021 (on PPI): DeMeester score 0.5, pH <4 in 0% of time, very few reflux episodes.  No symptom correlation based on SAP c/w appropriate acid suppression therapy  on medication -Bravo: Failed study 2014  Endoscopic History: - EGD (01/2013): Widely patent GEJ.  Gastric polyps.  Normal Z-line.  Bravo placed but immediately dislodged - EGD (09/2016): Normal esophagus.  Empiric dilation with 16 mm Savary.  Normal stomach and duodenum. - EGD (05/2021): LA Grade C esophagitis with bleeding esophageal ulcers, gastric erosions, duodenal ulcers - EGD (12/2021): Normal Z-line, normal esophagus, Hill grade 2 valve.  Normal stomach and duodenum   GERD-HRQL Questionnaire Score: 36/50    Past Medical History:  Diagnosis Date   Anxiety    Anxiety    Asthma    Complication of anesthesia    told by anesthesia had small airway   Family history of adverse reaction to anesthesia    daughter has severe ponv   Fibromyalgia    GERD (gastroesophageal reflux disease)    Hyperlipidemia    Hypertension    Insomnia    Migraines    Mitral valve prolapse    OSA (obstructive sleep apnea)    on CPAP   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 02/03/2021   PONV (postoperative nausea and vomiting)    with general anesthesia severe ponv, does ok with propofol   Vitamin D deficiency      Past Surgical History:  Procedure Laterality Date   24 HOUR [place] STUDY  12/10/2021   Procedure: 24 HOUR PH STUDY;  Surgeon: Mauri Pole, MD;  Location: [facility];  Service: Endoscopy;;   ABDOMINAL HYSTERECTOMY     partial   BIOPSY  06/12/2021   Procedure: BIOPSY;  Surgeon: Mauri Pole, MD;  Location: [facility];  Service: Endoscopy;;   BRAVO [place] STUDY N/A 02/06/2013   Procedure: BRAVO [place];  Surgeon: Juanita Craver, MD;  Location: [facility];  Service: Endoscopy;  Laterality: N/A;   CARPAL TUNNEL RELEASE Right    CHOLECYSTECTOMY     COLONOSCOPY WITH PROPOFOL N/A 10/09/2016   Procedure: COLONOSCOPY WITH PROPOFOL;  Surgeon: Carol Ada, MD;  Location: [facility];  Service: Endoscopy;  Laterality: N/A;   COLONOSCOPY WITH PROPOFOL N/A  06/12/2021   Procedure: COLONOSCOPY WITH PROPOFOL;  Surgeon: Mauri Pole, MD;  Location: [facility];  Service: Endoscopy;  Laterality: N/A;   colonscopy  2012   ESOPHAGEAL MANOMETRY N/A 12/10/2021   Procedure: ESOPHAGEAL MANOMETRY (EM);  Surgeon: Mauri Pole, MD;  [facility]: [facility];  Service: Endoscopy;  Laterality: N/A;   ESOPHAGOGASTRODUODENOSCOPY N/A 02/06/2013   Procedure: ESOPHAGOGASTRODUODENOSCOPY (EGD);  Surgeon: Juanita Craver, MD;  [facility]: [facility];  Service: Endoscopy;  Laterality: N/A;   ESOPHAGOGASTRODUODENOSCOPY (EGD) WITH PROPOFOL N/A 10/09/2016   Procedure: ESOPHAGOGASTRODUODENOSCOPY (EGD) WITH PROPOFOL;  Surgeon: Carol Ada, MD;  [facility];  Service: Endoscopy;  Laterality: N/A;   ESOPHAGOGASTRODUODENOSCOPY (EGD) WITH PROPOFOL N/A 06/12/2021   Procedure: ESOPHAGOGASTRODUODENOSCOPY (EGD) WITH PROPOFOL;  Surgeon: Mauri Pole, MD;  [facility];  Service: Endoscopy;  Laterality: N/A;   ESOPHAGOGASTRODUODENOSCOPY (EGD) WITH PROPOFOL N/A 12/25/2021   Procedure: ESOPHAGOGASTRODUODENOSCOPY (EGD) WITH PROPOFOL;  Surgeon: Mauri Pole, MD;  [facility];  Service: Endoscopy;  Laterality: N/A;   HEMORRHOIDECTOMY WITH HEMORRHOID BANDING     HOT HEMOSTASIS N/A 06/12/2021   Procedure: HOT HEMOSTASIS (ARGON PLASMA COAGULATION/BICAP);  Surgeon: Mauri Pole, MD;  Location: [facility];  Service: Endoscopy;  Laterality: N/A;   NASAL SEPTUM SURGERY     [place] IMPEDANCE STUDY N/A 12/10/2021   Procedure: [place] IMPEDANCE STUDY;  Surgeon: Mauri Pole, MD;  [facility]: [facility];  Service: Endoscopy;  Laterality: N/A;   POLYPECTOMY  06/12/2021   Procedure: POLYPECTOMY;  Surgeon: Mauri Pole, MD;  [facility]: [facility];  Service: Endoscopy;;   RECTOCELE REPAIR     TMJ ARTHROPLASTY     and removal   TUBAL LIGATION     VESICOVAGINAL FISTULA CLOSURE W/ TAH     Family History  Problem Relation Age of Onset    Arthritis Mother    Hyperlipidemia Mother    Hypertension Mother    Sleep apnea Mother    Colon cancer Mother 45   Lung cancer Mother    Arthritis Father    Hyperlipidemia Father    Heart disease Father    Stroke Father    Hypertension Father    Sleep apnea Father    Arthritis Other    Breast cancer Other    Diabetes Other    Sleep apnea Brother    Pancreatic cancer Maternal Uncle    Colon polyps Brother    Atrial fibrillation Brother    Liver disease Neg Hx    Esophageal cancer Neg Hx    Stomach cancer Neg Hx    Rectal cancer Neg Hx    Social History   Tobacco Use   Smoking status: Former    Types: Cigarettes    Quit date: 11/23/1981    Years since quitting: 40.2   Smokeless tobacco: Never  Vaping Use   Vaping Use: Never used  Substance Use Topics   Alcohol use: Never    Comment: Rare, then "zero for ages"   Drug use: No   Current Outpatient Medications  Medication Sig Dispense Refill   acetaminophen (TYLENOL) 500 MG tablet Take 500 mg by mouth at bedtime.     albuterol (VENTOLIN HFA) 108 (90 Base) MCG/ACT inhaler Inhale 2  puffs into the lungs every 4 (four) hours as needed for shortness of breath.     ALPRAZolam (XANAX) 1 MG tablet Take 1 mg by mouth at bedtime.     Azelastine HCl 137 MCG/SPRAY SOLN Place 1 spray into both nostrils daily as needed for allergies.     buPROPion (WELLBUTRIN XL) 300 MG 24 hr tablet Take 300 mg by mouth every morning.     dexlansoprazole (DEXILANT) 60 MG capsule Take 60 mg by mouth daily.     famotidine (PEPCID) 20 MG tablet Take 2 tablets (40 mg total) by mouth at bedtime as needed for heartburn or indigestion. 90 tablet 1   fexofenadine (ALLEGRA) 180 MG tablet Take 180 mg by mouth every evening.     hydrocortisone-pramoxine (ANALPRAM-HC) 2.5-1 % rectal cream Place 1 application rectally daily as needed for hemorrhoids.     lamoTRIgine (LAMICTAL) 100 MG tablet Take 100 mg by mouth daily.     levothyroxine (SYNTHROID) 25 MCG tablet Take  25 mcg by mouth daily.     metoprolol succinate (TOPROL-XL) 25 MG 24 hr tablet TAKE 1 TABLET (25 MG TOTAL) BY MOUTH DAILY. 90 tablet 1   montelukast (SINGULAIR) 10 MG tablet Take 1 tablet (10 mg total) by mouth at bedtime. 90 tablet 3   nystatin cream (MYCOSTATIN) Apply 1 application topically 2 (two) times daily as needed (yeast).     olmesartan (BENICAR) 20 MG tablet Take 20 mg by mouth daily.     olopatadine (PATANOL) 0.1 % ophthalmic solution Place 1 drop into both eyes 2 (two) times daily as needed for allergies.     PRESCRIPTION MEDICATION Place 1 application vaginally 2 (two) times a week. Estradiol ellage 0.02%, 0.'2mg'$ /61m     rosuvastatin (CRESTOR) 5 MG tablet Take 1 tablet (5 mg total) by mouth daily. 90 tablet 3   sucralfate (CARAFATE) 1 GM/10ML suspension Take 10 mLs (1 g total) by mouth 4 (four) times daily as needed. 3600 mL 3   valACYclovir (VALTREX) 500 MG tablet Take 500 mg by mouth daily.     Vitamin D, Ergocalciferol, (DRISDOL) 1.25 MG (50000 UNIT) CAPS capsule Take 50,000 Units by mouth every Wednesday.     fluconazole (DIFLUCAN) 100 MG tablet Take 100 mg by mouth daily as needed (yeast). For yeast at caesarean scar. (Patient not taking: Reported on 01/28/2022)     No current facility-administered medications for this visit.   Allergies  Allergen Reactions   Pseudoephedrine Hcl Palpitations    Tachycardia   Fentanyl     Does not work well, itching pt prefers not to have   Levofloxacin     extreme muscle pain   Ephedrine Palpitations     Review of Systems: All systems reviewed and negative except where noted in HPI.     Physical Exam:    Wt Readings from Last 3 Encounters:  01/28/22 156 lb 2 oz (70.8 kg)  01/16/22 156 lb (70.8 kg)  12/25/21 151 lb 0.2 oz (68.5 kg)    BP 110/70    Pulse 75    Ht '5\' 1"'$  (1.549 m)    Wt 156 lb 2 oz (70.8 kg)    SpO2 96%    BMI 29.50 kg/m  Constitutional:  Pleasant, in no acute distress. Psychiatric: Normal mood and affect.  Behavior is normal. Neurological: Alert and oriented to person place and time. Skin: Skin is warm and dry. No rashes noted.   ASSESSMENT AND PLAN;   1) GERD with erosive esophagitis 2)  Hiatal hernia 3) Heartburn  61 year old female with longstanding history of GERD.  Has clear objective evidence of reflux as demonstrated by erosive esophagitis (responsive to increased acid suppression therapy and Carafate) and clear gastroesophageal reflux on UGI series.  She continues to have breakthrough heartburn symptoms, but recent pH/impedance testing with normal DeMeester and normal esophageal acid exposure, indicating good acid control with current therapy.  Again, while she clearly has a history of GERD, I did explain my suspicion about potentially having overlapping functional heartburn which may or may not be corrected by antireflux surgery.  It is conceivable that we can control her reflux well with surgery, but could continue to have functional heartburn type symptoms.  We discussed options at length, to include 1) continue current therapy, 2) hiatal hernia repair and antireflux surgery, 3) trial of SNRI.  She is most interested in the latter 2 options and plan to proceed as below:  - She would like to go home and read about TIF further, but is interested in concomitant robotic hiatal hernia repair and TIF with myself and Dr. Kae Heller - Continue Dexilant - Continue Pepcid - Continue Carafate - Continue antireflux lifestyle/dietary modifications - Tentative plan to start venlafaxine 37.5 mg/day for possible overlapping functional heartburn.  If no response after 7 days, increase to 75 mg/day.  Prior to initiating, will discuss with her PCM, Dr. Horald Pollen as she is currently prescribed Lamictal and Wellbutrin (although documented interaction on Lexi comp check today) - Discussed postoperative diet and activity/exercise limitations at length - Discussed the risk and benefit profile of TIF at length  today, and discussed alternative options such as robotic assisted HHR and partial fundoplication, Lynx, Nissen fundoplication   I spent 45 minutes of time, including in depth chart review, independent review of results as outlined above, communicating results with the patient directly, face-to-face time with the patient, coordinating care, ordering studies and medications as appropriate, and documentation.   Lavena Bullion, DO, FACG  01/28/2022, 3:34 PM   Darron Doom Maebelle Munroe, MD

## 2022-01-28 NOTE — Patient Instructions (Addendum)
If you are age 61 or older, your body mass index should be between 23-30. Your Body mass index is 29.5 kg/m?Marland Kitchen If this is out of the aforementioned range listed, please consider follow up with your Primary Care Provider. ? ?If you are age 61 or younger, your body mass index should be between 19-25. Your Body mass index is 29.5 kg/m?Marland Kitchen If this is out of the aformentioned range listed, please consider follow up with your Primary Care Provider.  ? ?________________________________________________________ ? ?The [facility] providers would like to encourage you to use [facility] to communicate with providers for non-urgent requests or questions.  Due to long hold times on the telephone, sending your provider a message by [facility] may be a faster and more efficient way to get a response.  Please allow 48 business hours for a response.  Please remember that this is for non-urgent requests.  ?_______________________________________________________ ? ?Please call [phone] if any questions or concerns regarding this appointment. ? ?It was a pleasure to see you today! ? ?Gerrit Heck, D.O. ? ?

## 2022-01-29 ENCOUNTER — Encounter: Payer: Self-pay | Admitting: Gastroenterology

## 2022-02-03 ENCOUNTER — Other Ambulatory Visit: Payer: Self-pay | Admitting: Internal Medicine

## 2022-02-04 ENCOUNTER — Ambulatory Visit (INDEPENDENT_AMBULATORY_CARE_PROVIDER_SITE_OTHER): Payer: 59 | Admitting: Adult Health

## 2022-02-04 ENCOUNTER — Encounter: Payer: Self-pay | Admitting: Adult Health

## 2022-02-04 VITALS — BP 122/69 | HR 71 | Ht 61.0 in | Wt 157.0 lb

## 2022-02-04 DIAGNOSIS — G4733 Obstructive sleep apnea (adult) (pediatric): Secondary | ICD-10-CM

## 2022-02-04 DIAGNOSIS — G4484 Primary exertional headache: Secondary | ICD-10-CM

## 2022-02-04 DIAGNOSIS — Z789 Other specified health status: Secondary | ICD-10-CM | POA: Diagnosis not present

## 2022-02-04 DIAGNOSIS — Z9989 Dependence on other enabling machines and devices: Secondary | ICD-10-CM

## 2022-02-04 NOTE — Patient Instructions (Signed)
Lets try autoPAP settings to see if you can tolerate the pressures better - I will place on rechecking a download report in 3-4 weeks to see if this helps improve your apneas. If you continue to have difficulty or apneas remain elevated, will further discuss with Dr. Rexene Alberts to see if you would be a good candidate for inspire device ?

## 2022-02-04 NOTE — Progress Notes (Signed)
?[facility] ?[address] ?[place]. [place] [address] ?[phone] ? ?     OFFICE FOLLOW UP NOTE ? ?[name] ?Date of Birth:  [birth year] ?Medical Record Number:  [id number]  ? ?Reason for visit: CPAP follow-up, exertional headache ? ? ? ?SUBJECTIVE: ? ? ?CHIEF COMPLAINT:  ?Chief Complaint  ?Patient presents with  ? Follow-up  ?  Rm 3 alone here for f/u for initial cpap visit- Pt feels like the seal of her mask is off and the pressure of the CPAP is too much.   ? ? ?HPI:  ? ?Update 02/04/2022 JM: 61 year old female with history of sleep apnea and new onset exertional headaches.  Recently seen by Dr. Rexene Alberts 11/27/2021, received a new CPAP machine and returns for initial CPAP compliance visit.  Review of compliance report as below which shows optimal usage >4 hours at 87% although elevated AHI at 10.3.  She continues to have difficulty tolerating pressure as she feels like it is puffing out her cheeks, swallowing air, causing extreme dry mouth and feels like the seal of her mask is not adequate. She reports trialing multiple different masks, she does report increased comfort with her full face new mask (different brand) but is still having leaks with any type of movement. This will wake her up as well as wake her husband up.  ? ?Epworth Sleepiness Scale 12/24 ?Fatigue severity scale 52/63 ? ?She has not had any additional exertional headaches. Completed MR brain w/wo and MRA head due to new onset exertional headaches which were both unremarkable.  ? ? ? ? ? ? ? ? ? ?Copied from Dr. Guadelupe Sabin prior OV note ?Today, 11/27/2021: She has a history of migraine headaches, but reports that this was years ago and they improved as she went through menopause.  Some 5 years ago she had 1 episode of ocular migraine with a significant visual aura and a milder headache.  She has never really been on any prescription medication for migraines.  She had an episode of severe migraine after sexual activity some 6  months ago and then again about 2 months ago, overall she had only these 2 events.  She had no strokelike symptoms such as one-sided weakness or numbness or tingling or droopy face or slurring of speech.  She has experienced other new symptoms in the interim including balance issues, feeling foggy headed, concern for Ehlers-Danlos syndrome as 1 daughter has been diagnosed with this condition and another daughter has symptoms similar to EDS. patient primary care within the last 2 months.  She has been using her old CPAP machine.  She has not quite started with a new machine.  I was able to review her CPAP compliance data from 10/28/2021 through 11/26/2021, which is a total of 30 days, during which time she used her machine every night except for 1 night, percent use days greater than 4 hours at 87%, indicating very good compliance, average usage of 5 hours and 30 minutes, residual AHI elevated at 11.1/h without breakdown whether she has more obstructive or central events on this report.  Leak acceptable with a 95th percentile at 7.7 L/min on a pressure of 15 cm with EPR of 2.  She reports that she does not always sleep well.  Sometimes she pulls off the mask in the middle of the night and the pressure does seem high for her.  She is willing to try to get an updated machine which may help [place] get a more detailed  breakdown on her residual events and help fine-tune her treatment.  She has an updated eye examination yearly.  Her last eye examination was less than a year ago.   ?  ? ?Previously:  ?  ?01/13/21: (She) was previously diagnosed with obstructive sleep apnea.  I had evaluated her in 2016 for her sleep disorder.  She was advised to proceed with a sleep study.  She did not have a sleep test through our office at the time. She had a subsequent split-night sleep study through [facility] hospital on 08/1916 and the study was interpreted by Dr. Baird Lyons.  AHI at baseline was 17.1/h, O2 nadir 88%.  She was titrated  on CPAP with an optimal pressure of 12 cm.  She had a consultation with ENT, Dr. Wilburn Cornelia in December 2017 and surgical intervention was not recommended at the time.  ?  ?She has been using the [place] view full facemask from [employer].  She does have to tighten it quite a bit.  Last year she had to buy supplies online.  She has changed DME companies over time, first she was with [facility], then [facility], now with [facility] but has not received any supplies from [facility] yet.  She has had significant stressors lately. Sadly, she lost her son about a week ago.  Her daughters who live in [place] are currently staying with her.  Patient is having a difficult time.  She also lost her mom last year in June.  She was mom's caretaker, she had end-stage lung cancer which was diagnosed in February 2021, Mom lived in [place] and patient stayed with her between February and June 2021 until her passing. ?  ?She has trouble sleeping currently which is understandable due to her stress.  She does not have a very set schedule right now.  She is trying to use her machine consistently and I was able to review her compliance data from 12/15/2020 through 01/12/2021 which is a total of 29 days, during which time she used her machine every night with an average usage of 6 hours and 20 minutes, residual AHI elevated at 12.8, breakdown of the events is not available, not sure if her residual events are mostly or exclusively obstructive versus central's.  CPAP pressure of 12 cm with EPR of 2.  She has a mild leak, 95th percentile is 2.4 L/min.  She reports no night to night nocturia but has woken up with a headache at times.  Epworth sleepiness score is 12 out of 24, fatigue severity score is 15 out of 63. ?  ?  ?11/11/15: 61 year old right-handed woman with an underlying medical history of asthma, vitamin D deficiency, seasonal allergies, hyperlipidemia, hypertension, mitral valve prolapse, reflux disease, migraines, anxiety,  obesity and fibromyalgia, who reports snoring and excessive daytime somnolence.  ?I reviewed your office note from 10/08/2015, which you kindly included. Of note, she had a previous sleep study at [facility] on 12/30/2001 which showed very mild obstructive sleep apnea and light snoring with normal oxygenation according to the report. RDI was 8 per hour. She had no PLMS. I reviewed the report. Her weight was 148 lb at the time (today at 169 lb). ?  ?She reports an approximately 20 pound weight gain this year. She has had a lot of stress. Her daughter has been diagnosed with autonomic dysfunction and her father has advanced dementia. Her daughter lives in [place] and her parents live in [place]. She has been traveling back and  forth to [place] in [place] a lot this year. Both parents have sleep apnea and uses CPAP machine, her mother has recently changed to oxygen only as she needs to hear her father at night. The patient reports morning headaches occasionally. She has no significant nocturia. She tries to keep a bedtime of 11 PM but has trouble going to sleep and staying asleep. She has been taking Xanax or clonazepam at night for sleep. She has previously tried Ambien, Lunesta, and Rozerem for sleep with limited or no success. 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 She quit smoking in 1983, she does not drink alcohol regularly and drinks about 3 cups of coffee per day, latest at lunch. She does not typically drink sodas or tea. She lives at home with her husband. She has 1 son and 2 daughters, all grown. She denies restless leg symptoms or leg twitching at night. Her Epworth sleepiness score is 12 out of 24 today, her fatigue score is 45 out of 63 today. ? ? ? ? ? ? ?ROS:   ?14 system review of systems performed and negative with exception of those listed in HPI ? ?PMH:  ?Past Medical History:  ?Diagnosis Date  ? Anxiety   ? Anxiety   ? Asthma   ? Complication of anesthesia   ? told  by anesthesia had small airway  ? Family history of adverse reaction to anesthesia   ? daughter has severe ponv  ? Fibromyalgia   ? GERD (gastroesophageal reflux disease)   ? Hyperlipidemia   ? Hypertension

## 2022-02-06 ENCOUNTER — Encounter: Payer: Self-pay | Admitting: Neurology

## 2022-02-21 ENCOUNTER — Encounter: Payer: Self-pay | Admitting: Gastroenterology

## 2022-02-23 NOTE — Telephone Encounter (Signed)
I never got through to her [facility] office.  I placed another call today (left VM) to make sure she is okay with starting venlafaxine as she is otherwise also prescribed Wellbutrin and Lamictal.  I previously checked, and no medication interactions between these medications and starting venlafaxine, so I think we should be okay to start, pending her PCM agreeing. ? ?Please place Rx for venlafaxine 37.5 mg/day.  If no response after 7 days, increase to 75 mg/day.   ?

## 2022-02-23 NOTE — Telephone Encounter (Signed)
Spoke with Dr. Darron Doom and she stated she was hesitant to add the venlafaxine because pt has yet to follow up with psychiatry as previously recommended. She said without a definitive psychiatric diagnosis she would recommend to hold it for now and she stated that she would contact the pt and encourage her to follow up with psychiatry.  ?

## 2022-02-23 NOTE — Telephone Encounter (Signed)
Thank you for getting in touch with her. That sounds like a reasonable plan. Can implement venlafaxine at a later date if still needed.  ?

## 2022-03-07 ENCOUNTER — Other Ambulatory Visit: Payer: Self-pay | Admitting: Cardiovascular Disease

## 2022-03-10 ENCOUNTER — Telehealth: Payer: Self-pay | Admitting: Adult Health

## 2022-03-10 DIAGNOSIS — G4733 Obstructive sleep apnea (adult) (pediatric): Secondary | ICD-10-CM

## 2022-03-10 NOTE — Telephone Encounter (Signed)
Send mychart message on this recommendation to pt. She sts she is tolerating pressure ok and is agreeable to increasing to pressure of 8.  ? ?I discussed verbally with Kayla Sanders and ok to proceed with VO for pressure change.  ? ?DME company sent message on update and pt advised we would recheck compliance in 30 days.  ?

## 2022-03-10 NOTE — Addendum Note (Signed)
Addended by: Verlin Grills on: 03/10/2022 10:32 AM ? ? Modules accepted: Orders ? ?

## 2022-03-10 NOTE — Telephone Encounter (Signed)
Obtained compliance report since changing CPAP pressure on 3/15 from set pressure to AutoPap 7-18 with EPR level 3 due to difficulty tolerating set pressure of 15.  Compliance report from 02/08/2022 -03/09/2022 showed elevated AHI at 7.4 despite adequate compliance at 90% and low leaks at 7.0.  Pressure in the 95th percentile 16.8 with maximum 17.7.  Apnea index central 0.1, obstructive 5.4 and unknown 0.1. ? ?Please contact patient to see how she feels when she first puts mask on - if tolerating that pressure okay, we can try to increase min pressure to 8 to see if this helps with AHI. If able to make this adjustment, will look at another compliance report in 30 days.  If difficulty tolerating current pressures, will further discuss other options with Dr. Rexene Alberts.  ? ?Of note, on initial set pressure of 14, residual AHI elevated at 11.9/h (see telephone note from 05/29/2021) there Dr. Rexene Alberts increased to set pressure of 15. Download from 01/05/2022 -02/03/2022 showed optimal residual AHI 0.1 on set pressure of 15 although difficulty tolerating pressure (feels like it is puffing out her cheeks, swallowing air, and causing extreme dry mouth).  ?

## 2022-03-18 ENCOUNTER — Ambulatory Visit: Payer: 59 | Admitting: Neurology

## 2022-04-17 ENCOUNTER — Encounter: Payer: Self-pay | Admitting: Gastroenterology

## 2022-04-22 ENCOUNTER — Other Ambulatory Visit (HOSPITAL_COMMUNITY): Payer: Self-pay

## 2022-04-22 ENCOUNTER — Telehealth: Payer: Self-pay | Admitting: Pharmacy Technician

## 2022-04-22 NOTE — Telephone Encounter (Signed)
Patient Advocate Encounter  Received notification from PATIENT that prior authorization for DEXLANSOPRAZOLE '60MG'$  is required.   PA submitted on 5.31.23 Key [id number] Status is pending   [facility] will continue to follow  Luciano Cutter, CPhT Patient Advocate Phone: [phone]

## 2022-04-23 ENCOUNTER — Other Ambulatory Visit: Payer: Self-pay

## 2022-04-23 MED ORDER — DEXLANSOPRAZOLE 60 MG PO CPDR: 60.0000 mg | DELAYED_RELEASE_CAPSULE | Freq: Every day | ORAL | 3 refills | Status: DC

## 2022-04-25 ENCOUNTER — Other Ambulatory Visit (HOSPITAL_COMMUNITY): Payer: Self-pay

## 2022-04-25 NOTE — Telephone Encounter (Signed)
Received notification from [facility] regarding a prior authorization for DEXLANSOPRAZOLE '60MG'$ . Authorization has been APPROVED from 5.31.23 to 5.31.24.   Per test claim, copay for 30 days supply is $0   Authorization # [id number]

## 2022-04-28 NOTE — Telephone Encounter (Signed)
I called the pt and we discussed message. She is agreeable to titration study and will be on the look out for a call from our sleep lab.

## 2022-04-28 NOTE — Telephone Encounter (Signed)
Recent compliance report shows continued elevated AHI despite adequate compliance and previously attempted adjustments.  At this point, request completion of a titration study for more adequate management.

## 2022-04-28 NOTE — Addendum Note (Signed)
Addended by: Frann Rider L on: 04/28/2022 09:47 AM   Modules accepted: Orders

## 2022-05-04 ENCOUNTER — Telehealth: Payer: Self-pay | Admitting: Adult Health

## 2022-05-04 NOTE — Telephone Encounter (Signed)
[employer] pending uploaded notes  

## 2022-05-07 NOTE — Telephone Encounter (Signed)
checked the status on the portal it is still pending

## 2022-05-11 NOTE — Telephone Encounter (Signed)
Checked the status it is still pending.

## 2022-05-13 NOTE — Telephone Encounter (Signed)
CPAP Titration- [employer] Josem Kaufmann: [id number] (exp. 05/04/22 to 08/02/22). Patient is scheduled at [facility] for 05/18/22 at 8 pm

## 2022-05-18 ENCOUNTER — Ambulatory Visit (INDEPENDENT_AMBULATORY_CARE_PROVIDER_SITE_OTHER): Payer: 59 | Admitting: Neurology

## 2022-05-18 DIAGNOSIS — G4733 Obstructive sleep apnea (adult) (pediatric): Secondary | ICD-10-CM | POA: Diagnosis not present

## 2022-05-18 DIAGNOSIS — G4720 Circadian rhythm sleep disorder, unspecified type: Secondary | ICD-10-CM

## 2022-05-28 NOTE — Procedures (Signed)
S PATIENT'S NAME:  Kayla, Sanders DOB:      Feb 24, 1961      MR#:    [id number]     DATE OF RECORDING: 05/18/2022 REFERRING M.D.:  Horald Pollen, MD Study Performed:   CPAP  Titration HISTORY: 61 year old female with a history of sleep apnea and recurrent headaches, who presents for a full night titration study. She has been on CPAP of 15 cm with elevated AHI at 10.3/hour. She continues to have difficulty tolerating the pressure. The patient endorsed the Epworth Sleepiness Scale at 12 points. The patient's weight 157 pounds with a height of 61 (inches), resulting in a BMI of 29.6 kg/m2.   CURRENT MEDICATIONS: Tylenol, Ventolin HFA, Xanax, Azelastine, Wellbutrin XL, Dexilant, Pepcid, Allegra, Diflucan, Analpram-HC, Lamictal, Synthroid, Toprol-XL, Singulair, Mycostatin, Benicar, Patanol, Crestor, Carafate, Valtrex, Vitamin D   PROCEDURE:  This is a multichannel digital polysomnogram utilizing the SomnoStar 11.2 system.  Electrodes and sensors were applied and monitored per AASM Specifications.   EEG, EOG, Chin and Limb EMG, were sampled at 200 Hz.  ECG, Snore and Nasal Pressure, Thermal Airflow, Respiratory Effort, CPAP Flow and Pressure, Oximetry was sampled at 50 Hz. Digital video and audio were recorded.      The patient was fitted with an [place] FFM from [employer]. CPAP was initiated at 8 cmH20 with heated humidity per AASM standards and pressure was advanced to 16 cmH20 because of hypopneas, apneas and desaturations. She was also briefly tried on standard BiPAP of 18/14 and 20/16 cm for a few minutes, but the patient could not tolerate BiPAP therapy. At a CPAP of 16 cm, her AHI was 1.7/hour with supine REM sleep achieved and O2 nadir of 92%.   Patient took alprazolam prior to lights out. Lights Out was at 21:14 and Lights On at 04:59. Total recording time (TRT) was 465.5 minutes, with a total sleep time (TST) of 335 minutes. The patient's sleep latency was 64.5 minutes, which is delayed. REM latency  was 59.5 minutes, which is slightly reduced. The sleep efficiency was 72. %.    SLEEP ARCHITECTURE: WASO (Wake after sleep onset)  was 66 minutes with one longer period of wakefulness and otherwise mild sleep fragmentation noted.  There were 11.5 minutes in Stage N1, 139.5 minutes Stage N2, 84.5 minutes Stage N3 and 99.5 minutes in Stage REM.  The percentage of Stage N1 was 3.4%, Stage N2 was 41.6%, which is mildly reduced, Stage N3 was 25.2%, which is mildly increased, and Stage R (REM sleep) was 29.7%, which is increased, and probable rebound. The arousals were noted as: 31 were spontaneous, 0 were associated with PLMs, 22 were associated with respiratory events.  RESPIRATORY ANALYSIS:  There was a total of 66 respiratory events: 20 obstructive apneas, 1 central apneas and 0 mixed apneas with a total of 21 apneas and an apnea index (AI) of 3.8 /hour. There were 45 hypopneas with a hypopnea index of 8.1/hour. The patient also had 0 respiratory event related arousals (RERAs).      The total APNEA/HYPOPNEA INDEX  (AHI) was 11.8 /hour and the total RESPIRATORY DISTURBANCE INDEX was 11.8 /hour  20 events occurred in REM sleep and 46 events in NREM. The REM AHI was 12.1 /hour versus a non-REM AHI of 11.7 /hour.  The patient spent 151.5 minutes of total sleep time in the supine position and 184 minutes in non-supine. The supine AHI was 26.1, versus a non-supine AHI of 0.0.  OXYGEN SATURATION & C02:  The baseline  02 saturation was 97%, with the lowest being 89%. Time spent below 89% saturation equaled 0 minutes.  PERIODIC LIMB MOVEMENTS:  The patient had a total of 0 Periodic Limb Movements. The Periodic Limb Movement (PLM) index was 0 and the PLM Arousal index was 0 /hour.  Audio and video analysis did not show any abnormal or unusual movements, behaviors, phonations or vocalizations. The patient took one bathroom break. Snoring was reduced. The EKG was in keeping with normal sinus rhythm (NSR).  A  post-study questionnaire was not completed.   DIAGNOSIS:  Obstructive Sleep Apnea  Dysfunctions associated with sleep stages or arousals from sleep  PLANS/RECOMMENDATIONS: This study demonstrates good improvement/near-resolution of the patient's obstructive sleep apnea with CPAP therapy of 16 cm. I recommend adjusting the home treatment setting to a set pressure of 16 cm with EPR of 3, via XS [place] FFM from [employer] (patient's prior mask). The patient should be reminded to be fully compliant with PAP therapy to improve sleep related symptoms and decrease long term cardiovascular risks. Please note, that untreated obstructive sleep apnea may carry additional perioperative morbidity. Patients with significant obstructive sleep apnea should receive perioperative PAP therapy and the surgeons and particularly the anesthesiologist should be informed of the diagnosis and the severity of the sleep disordered breathing. This study shows sleep fragmentation and mildly abnormal sleep stage percentages; these are nonspecific findings and per se do not signify an intrinsic sleep disorder or a cause for the patient's sleep-related symptoms. Causes include (but are not limited to) the first night effect of the sleep study, circadian rhythm disturbances, medication effect or an underlying mood disorder or medical problem.  The patient should be cautioned not to drive, work at heights, or operate dangerous or heavy equipment when tired or sleepy. Review and reiteration of good sleep hygiene measures should be pursued with any patient. The patient will be seen in follow-up in the sleep clinic at [facility] for discussion of the test results, symptom and treatment compliance review, further management strategies, etc. The referring provider will be notified of the test results.  I certify that I have reviewed the entire raw data recording prior to the issuance of this report in accordance with the Standards of Accreditation of the  American Academy of Sleep Medicine (AASM)   [name], MD, PhD Diplomat, American Board of Neurology and Sleep Medicine (Neurology and Sleep Medicine)

## 2022-06-01 ENCOUNTER — Telehealth: Payer: Self-pay | Admitting: Adult Health

## 2022-06-01 DIAGNOSIS — Z9989 Dependence on other enabling machines and devices: Secondary | ICD-10-CM

## 2022-06-01 NOTE — Telephone Encounter (Signed)
Please advise patient that recent titration study showed near resolution of OSA with CPAP therapy at 16 therefore Dr. Rexene Alberts recommended adjusting pressure setting to 16 with EPR level 3. Please advise patient, order will be sent to DME company. We will see patient in September as scheduled for follow-up visit.

## 2022-06-02 NOTE — Telephone Encounter (Signed)
My chart message send to the pt updating on study and recommended change.  I have sent order to [employer].

## 2022-07-29 IMAGING — RF DG UGI W/ HIGH DENSITY W/O KUB
9 series · 14 of 24 positions shown · non-contrast
Comparison: Esophagram 09/24/2016

CLINICAL DATA: Esophageal ulcer with bleeding. Gastritis with
hemorrhage. Duodenal ulcer. Chronic antacid medication.

EXAM:
ESOPHOGRAM / BARIUM SWALLOW / BARIUM TABLET STUDY
TECHNIQUE: Combined double contrast and single contrast examination performed
using effervescent crystals, thick barium liquid, and thin barium
liquid. The patient was observed with fluoroscopy swallowing a 13 mm
barium sulphate tablet.
FLUOROSCOPY TIME:  Fluoroscopy Time:  2 minute 18 seconds
Radiation Exposure Index (if provided by the fluoroscopic device):
Number of Acquired Spot Images: 9

[Series 1: one shot · 0.14mm/px · 1 of 1 slices shown (1 of 3)]
[im 1/1]
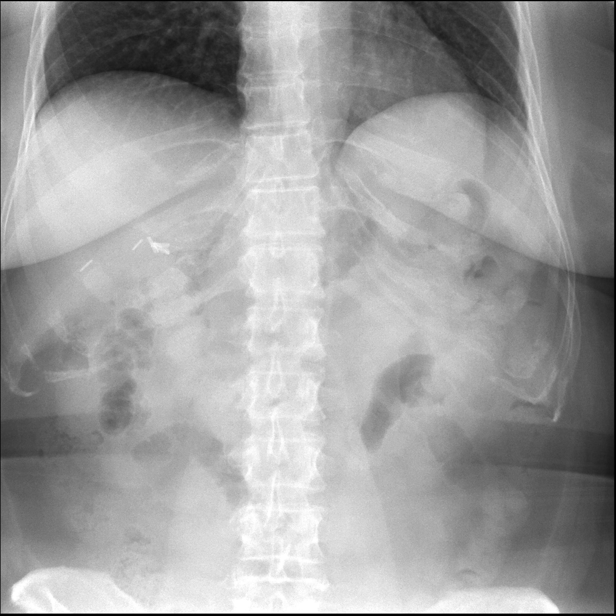

[Series 2: sequence · 0.32mm/px · 1 of 22 frames shown (1 of 6)]
[frame 18/22]
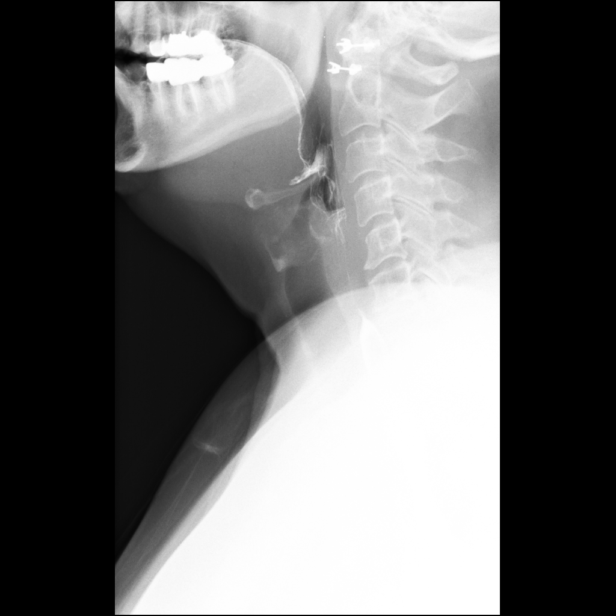

[Series 3: sequence · 0.32mm/px · 1 of 28 frames shown (2 of 6)]
[frame 20/28]
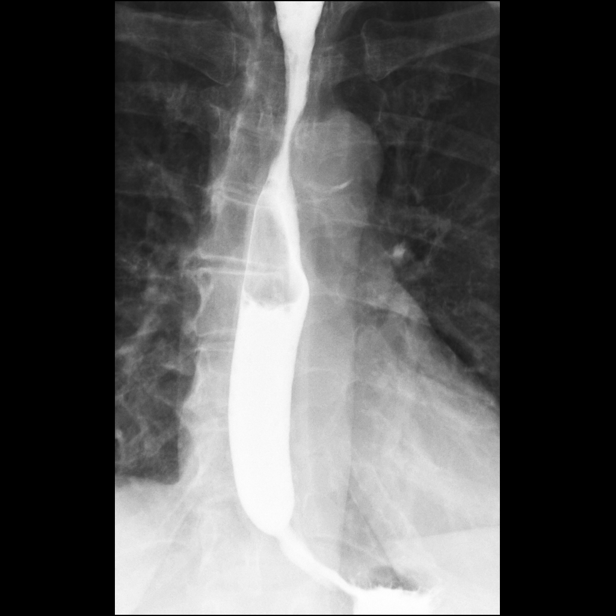

[Series 4: sequence · 0.32mm/px · 2 of 30 frames shown (3 of 6)]
[frame 5/30]
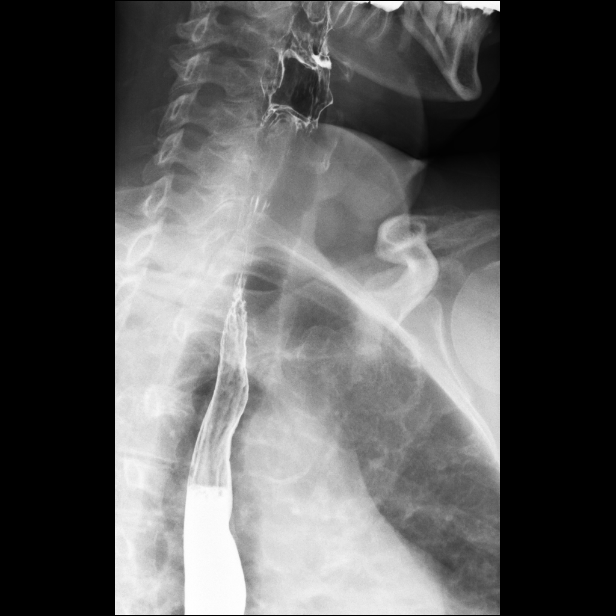
[frame 26/30]
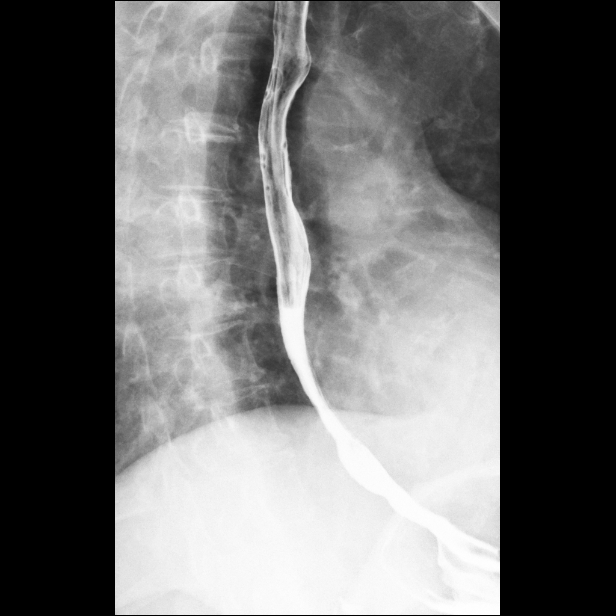

[Series 5: sequence · 0.32mm/px · 1 of 16 frames shown (4 of 6)]
[frame 14/16]
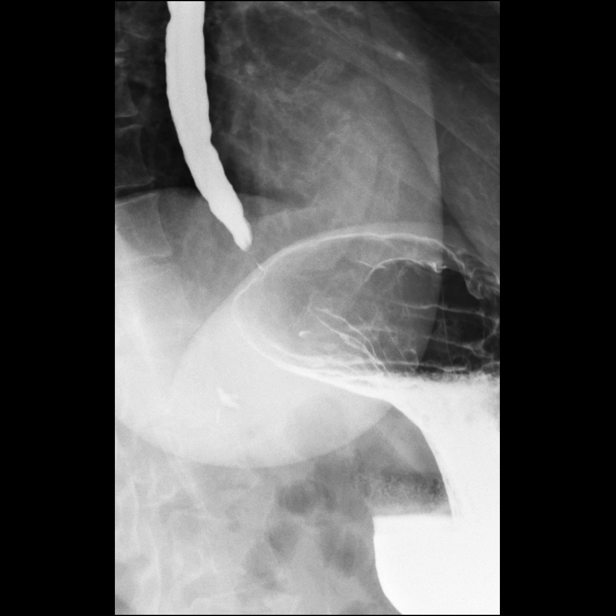

[Series 6: one shot · 0.16mm/px · 2 of 6 slices shown (2 of 3)]
[im 3/6]
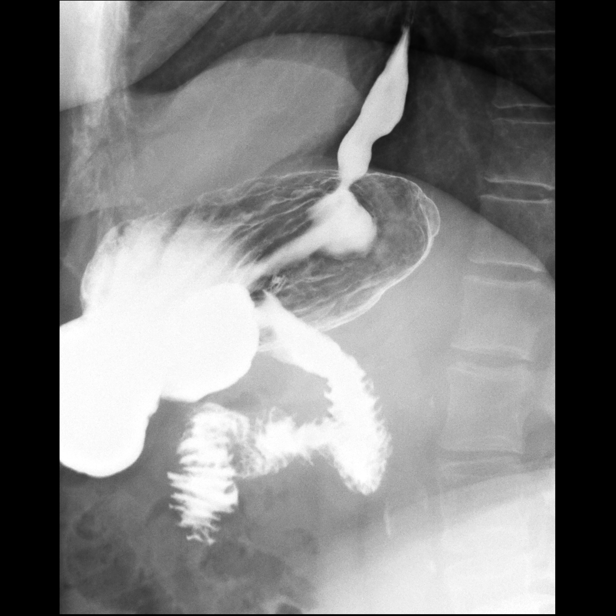
[im 4/6]
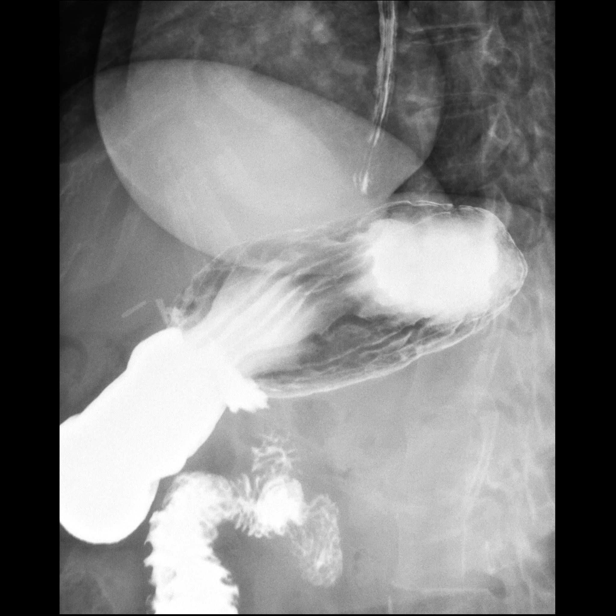

[Series 7: sequence · 0.32mm/px · 1 of 21 frames shown (5 of 6)]
[frame 11/21]
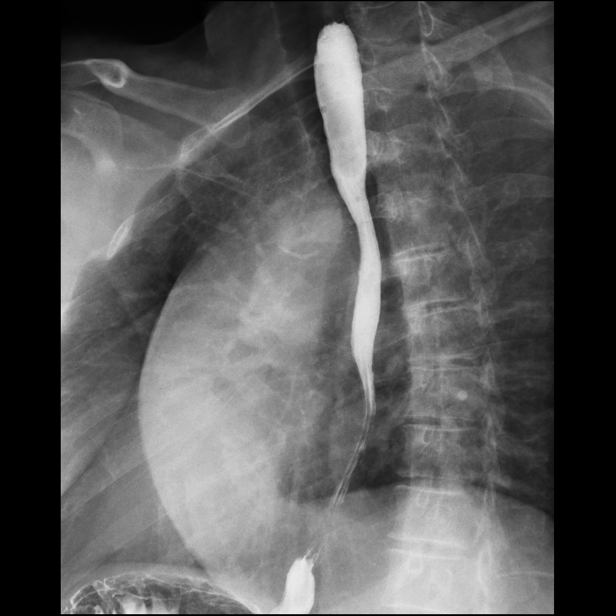

[Series 8: sequence · 0.32mm/px · 1 of 25 frames shown (6 of 6)]
[frame 2/25]
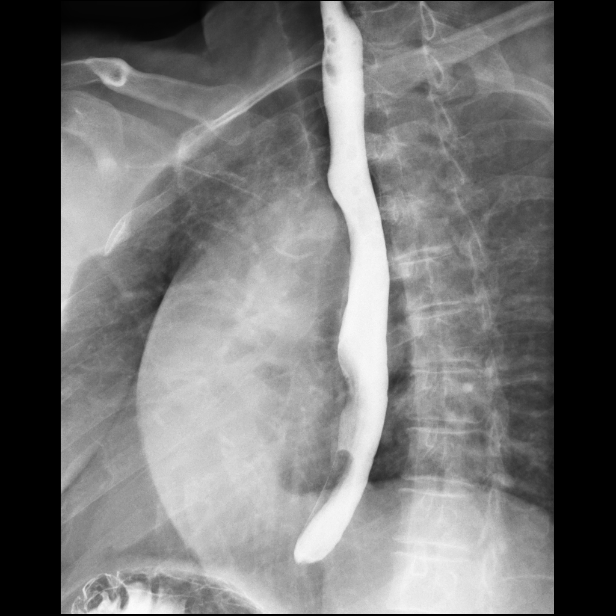

[Series 9: one shot · 0.16mm/px · 4 of 9 slices shown (3 of 3)]
[im 1/9]
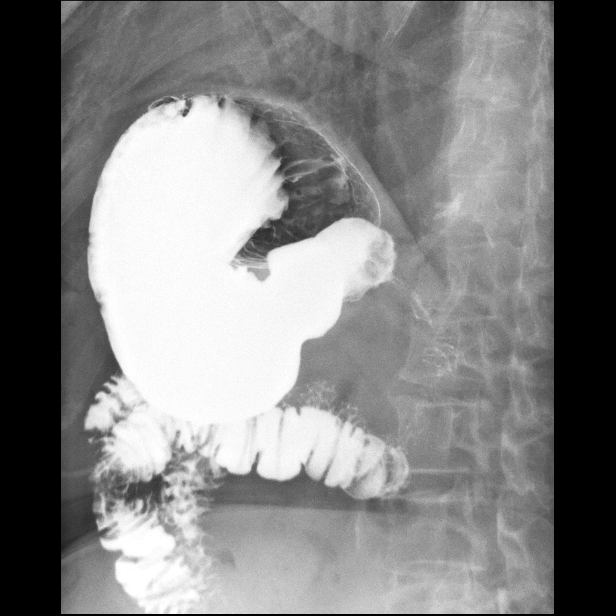
[im 2/9]
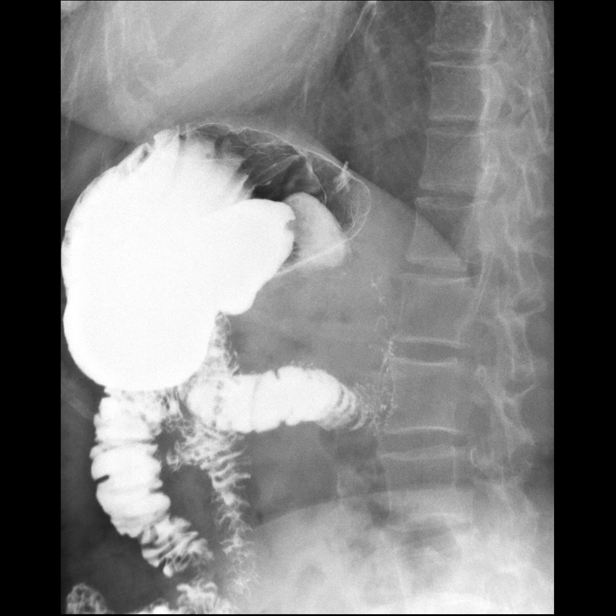
[im 6/9]
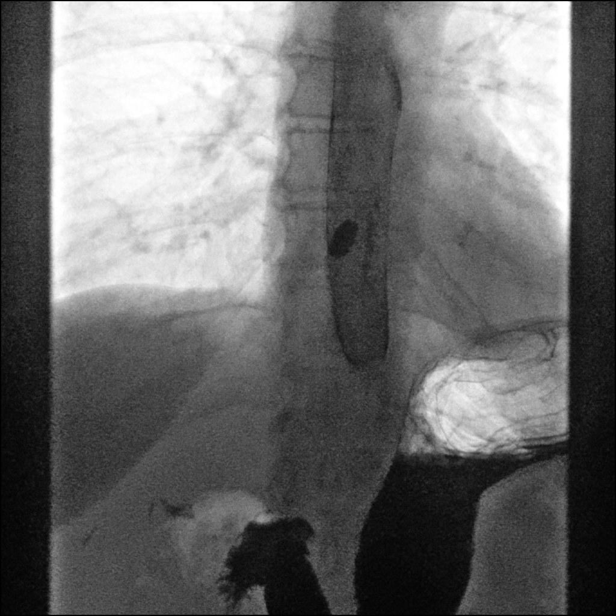
[im 9/9]
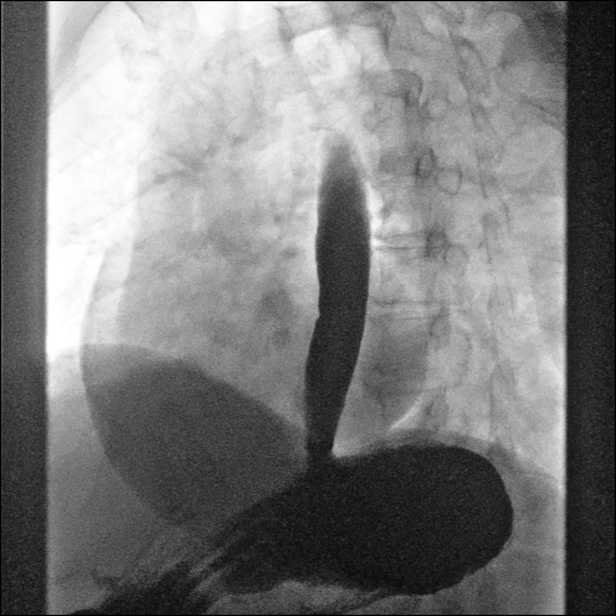

[14 of 24 positions shown; findings below may reference images not displayed]

FINDINGS: Preliminary KUB demonstrates normal bowel gas pattern. Surgical
clips in the gallbladder fossa.

AP and lateral pharyngogram reveals normal swallowing function. No
aspiration, stricture, or diverticulum.

Normal esophageal mucosa and motility. No ulcer or mass. No
stricture identified. Barium tablet passed readily into the stomach
without delay.

Small hiatal hernia. Moderate gastroesophageal reflux while drinking
water

Mild mucosal edema in the stomach may reflect gastritis. No gastric
ulcer or mass. Duodenal bulb normal without ulcer or scarring.
IMPRESSION: Small hiatal hernia with moderate gastroesophageal reflux. No
esophageal stricture

Mild gastric mucosal edema may reflect gastritis. No gastric ulcer.
No duodenal ulcer.

## 2022-08-11 NOTE — Progress Notes (Addendum)
[facility] [address] [place]. [place] [address] [phone]       OFFICE FOLLOW UP NOTE  [name] Date of Birth:  02-01-1961 Medical Record Number:  [id number]   Reason for visit: CPAP follow-up, exertional headache    SUBJECTIVE:   CHIEF COMPLAINT:  Chief Complaint  Patient presents with   Obstructive Sleep Apnea    RM 3 alone Pt is well and stable, no new CPAP concerns     HPI:   Update 08/12/2022 JM: Patient returns for 29-monthCPAP follow-up.  At prior visits, AHI remains elevated despite adequate compliance therefore pursued titration study back in June which showed good improvement/near resolution of apnea under pressure of 16.  She has remained on this pressure setting but residual AHI remains elevated at 9.9.  She is concerned regarding continued elevated AHI that she can see this information on her app. At times, can feel like the mask is going to blow off her face due to the pressure. She continues to feel daytime fatigue and sleepiness and is notably frustrated about this.  She questions if there is any other treatment options.  Epworth Sleepiness Scale 10/24.  Fatigue severity scale 48/63.        History provided for reference purposes only Update 02/04/2022 JM: 61year old female with history of sleep apnea and new onset exertional headaches.  Recently seen by Dr. ARexene Alberts1/03/2022, received a new CPAP machine and returns for initial CPAP compliance visit.  Review of compliance report as below which shows optimal usage >4 hours at 87% although elevated AHI at 10.3.  She continues to have difficulty tolerating pressure as she feels like it is puffing out her cheeks, swallowing air, causing extreme dry mouth and feels like the seal of her mask is not adequate. She reports trialing multiple different masks, she does report increased comfort with her full face new mask (different brand) but is still having leaks with any type of movement.  This will wake her up as well as wake her husband up.   Epworth Sleepiness Scale 12/24 Fatigue severity scale 52/63  She has not had any additional exertional headaches. Completed MR brain w/wo and MRA head due to new onset exertional headaches which were both unremarkable.           Copied from Dr. AGuadelupe Sabinprior OPaxtonnote Today, 11/27/2021: She has a history of migraine headaches, but reports that this was years ago and they improved as she went through menopause.  Some 5 years ago she had 1 episode of ocular migraine with a significant visual aura and a milder headache.  She has never really been on any prescription medication for migraines.  She had an episode of severe migraine after sexual activity some 6 months ago and then again about 2 months ago, overall she had only these 2 events.  She had no strokelike symptoms such as one-sided weakness or numbness or tingling or droopy face or slurring of speech.  She has experienced other new symptoms in the interim including balance issues, feeling foggy headed, concern for Ehlers-Danlos syndrome as 1 daughter has been diagnosed with this condition and another daughter has symptoms similar to EDS. patient primary care within the last 2 months.  She has been using her old CPAP machine.  She has not quite started with a new machine.  I was able to review her CPAP compliance data from 10/28/2021 through 11/26/2021, which is a total of 30 days, during which time  she used her machine every night except for 1 night, percent use days greater than 4 hours at 87%, indicating very good compliance, average usage of 5 hours and 30 minutes, residual AHI elevated at 11.1/h without breakdown whether she has more obstructive or central events on this report.  Leak acceptable with a 95th percentile at 7.7 L/min on a pressure of 15 cm with EPR of 2.  She reports that she does not always sleep well.  Sometimes she pulls off the mask in the middle of the night and the pressure  does seem high for her.  She is willing to try to get an updated machine which may help [place] get a more detailed breakdown on her residual events and help fine-tune her treatment.  She has an updated eye examination yearly.  Her last eye examination was less than a year ago.      Previously:    01/13/21: (She) was previously diagnosed with obstructive sleep apnea.  I had evaluated her in 2016 for her sleep disorder.  She was advised to proceed with a sleep study.  She did not have a sleep test through our office at the time. She had a subsequent split-night sleep study through [facility] on 08/1916 and the study was interpreted by Dr. Baird Lyons.  AHI at baseline was 17.1/h, O2 nadir 88%.  She was titrated on CPAP with an optimal pressure of 12 cm.  She had a consultation with ENT, Dr. Wilburn Cornelia in December 2017 and surgical intervention was not recommended at the time.    She has been using the [place] view full facemask from [employer].  She does have to tighten it quite a bit.  Last year she had to buy supplies online.  She has changed DME companies over time, first she was with [facility], then [facility], now with [facility] but has not received any supplies from [facility] yet.  She has had significant stressors lately. Sadly, she lost her son about a week ago.  Her daughters who live in [place] are currently staying with her.  Patient is having a difficult time.  She also lost her mom last year in June.  She was mom's caretaker, she had end-stage lung cancer which was diagnosed in February 2021, Mom lived in [place] and patient stayed with her between February and June 2021 until her passing.   She has trouble sleeping currently which is understandable due to her stress.  She does not have a very set schedule right now.  She is trying to use her machine consistently and I was able to review her compliance data from 12/15/2020 through 01/12/2021 which is a total of 29 days, during which time  she used her machine every night with an average usage of 6 hours and 20 minutes, residual AHI elevated at 12.8, breakdown of the events is not available, not sure if her residual events are mostly or exclusively obstructive versus central's.  CPAP pressure of 12 cm with EPR of 2.  She has a mild leak, 95th percentile is 2.4 L/min.  She reports no night to night nocturia but has woken up with a headache at times.  Epworth sleepiness score is 12 out of 24, fatigue severity score is 15 out of 63.     11/11/15: 61 year old right-handed woman with an underlying medical history of asthma, vitamin D deficiency, seasonal allergies, hyperlipidemia, hypertension, mitral valve prolapse, reflux disease, migraines, anxiety, obesity and fibromyalgia, who reports snoring and excessive daytime  somnolence.  I reviewed your office note from 10/08/2015, which you kindly included. Of note, she had a previous sleep study at [facility] on 12/30/2001 which showed very mild obstructive sleep apnea and light snoring with normal oxygenation according to the report. RDI was 8 per hour. She had no PLMS. I reviewed the report. Her weight was 148 lb at the time (today at 169 lb).   She reports an approximately 20 pound weight gain this year. She has had a lot of stress. Her daughter has been diagnosed with autonomic dysfunction and her father has advanced dementia. Her daughter lives in [place] and her parents live in [place]. She has been traveling back and forth to [place] in [place] a lot this year. Both parents have sleep apnea and uses CPAP machine, her mother has recently changed to oxygen only as she needs to hear her father at night. The patient reports morning headaches occasionally. She has no significant nocturia. She tries to keep a bedtime of 11 PM but has trouble going to sleep and staying asleep. She has been taking Xanax or clonazepam at night for sleep. She has previously tried Ambien, Lunesta, and Rozerem for  sleep with limited or no success. 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 She quit smoking in 1983, she does not drink alcohol regularly and drinks about 3 cups of coffee per day, latest at lunch. She does not typically drink sodas or tea. She lives at home with her husband. She has 1 son and 2 daughters, all grown. She denies restless leg symptoms or leg twitching at night. Her Epworth sleepiness score is 12 out of 24 today, her fatigue score is 45 out of 63 today.       ROS:   14 system review of systems performed and negative with exception of those listed in HPI  PMH:  Past Medical History:  Diagnosis Date   Anxiety    Anxiety    Asthma    Complication of anesthesia    told by anesthesia had small airway   Family history of adverse reaction to anesthesia    daughter has severe ponv   Fibromyalgia    GERD (gastroesophageal reflux disease)    Hyperlipidemia    Hypertension    Insomnia    Migraines    Mitral valve prolapse    OSA (obstructive sleep apnea)    on CPAP   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 02/03/2021   PONV (postoperative nausea and vomiting)    with general anesthesia severe ponv, does ok with propofol   Vitamin D deficiency     PSH:  Past Surgical History:  Procedure Laterality Date   32 HOUR [place] STUDY  12/10/2021   Procedure: 24 HOUR PH STUDY;  Surgeon: Mauri Pole, MD;  Location: [facility];  Service: Endoscopy;;   ABDOMINAL HYSTERECTOMY     partial   BIOPSY  06/12/2021   Procedure: BIOPSY;  Surgeon: Mauri Pole, MD;  Location: [facility];  Service: Endoscopy;;   BRAVO [place] STUDY N/A 02/06/2013   Procedure: BRAVO [place];  Surgeon: Juanita Craver, MD;  Location: [facility];  Service: Endoscopy;  Laterality: N/A;   CARPAL TUNNEL RELEASE Right    CHOLECYSTECTOMY     COLONOSCOPY WITH PROPOFOL N/A 10/09/2016   Procedure: COLONOSCOPY WITH PROPOFOL;  Surgeon: Carol Ada, MD;  Location: [facility];  Service: Endoscopy;  Laterality: N/A;   COLONOSCOPY WITH PROPOFOL N/A  06/12/2021   Procedure: COLONOSCOPY WITH PROPOFOL;  Surgeon: Mauri Pole, MD;  Location: [facility];  Service: Endoscopy;  Laterality: N/A;   colonscopy  2012   ESOPHAGEAL MANOMETRY N/A 12/10/2021   Procedure: ESOPHAGEAL MANOMETRY (EM);  Surgeon: Mauri Pole, MD;  [facility]: [facility];  Service: Endoscopy;  Laterality: N/A;   ESOPHAGOGASTRODUODENOSCOPY N/A 02/06/2013   Procedure: ESOPHAGOGASTRODUODENOSCOPY (EGD);  Surgeon: Juanita Craver, MD;  [facility]: [facility];  Service: Endoscopy;  Laterality: N/A;   ESOPHAGOGASTRODUODENOSCOPY (EGD) WITH PROPOFOL N/A 10/09/2016   Procedure: ESOPHAGOGASTRODUODENOSCOPY (EGD) WITH PROPOFOL;  Surgeon: Carol Ada, MD;  [facility]: [facility];  Service: Endoscopy;  Laterality: N/A;   ESOPHAGOGASTRODUODENOSCOPY (EGD) WITH PROPOFOL N/A 06/12/2021   Procedure: ESOPHAGOGASTRODUODENOSCOPY (EGD) WITH PROPOFOL;  Surgeon: Mauri Pole, MD;  [facility]: [facility];  Service: Endoscopy;  Laterality: N/A;   ESOPHAGOGASTRODUODENOSCOPY (EGD) WITH PROPOFOL N/A 12/25/2021   Procedure: ESOPHAGOGASTRODUODENOSCOPY (EGD) WITH PROPOFOL;  Surgeon: Mauri Pole, MD;  [facility]: [facility];  Service: Endoscopy;  Laterality: N/A;   HEMORRHOIDECTOMY WITH HEMORRHOID BANDING     HOT HEMOSTASIS N/A 06/12/2021   Procedure: HOT HEMOSTASIS (ARGON PLASMA COAGULATION/BICAP);  Surgeon: Mauri Pole, MD;  Location: [facility];  Service: Endoscopy;  Laterality: N/A;   NASAL SEPTUM SURGERY     [place] IMPEDANCE STUDY N/A 12/10/2021   Procedure: [place] IMPEDANCE STUDY;  Surgeon: Mauri Pole, MD;  Location: [facility];  Service: Endoscopy;  Laterality: N/A;   POLYPECTOMY  06/12/2021   Procedure: POLYPECTOMY;  Surgeon: Mauri Pole, MD;  Location: [facility];  Service: Endoscopy;;   RECTOCELE REPAIR     TMJ ARTHROPLASTY     and removal   TUBAL LIGATION     VESICOVAGINAL  FISTULA CLOSURE W/ TAH      Social History:  Social History   Socioeconomic History   Marital status: Married    Spouse name: Not on file   Number of children: 3   Years of education: BS   Highest education level: Not on file  Occupational History   Occupation: [employer]  Tobacco Use   Smoking status: Former    Types: Cigarettes    Quit date: 11/23/1981    Years since quitting: 40.7   Smokeless tobacco: Never  Vaping Use   Vaping Use: Never used  Substance and Sexual Activity   Alcohol use: Never    Comment: Rare, then "zero for ages"   Drug use: No   Sexual activity: Not on file  Other Topics Concern   Not on file  Social History Narrative   Married with grown children. Works for [employer] as a Teacher, early years/pre. Nonsmoker, quit in 1983. No Etoh.   Consumes about 3+ caffeine drinks a day    Social Determinants of Radio broadcast assistant Strain: Not on file  Food Insecurity: Not on file  Transportation Needs: Not on file  Physical Activity: Not on file  Stress: Not on file  Social Connections: Not on file  Intimate Partner Violence: Not on file    Family History:  Family History  Problem Relation Age of Onset   Arthritis Mother    Hyperlipidemia Mother    Hypertension Mother    Sleep apnea Mother    Colon cancer Mother 56   Lung cancer Mother    Arthritis Father    Hyperlipidemia Father    Heart disease Father    Stroke Father    Hypertension Father    Sleep apnea Father    Arthritis Other  Breast cancer Other    Diabetes Other    Sleep apnea Brother    Pancreatic cancer Maternal Uncle    Colon polyps Brother    Atrial fibrillation Brother    Liver disease Neg Hx    Esophageal cancer Neg Hx    Stomach cancer Neg Hx    Rectal cancer Neg Hx     Medications:   Current Outpatient Medications on File Prior to Visit  Medication Sig Dispense Refill   acetaminophen (TYLENOL) 500 MG tablet Take 500 mg by mouth at bedtime.     albuterol (VENTOLIN  HFA) 108 (90 Base) MCG/ACT inhaler Inhale 2 puffs into the lungs every 4 (four) hours as needed for shortness of breath.     ALPRAZolam (XANAX) 1 MG tablet Take 1 mg by mouth at bedtime.     Azelastine HCl 137 MCG/SPRAY SOLN Place 1 spray into both nostrils daily as needed for allergies.     buPROPion (WELLBUTRIN XL) 300 MG 24 hr tablet Take 300 mg by mouth every morning.     dexlansoprazole (DEXILANT) 60 MG capsule Take 1 capsule (60 mg total) by mouth daily. 90 capsule 3   famotidine (PEPCID) 20 MG tablet Take 2 tablets (40 mg total) by mouth at bedtime as needed for heartburn or indigestion. 90 tablet 1   fexofenadine (ALLEGRA) 180 MG tablet Take 180 mg by mouth every evening.     fluconazole (DIFLUCAN) 100 MG tablet Take 100 mg by mouth daily as needed (yeast). For yeast at caesarean scar.     hydrocortisone-pramoxine (ANALPRAM-HC) 2.5-1 % rectal cream Place 1 application rectally daily as needed for hemorrhoids.     levothyroxine (SYNTHROID) 25 MCG tablet Take 25 mcg by mouth daily.     metoprolol succinate (TOPROL-XL) 25 MG 24 hr tablet TAKE 1 TABLET (25 MG TOTAL) BY MOUTH DAILY. 90 tablet 1   montelukast (SINGULAIR) 10 MG tablet Take 1 tablet (10 mg total) by mouth at bedtime. 90 tablet 3   nystatin cream (MYCOSTATIN) Apply 1 application topically 2 (two) times daily as needed (yeast).     olmesartan (BENICAR) 20 MG tablet Take 20 mg by mouth daily.     olopatadine (PATANOL) 0.1 % ophthalmic solution Place 1 drop into both eyes 2 (two) times daily as needed for allergies.     PRESCRIPTION MEDICATION Place 1 application vaginally 2 (two) times a week. Estradiol ellage 0.02%, 0.'2mg'$ /33m     rosuvastatin (CRESTOR) 5 MG tablet TAKE 1 TABLET (5 MG TOTAL) BY MOUTH DAILY. 90 tablet 3   valACYclovir (VALTREX) 500 MG tablet Take 500 mg by mouth daily.     Vitamin D, Ergocalciferol, (DRISDOL) 1.25 MG (50000 UNIT) CAPS capsule Take 50,000 Units by mouth every Wednesday.     sucralfate (CARAFATE) 1  GM/10ML suspension Take 10 mLs (1 g total) by mouth 4 (four) times daily as needed. 3600 mL 3   No current facility-administered medications on file prior to visit.    Allergies:   Allergies  Allergen Reactions   Pseudoephedrine Hcl Palpitations    Tachycardia   Fentanyl     Does not work well, itching pt prefers not to have   Levofloxacin     extreme muscle pain   Ephedrine Palpitations      OBJECTIVE:  Physical Exam  Vitals:   08/12/22 1511  BP: (!) 101/58  Pulse: 67  Weight: 161 lb (73 kg)  Height: '5\' 1"'$  (1.549 m)   Body mass index is 30.42 kg/m.  No results found.   General: well developed, well nourished, very pleasant middle-age Caucasian female, seated, in no evident distress Head: head normocephalic and atraumatic.   Neck: supple with no carotid or supraclavicular bruits Cardiovascular: regular rate and rhythm, no murmurs Musculoskeletal: no deformity Skin:  no rash/petichiae Vascular:  Normal pulses all extremities   Neurologic Exam Mental Status: Awake and fully alert. Oriented to place and time. Recent and remote memory intact. Attention span, concentration and fund of knowledge appropriate. Mood and affect appropriate.  Cranial Nerves: Pupils equal, briskly reactive to light. Extraocular movements full without nystagmus. Visual fields full to confrontation. Hearing intact. Facial sensation intact. Face, tongue, palate moves normally and symmetrically.  Motor: Normal bulk and tone. Normal strength in all tested extremity muscles Sensory.: intact to touch , pinprick , position and vibratory sensation.  Coordination: Rapid alternating movements normal in all extremities. Finger-to-nose and heel-to-shin performed accurately bilaterally. Gait and Station: Arises from chair without difficulty. Stance is normal. Gait demonstrates normal stride length and balance without use of AD. Tandem walk and heel toe without difficulty.  Reflexes: 1+ and symmetric. Toes  downgoing.         ASSESSMENT: Kayla Sanders is a 61 y.o. year old female with long standing history of sleep apnea    PLAN:  OSA on CPAP : Continue difficulty adequately controlling sleep apnea despite recent titration study. Will increase her pressure to 17 but concerned she may not be able to tolerate as she has difficulty tolerating current pressure at 16. Will repeat download in 6-8 weeks. Will further discuss with Dr. Rexene Alberts re: any other recommendations or any other potential treatment options.  Discussed importance of continued nightly usage and close follow-up with DME company for any needed supplies or CPAP related concerns   ADDENDUM: Obtained repeat download after pressure setting changes on 9/20 but pressure remains at 16 per download report. Will request we reach out to her DME company to see why pressure did not change as previously requested.    Exertional headaches: MR brain and MRA head unremarkable. No reoccurrence.  Discussed these type of headaches are usually self-limiting.  Continue to monitor    No orders of the defined types were placed in this encounter.    Follow-up will be determined after repeat download with pressure setting changes   CC:  PCP: Hayden Rasmussen, MD    I spent 31 minutes of face-to-face and non-face-to-face time with patient.  This included previsit chart review, lab review, study review, order entry, electronic health record documentation, patient education regarding sleep apnea with review and discussion of compliance report and answered all other questions to patient's satisfaction   Frann Rider, [facility]  [facility] [address] [place], [place] [address]  Phone [phone] Fax [phone] Note: This document was prepared with digital dictation and possible smart phrase technology. Any transcriptional errors that result from this process are unintentional.

## 2022-08-12 ENCOUNTER — Ambulatory Visit (INDEPENDENT_AMBULATORY_CARE_PROVIDER_SITE_OTHER): Payer: 59 | Admitting: Adult Health

## 2022-08-12 ENCOUNTER — Encounter: Payer: Self-pay | Admitting: Adult Health

## 2022-08-12 VITALS — BP 101/58 | HR 67 | Ht 61.0 in | Wt 161.0 lb

## 2022-08-12 DIAGNOSIS — Z9989 Dependence on other enabling machines and devices: Secondary | ICD-10-CM

## 2022-08-12 DIAGNOSIS — G4733 Obstructive sleep apnea (adult) (pediatric): Secondary | ICD-10-CM | POA: Diagnosis not present

## 2022-08-19 ENCOUNTER — Encounter: Payer: Self-pay | Admitting: Gastroenterology

## 2022-08-19 ENCOUNTER — Ambulatory Visit (INDEPENDENT_AMBULATORY_CARE_PROVIDER_SITE_OTHER): Payer: 59 | Admitting: Gastroenterology

## 2022-08-19 VITALS — BP 114/70 | HR 71 | Ht 61.0 in | Wt 161.6 lb

## 2022-08-19 DIAGNOSIS — K2100 Gastro-esophageal reflux disease with esophagitis, without bleeding: Secondary | ICD-10-CM | POA: Diagnosis not present

## 2022-08-19 DIAGNOSIS — K449 Diaphragmatic hernia without obstruction or gangrene: Secondary | ICD-10-CM | POA: Diagnosis not present

## 2022-08-19 DIAGNOSIS — R12 Heartburn: Secondary | ICD-10-CM | POA: Diagnosis not present

## 2022-08-19 NOTE — Patient Instructions (Signed)
If you are age 61 or older, your body mass index should be between 23-30. Your There is no height or weight on file to calculate BMI. If this is out of the aforementioned range listed, please consider follow up with your Primary Care Provider.  If you are age 40 or younger, your body mass index should be between 19-25. Your There is no height or weight on file to calculate BMI. If this is out of the aformentioned range listed, please consider follow up with your Primary Care Provider.   __________________________________________________________  The [facility] providers would like to encourage you to use [facility] to communicate with providers for non-urgent requests or questions.  Due to long hold times on the telephone, sending your provider a message by [facility] may be a faster and more efficient way to get a response.  Please allow 48 business hours for a response.  Please remember that this is for non-urgent requests.   Due to recent changes in healthcare laws, you may see the results of your imaging and laboratory studies on MyChart before your provider has had a chance to review them.  We understand that in some cases there may be results that are confusing or concerning to you. Not all laboratory results come back in the same time frame and the provider may be waiting for multiple results in order to interpret others.  Please give [place] 48 hours in order for your provider to thoroughly review all the results before contacting the office for clarification of your results.     Thank you for choosing me and [facility].  Vito Cirigliano, D.O.

## 2022-08-19 NOTE — Progress Notes (Signed)
Chief Complaint:    GERD, Hiatal hernia  GI History: Kayla Sanders is a 61 y.o. female with a history of anxiety, asthma, fibromyalgia, HLD, HTN, insomnia, migraines, TMJ dysfunction, mitral valve prolapse, OSA, postop nausea, cholecystectomy, abdominal hysterectomy with vesicovaginal fistula closure, rectocele repair, TMJ arthroplasty, hemorrhoidectomy with banding, nasal septum surgery, initially referred to me by Dr. Silverio Decamp on 01/28/2022 for evaluation of possible antireflux intervention with Transoral Incisionless Fundoplication (TIF) with a goal to stop or significantly reduce acid suppression therapy.   Long standing hx of GERD since teenage years, with multiple meds over the years as below. Started Dexilant about 10 years ago.    Currently taking Dexilant every morning and Carafate prn.    Evaluated by Dr. Windle Guard at [place] in 12/2021 for consideration of HHR and antireflux surgery.  Recommended either robotic hiatal hernia repair with 462 degree fundoplication vs concomitant hiatal hernia repair and TIF.   GERD history: -Index symptoms: Heartburn, regurgitation, throat clearing, NCCP -Exacerbating features: Tomato-based sauces, acidic foods, wine -Medications trialed: Prilosec, Prevacid, Protonix, Zegerid, Capadex, Nexium, Dexilant, Pepcid, Carafate -Current medications: Dexilant 60 mg/day,  Carafate prn every 6H -Complications: hiatal hernia, erosive esophagitis   GERD evaluation: - Last EGD: 12/2021 - UGI series: 09/2021: Small HH, moderate gastroesophageal reflux, no stricture.  Normal motility -Esophageal Manometry: 11/2021: Normal -pH/Impedance: 11/2021 (on PPI): DeMeester score 0.5, pH <4 in 0% of time, very few reflux episodes.  No symptom correlation based on SAP c/w appropriate acid suppression therapy on medication -Bravo: Failed study 2014   Endoscopic History: - EGD (01/2013): Widely patent GEJ.  Gastric polyps.  Normal Z-line.  Bravo placed but immediately dislodged -  EGD (09/2016): Normal esophagus.  Empiric dilation with 16 mm Savary.  Normal stomach and duodenum. - EGD (05/2021): LA Grade C esophagitis with bleeding esophageal ulcers, gastric erosions, duodenal ulcers - EGD (12/2021): Normal Z-line, normal esophagus, Hill grade 2 valve.  Normal stomach and duodenum    GERD-HRQL Questionnaire Score: 36/50  HPI:     Patient is a 61 y.o. female presenting to the Gastroenterology Clinic for follow-up.  Last seen by me on 01/28/2022 for evaluation of antireflux surgery, specifically TIF.  Discussed starting on venlafaxine for possible overlapping functional heartburn, but her PCM preferred holding off in favor of evaluation psychiatry clinic (additionally, she is also prescribed Lamictal and Wellbutrin, but no medication interactions on Lexi comp check)  Reports reflux symptoms largely controlled with Dexilant.  Stopped taking Pepcid. Will use Carafate at times, typically at night.    Has been accpeted by a Psychiatrist and awaiting initial appt. Will hold off on initiating venlafaxine until seen in that clinic. No longer taking Lamictal.   No new labs or abdominal imaging for review today.  Review of systems:     No chest pain, no SOB, no fevers, no urinary sx   Past Medical History:  Diagnosis Date   Anxiety    Anxiety    Asthma    Complication of anesthesia    told by anesthesia had small airway   Family history of adverse reaction to anesthesia    daughter has severe ponv   Fibromyalgia    GERD (gastroesophageal reflux disease)    Hyperlipidemia    Hypertension    Insomnia    Migraines    Mitral valve prolapse    OSA (obstructive sleep apnea)    on CPAP   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 02/03/2021   PONV (postoperative nausea and  vomiting)    with general anesthesia severe ponv, does ok with propofol   Vitamin D deficiency     Patient's surgical history, family medical history, social history, medications and  allergies were all reviewed in Epic    Current Outpatient Medications  Medication Sig Dispense Refill   acetaminophen (TYLENOL) 500 MG tablet Take 500 mg by mouth at bedtime.     albuterol (VENTOLIN HFA) 108 (90 Base) MCG/ACT inhaler Inhale 2 puffs into the lungs every 4 (four) hours as needed for shortness of breath.     Azelastine HCl 137 MCG/SPRAY SOLN Place 1 spray into both nostrils daily as needed for allergies.     buPROPion (WELLBUTRIN XL) 300 MG 24 hr tablet Take 300 mg by mouth every morning.     carboxymethylcellulose (REFRESH PLUS) 0.5 % SOLN Place 1 drop into both eyes 3 (three) times daily as needed (dry eyes).     dexlansoprazole (DEXILANT) 60 MG capsule Take 1 capsule (60 mg total) by mouth daily. 90 capsule 3   famotidine (PEPCID) 20 MG tablet Take 2 tablets (40 mg total) by mouth at bedtime as needed for heartburn or indigestion. 90 tablet 1   fexofenadine (ALLEGRA) 180 MG tablet Take 180 mg by mouth every evening.     fluconazole (DIFLUCAN) 100 MG tablet Take 100 mg by mouth daily as needed (yeast). For yeast at caesarean scar.     hydrocortisone-pramoxine (ANALPRAM-HC) 2.5-1 % rectal cream Place 1 application rectally daily as needed for hemorrhoids.     levothyroxine (SYNTHROID) 25 MCG tablet Take 25 mcg by mouth daily.     metoprolol succinate (TOPROL-XL) 25 MG 24 hr tablet TAKE 1 TABLET (25 MG TOTAL) BY MOUTH DAILY. 90 tablet 1   montelukast (SINGULAIR) 10 MG tablet Take 1 tablet (10 mg total) by mouth at bedtime. 90 tablet 3   nystatin cream (MYCOSTATIN) Apply 1 application topically 2 (two) times daily as needed (yeast).     olmesartan (BENICAR) 20 MG tablet Take 20 mg by mouth daily.     olopatadine (PATANOL) 0.1 % ophthalmic solution Place 1 drop into both eyes 2 (two) times daily as needed for allergies.     PRESCRIPTION MEDICATION Place 1 application vaginally 2 (two) times a week. Estradiol ellage 0.02%, 0.'2mg'$ /69m     rosuvastatin (CRESTOR) 5 MG tablet TAKE 1  TABLET (5 MG TOTAL) BY MOUTH DAILY. 90 tablet 3   valACYclovir (VALTREX) 500 MG tablet Take 500 mg by mouth daily.     Vitamin D, Ergocalciferol, (DRISDOL) 1.25 MG (50000 UNIT) CAPS capsule Take 50,000 Units by mouth every Wednesday.     ALPRAZolam (XANAX) 1 MG tablet Take 1 mg by mouth at bedtime.     sucralfate (CARAFATE) 1 GM/10ML suspension Take 10 mLs (1 g total) by mouth 4 (four) times daily as needed. 3600 mL 3   No current facility-administered medications for this visit.    Physical Exam:     BP 114/70 (BP Location: Left Arm, Patient Position: Sitting)   Pulse 71   Ht '5\' 1"'$  (1.549 m)   Wt 161 lb 9.6 oz (73.3 kg)   SpO2 97%   BMI 30.53 kg/m   GENERAL:  Pleasant female in NAD PSYCH: : Cooperative, normal affect NEURO: Alert and oriented x 3, no focal neurologic deficits   IMPRESSION and PLAN:    1) GERD with erosive esophagitis 2) Hiatal hernia 3) heartburn  61year old female with longstanding history of GERD.  Has clear objective evidence of  reflux as demonstrated by erosive esophagitis and clear gastroesophageal reflux on UGI series.  Reflux largely responsive to Dexilant, and uses Carafate for breakthrough.  No longer taking Pepcid. pH/impedance testing on PPI with normal DeMeester and normal esophageal acid exposure, indicating good acid control with current therapy.    We again discussed GERD at length today.  He has objective evidence of recent.  Again discussed the potential for overlapping functional heartburn.  We discussed the possibility to control her reflux quite well with hiatal hernia repair and antireflux surgery, but could still have heartburn symptoms from functional overlap.  Decided on the following:  - Continue Dexilant - Continue Carafate for breakthrough - Continue antireflux lifestyle/dietary modifications - Tentative plan to start venlafaxine 37.5 mg/day for possible overlapping functional heartburn after she has a chance to be seen in the  Psychiatry clinic and approved to start SNRI.  No longer taking Lamictal. - Will determine whether or not to proceed with cTIF depending on response to SNRI. - Again, discussed possibility of antireflux surgery as a means to control reflux and stop or significantly reduce the need for acid suppression therapy.  May conceivably still need SNRI for overlapping functional, but at least we have to get rid of PPI, H2 RA, etc. - Dexilant refilled in June   RTC after evaluation in the Psychiatry clinic  I spent 35 minutes of time, including in depth chart review, independent review of results as outlined above, communicating results with the patient directly, face-to-face time with the patient, coordinating care, and ordering studies and medications as appropriate, and documentation.            [place] ,DO, FACG 08/19/2022, 9:08 AM

## 2022-09-18 DIAGNOSIS — F4310 Post-traumatic stress disorder, unspecified: Secondary | ICD-10-CM | POA: Insufficient documentation

## 2022-09-18 DIAGNOSIS — F411 Generalized anxiety disorder: Secondary | ICD-10-CM | POA: Insufficient documentation

## 2022-09-18 DIAGNOSIS — F5101 Primary insomnia: Secondary | ICD-10-CM | POA: Insufficient documentation

## 2022-09-18 DIAGNOSIS — F330 Major depressive disorder, recurrent, mild: Secondary | ICD-10-CM | POA: Insufficient documentation

## 2022-09-21 ENCOUNTER — Telehealth: Payer: Self-pay | Admitting: *Deleted

## 2022-09-21 DIAGNOSIS — G4733 Obstructive sleep apnea (adult) (pediatric): Secondary | ICD-10-CM

## 2022-09-21 NOTE — Telephone Encounter (Signed)
Will recommend further increasing to 18 as there was slight improvement after increasing to 17 (residual AHI 10.3 on pressure of 15 and currently 7.7. Will recheck in 1 month to ensure her central apneas do not worsen. Will place order to DME company requesting pressure increase.

## 2022-09-21 NOTE — Telephone Encounter (Signed)
Please reach out to patient's DME company - order placed for pressure setting changes on 9/20 from 16 to 17, showed changed pressure to 17 on her prescription under [facility] but still at 16 on her download report. Please see what pressure setting is actually at right now and if she at 16, please ensure they change to 17 as previously requested. Called [facility], LVM advising of NP's message. Requested call back to let us know patient's current pressure setting.

## 2022-09-21 NOTE — Telephone Encounter (Signed)
Community message sent to [employer] re: pressure increase settings.

## 2022-09-21 NOTE — Addendum Note (Signed)
Addended by: Frann Rider L on: 09/21/2022 09:47 AM   Modules accepted: Orders

## 2022-09-21 NOTE — Telephone Encounter (Signed)
Per Lattie Haw, phone staff, [place] @ [employer] called and stated patient's current CPAP pressure setting is 17. Notified NP.

## 2022-10-06 DIAGNOSIS — M25561 Pain in right knee: Secondary | ICD-10-CM | POA: Insufficient documentation

## 2022-10-22 NOTE — Telephone Encounter (Signed)
Repeated download over the past 30 days (10/31-11/29), shows slightly elevated AHI at 7.2, pressure on [facility] prescription notes currently at 17 (although noted 16 on DL report), previously requested increased pressure to 18 30 days ago but does not appear this was done. Can this please be looked into? Thank you!

## 2022-10-22 NOTE — Telephone Encounter (Signed)
Noted! Thank you

## 2022-10-22 NOTE — Telephone Encounter (Signed)
I looked at the [facility] and the CPAP is still set at 17 cm water pressure. I was able to make the change in airview increasing the pressure to 18 cm water pressure. I will send the order to [employer] letting them know this was missed. The machine should start registering at the new pressure starting tonight.

## 2022-10-26 NOTE — Progress Notes (Unsigned)
Cardiology Office Note:    Date:  10/28/2022   ID:  Kayla REPETTO, DOB May 28, 1961, MRN [id number]  PCP:  Hayden Rasmussen, MD   [facility] Providers Cardiologist:  Fransico Him, MD     Referring MD: Hayden Rasmussen, MD   Chief Complaint: follow-up palpitations  History of Present Illness:    Kayla Sanders is a very pleasant 61 y.o. female with a hx of chest pain, palpitations, mild nonobstructive CAD, HLD, OSA on CPAP, and HTN.  Initially seen by Dr. Burt Knack in 2013 for chest pain and palpitations. Reported chest burning relieved with belching that occurs with rest and exertion.  Discomfort associated with walking described as pressure. Was under stress caring for her father and her children. History of GERD. Stress test normal 2013.  Returned for evaluation 01/06/2021 with Dr. Harrington Challenger.  She reported a different sensation of chest pain then previous. Now feels like "squeezing". Coronary CTA 01/2021 revealed coronary calcium score 3.36, 69th percentile for age/sex, normal coronary origin with right dominance, minimal less than 25% calcified plaque in the LAD, atypical pulmonary vein drainage into the left atrium.  Cardiac monitor 02/2021 revealed PACs and PVCs for which she started metoprolol.  Was feeling better on beta-blocker. Having problems with CPAP, was advised to follow-up with sleep medicine provider and return in 1 year for follow-up.  Today, she is here  alone for annual follow-up. Reports she is overall feeling well. Occasional palpitations that are much better controlled on beta blocker. Has occasional episodes of lightheadedness.  Contributes it to possible hypotension. Both daughters have POTS. BP is usually well below 130/80.  Has not been exercising on a consistent basis but just installed a treadmill in her home office so she can walk while working. No recent labs for me to review. Had lab work with primary Oct 2023 - was told everything was normal. She denies chest  pain, shortness of breath, lower extremity edema, fatigue, melena, hematuria, hemoptysis, diaphoresis, weakness, presyncope, syncope, orthopnea, and PND.   Past Medical History:  Diagnosis Date   Anxiety    Anxiety    Asthma    Complication of anesthesia    told by anesthesia had small airway   Family history of adverse reaction to anesthesia    daughter has severe ponv   Fibromyalgia    GERD (gastroesophageal reflux disease)    Hyperlipidemia    Hypertension    Insomnia    Migraines    Mitral valve prolapse    OSA (obstructive sleep apnea)    on CPAP   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 02/03/2021   PONV (postoperative nausea and vomiting)    with general anesthesia severe ponv, does ok with propofol   Vitamin D deficiency     Past Surgical History:  Procedure Laterality Date   24 HOUR [place] STUDY  12/10/2021   Procedure: 24 HOUR PH STUDY;  Surgeon: Mauri Pole, MD;  Location: [facility];  Service: Endoscopy;;   ABDOMINAL HYSTERECTOMY     partial   BIOPSY  06/12/2021   Procedure: BIOPSY;  Surgeon: Mauri Pole, MD;  Location: [facility];  Service: Endoscopy;;   BRAVO [place] STUDY N/A 02/06/2013   Procedure: BRAVO [place];  Surgeon: Juanita Craver, MD;  Location: [facility];  Service: Endoscopy;  Laterality: N/A;   CARPAL TUNNEL RELEASE Right    CHOLECYSTECTOMY     COLONOSCOPY WITH PROPOFOL N/A 10/09/2016   Procedure: COLONOSCOPY WITH PROPOFOL;  Surgeon: Carol Ada, MD;  Location: [facility];  Service: Endoscopy;  Laterality: N/A;   COLONOSCOPY WITH PROPOFOL N/A 06/12/2021   Procedure: COLONOSCOPY WITH PROPOFOL;  Surgeon: Mauri Pole, MD;  Location: [facility];  Service: Endoscopy;  Laterality: N/A;   colonscopy  2012   ESOPHAGEAL MANOMETRY N/A 12/10/2021   Procedure: ESOPHAGEAL MANOMETRY (EM);  Surgeon: Mauri Pole, MD;  Location: [facility];  Service: Endoscopy;  Laterality: N/A;   ESOPHAGOGASTRODUODENOSCOPY N/A  02/06/2013   Procedure: ESOPHAGOGASTRODUODENOSCOPY (EGD);  Surgeon: Juanita Craver, MD;  Location: [facility];  Service: Endoscopy;  Laterality: N/A;   ESOPHAGOGASTRODUODENOSCOPY (EGD) WITH PROPOFOL N/A 10/09/2016   Procedure: ESOPHAGOGASTRODUODENOSCOPY (EGD) WITH PROPOFOL;  Surgeon: Carol Ada, MD;  [facility]: [facility];  Service: Endoscopy;  Laterality: N/A;   ESOPHAGOGASTRODUODENOSCOPY (EGD) WITH PROPOFOL N/A 06/12/2021   Procedure: ESOPHAGOGASTRODUODENOSCOPY (EGD) WITH PROPOFOL;  Surgeon: Mauri Pole, MD;  [facility]: [facility];  Service: Endoscopy;  Laterality: N/A;   ESOPHAGOGASTRODUODENOSCOPY (EGD) WITH PROPOFOL N/A 12/25/2021   Procedure: ESOPHAGOGASTRODUODENOSCOPY (EGD) WITH PROPOFOL;  Surgeon: Mauri Pole, MD;  Location: [facility];  Service: Endoscopy;  Laterality: N/A;   HEMORRHOIDECTOMY WITH HEMORRHOID BANDING     HOT HEMOSTASIS N/A 06/12/2021   Procedure: HOT HEMOSTASIS (ARGON PLASMA COAGULATION/BICAP);  Surgeon: Mauri Pole, MD;  Location: [facility];  Service: Endoscopy;  Laterality: N/A;   NASAL SEPTUM SURGERY     [place] IMPEDANCE STUDY N/A 12/10/2021   Procedure: [place] IMPEDANCE STUDY;  Surgeon: Mauri Pole, MD;  Location: [facility];  Service: Endoscopy;  Laterality: N/A;   POLYPECTOMY  06/12/2021   Procedure: POLYPECTOMY;  Surgeon: Mauri Pole, MD;  Location: [facility];  Service: Endoscopy;;   RECTOCELE REPAIR     TMJ ARTHROPLASTY     and removal   TUBAL LIGATION     VESICOVAGINAL FISTULA CLOSURE W/ TAH      Current Medications: Current Meds  Medication Sig   acetaminophen (TYLENOL) 500 MG tablet Take 500 mg by mouth at bedtime.   albuterol (VENTOLIN HFA) 108 (90 Base) MCG/ACT inhaler Inhale 2 puffs into the lungs every 4 (four) hours as needed for shortness of breath.   ALPRAZolam (XANAX) 1 MG tablet Take 1 mg by mouth at bedtime.   Azelastine HCl 137 MCG/SPRAY SOLN Place 1 spray into both nostrils daily as needed for allergies.    carboxymethylcellulose (REFRESH PLUS) 0.5 % SOLN Place 1 drop into both eyes 3 (three) times daily as needed (dry eyes).   dexlansoprazole (DEXILANT) 60 MG capsule Take 1 capsule (60 mg total) by mouth daily.   DULoxetine HCl 30 MG CSDR Take 60 mg by mouth daily.   famotidine (PEPCID) 20 MG tablet Take 2 tablets (40 mg total) by mouth at bedtime as needed for heartburn or indigestion.   fexofenadine (ALLEGRA) 180 MG tablet Take 180 mg by mouth every evening.   fluconazole (DIFLUCAN) 100 MG tablet Take 100 mg by mouth daily as needed (yeast). For yeast at caesarean scar.   hydrocortisone-pramoxine (ANALPRAM-HC) 2.5-1 % rectal cream Place 1 application rectally daily as needed for hemorrhoids.   levothyroxine (SYNTHROID) 25 MCG tablet Take 25 mcg by mouth daily.   metoprolol succinate (TOPROL-XL) 25 MG 24 hr tablet TAKE 1 TABLET (25 MG TOTAL) BY MOUTH DAILY.   montelukast (SINGULAIR) 10 MG tablet Take 1 tablet (10 mg total) by mouth at bedtime.   nystatin cream (MYCOSTATIN) Apply 1 application topically 2 (two) times daily as needed (yeast).   olmesartan (BENICAR) 20  MG tablet Take 20 mg by mouth daily.   olopatadine (PATANOL) 0.1 % ophthalmic solution Place 1 drop into both eyes 2 (two) times daily as needed for allergies.   PRESCRIPTION MEDICATION Place 1 application vaginally 2 (two) times a week. Estradiol ellage 0.02%, 0.'2mg'$ /52m   Propylene Glycol (SYSTANE COMPLETE PF OP) Place 1-2 drops into both eyes 2 (two) times daily.   rosuvastatin (CRESTOR) 5 MG tablet TAKE 1 TABLET (5 MG TOTAL) BY MOUTH DAILY.   sucralfate (CARAFATE) 1 GM/10ML suspension Take 10 mLs (1 g total) by mouth 4 (four) times daily as needed.   valACYclovir (VALTREX) 500 MG tablet Take 500 mg by mouth daily.   Vitamin D, Ergocalciferol, (DRISDOL) 1.25 MG (50000 UNIT) CAPS capsule Take 50,000 Units by mouth every Wednesday.     Allergies:   Pseudoephedrine hcl, Fentanyl, Levofloxacin, and Ephedrine   Social History    Socioeconomic History   Marital status: Married    Spouse name: Not on file   Number of children: 3   Years of education: BS   Highest education level: Not on file  Occupational History   Occupation: [employer]  Tobacco Use   Smoking status: Former    Types: Cigarettes    Quit date: 11/23/1981    Years since quitting: 40.9   Smokeless tobacco: Never  Vaping Use   Vaping Use: Never used  Substance and Sexual Activity   Alcohol use: Never    Comment: Rare, then "zero for ages"   Drug use: No   Sexual activity: Yes  Other Topics Concern   Not on file  Social History Narrative   Married with grown children. 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 No Etoh.   Consumes about 3+ caffeine drinks a day    Social Determinants of HRadio broadcast assistantStrain: Not on file  Food Insecurity: Not on file  Transportation Needs: Not on file  Physical Activity: Not on file  Stress: Not on file  Social Connections: Not on file     Family History: The patient's family history includes Arthritis in her father, mother, and another family member; Atrial fibrillation in her brother; Breast cancer in her paternal aunt and another family member; Colon cancer (age of onset: 853 in her mother; Colon polyps in her brother and brother; Diabetes in an other family member; Heart disease in her father; Hyperlipidemia in her father and mother; Hypertension in her father and mother; Lung cancer in her maternal aunt and mother; Osteopenia in her father and mother; Osteoporosis in her maternal aunt; Pancreatic cancer in her maternal uncle; Severe combined immunodeficiency in her maternal aunt; Sleep apnea in her brother, father, and mother; Stroke in her father. There is no history of Liver disease, Esophageal cancer, Stomach cancer, or Rectal cancer.  ROS:   Please see the history of present illness.    + occasional lightheadedness + occasional palpitations All other systems  reviewed and are negative.  Labs/Other Studies Reviewed:    The following studies were reviewed today:  Cardiac Monitor 03/08/21 Heart rates 52 to 174 bpm   Average HR 78 bpm.  Predominent rhythm is sinus   There are occasional PVCs, PACs.   Occasional short bursts fo SVT   Longest at 8 beats at 106 bpm Symptoms of palpitations occasionally correlated with PVCs, short burst of SVT   Occasionally correlated with SR Symptoms of chest pressure occasionally correlated with SR  Occasionally correlated with SR with PVC.  No pauses    CCTA 01/24/21 1. Coronary calcium score of 3.36. This was 69th percentile for age and sex matched control.   2. Normal coronary origin with right dominance.   3. Minimal (<25%) calcified plaque int he LAD.  CAD-RADS 1.   4. 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 There are four right pulmonary veins and two on the left.    Recent Labs: No results found for requested labs within last 365 days.  Recent Lipid Panel    Component Value Date/Time   CHOL 153 04/15/2021 0833   TRIG 81 04/15/2021 0833   HDL 63 04/15/2021 0833   CHOLHDL 2.4 04/15/2021 0833   CHOLHDL 3 09/05/2013 1107   VLDL 20.0 09/05/2013 1107   LDLCALC 75 04/15/2021 0833     Risk Assessment/Calculations:      Physical Exam:    VS:  BP 100/64 (BP Location: Right Arm, Patient Position: Sitting, Cuff Size: Normal)   Pulse 67   Ht '5\' 1"'$  (1.549 m)   Wt 160 lb (72.6 kg)   SpO2 97%   BMI 30.23 kg/m     Wt Readings from Last 3 Encounters:  10/27/22 160 lb (72.6 kg)  08/19/22 161 lb 9.6 oz (73.3 kg)  08/12/22 161 lb (73 kg)     GEN:  Well nourished, well developed in no acute distress HEENT: Normal NECK: No JVD; No carotid bruits CARDIAC: RRR, no murmurs, rubs, gallops RESPIRATORY:  Clear to auscultation without rales, wheezing or rhonchi  ABDOMEN: Soft, non-tender, non-distended MUSCULOSKELETAL:  No edema; No deformity. 2+ pedal pulses, equal bilaterally SKIN: Warm  and dry NEUROLOGIC:  Alert and oriented x 3 PSYCHIATRIC:  Normal affect   EKG:  EKG is ordered today.  The ekg ordered today demonstrates normal sinus rhythm at 67 bpm, no ST abnormality     Diagnoses:    1. Essential hypertension   2. Palpitations   3. Hyperlipidemia LDL goal <70   4. Coronary artery disease involving native coronary artery of native heart without angina pectoris    Assessment and Plan:     CAD without angina: CCTA 01/2021 with coronary calcium score of 3.36  (69th % for age/sex) with minimal < 25% calcified plaque in LAD. She denies chest pain, dyspnea, or other symptoms concerning for angina.  No indication for further ischemic evaluation at this time. We discussed secondary prevention including heart healthy diet, moderate intensity exercise 150 minutes/week, good BP and cholesterol control. Planning to increase exercise frequency.   Palpitations: Occasional, much better on metoprolol.   Hypertension: BP is well-controlled, somewhat soft.  Not interfering with her daily activities. Advised her to notify us if symptoms of lightheadedness worsen. Continue low dose Benicar and metoprolol.   Hyperlipidemia LDL goal < 70: LDL 75 on 04/15/2021.  Continue rosuvastatin.     Disposition: 1 year with Dr. Harrington Challenger  Medication Adjustments/Labs and Tests Ordered: Current medicines are reviewed at length with the patient today.  Concerns regarding medicines are outlined above.  Orders Placed This Encounter  Procedures   EKG 12-Lead   No orders of the defined types were placed in this encounter.   Patient Instructions  Medication Instructions:  Your physician recommends that you continue on your current medications as directed. Please refer to the Current Medication list given to you today.  *If you need a refill on your cardiac medications before your next appointment, please call your pharmacy*   Lab Work: None ordered   If you have  labs (blood work) drawn today and  your tests are completely normal, you will receive your results only by: [place] (if you have MyChart) OR A paper copy in the mail If you have any lab test that is abnormal or we need to change your treatment, we will call you to review the results.   Testing/Procedures: None ordered    Follow-Up: At [facility], you and your health needs are our priority.  As part of our continuing mission to provide you with exceptional heart care, we have created designated Provider Care Teams.  These Care Teams include your primary Cardiologist (physician) and Advanced Practice Providers (APPs -  Physician Assistants and Nurse Practitioners) who all work together to provide you with the care you need, when you need it.  We recommend signing up for the patient portal called "MyChart".  Sign up information is provided on this After Visit Summary.  MyChart is used to connect with patients for Virtual Visits (Telemedicine).  Patients are able to view lab/test results, encounter notes, upcoming appointments, etc.  Non-urgent messages can be sent to your provider as well.   To learn more about what you can do with MyChart, go to [email or web address].    Your next appointment:   12 month(s)  The format for your next appointment:   In Person  Provider:   Dr. Dorris Carnes   Other Instructions   Important Information About Sugar         Signed, Emmaline Life, NP  10/28/2022 6:46 AM    [place]

## 2022-10-27 ENCOUNTER — Encounter: Payer: Self-pay | Admitting: Nurse Practitioner

## 2022-10-27 ENCOUNTER — Ambulatory Visit: Payer: 59 | Attending: Nurse Practitioner | Admitting: Nurse Practitioner

## 2022-10-27 VITALS — BP 100/64 | HR 67 | Ht 61.0 in | Wt 160.0 lb

## 2022-10-27 DIAGNOSIS — R002 Palpitations: Secondary | ICD-10-CM | POA: Diagnosis not present

## 2022-10-27 DIAGNOSIS — E785 Hyperlipidemia, unspecified: Secondary | ICD-10-CM | POA: Diagnosis not present

## 2022-10-27 DIAGNOSIS — I2510 Atherosclerotic heart disease of native coronary artery without angina pectoris: Secondary | ICD-10-CM | POA: Diagnosis not present

## 2022-10-27 DIAGNOSIS — I10 Essential (primary) hypertension: Secondary | ICD-10-CM

## 2022-10-27 NOTE — Patient Instructions (Addendum)
Medication Instructions:  Your physician recommends that you continue on your current medications as directed. Please refer to the Current Medication list given to you today.  *If you need a refill on your cardiac medications before your next appointment, please call your pharmacy*   Lab Work: None ordered   If you have labs (blood work) drawn today and your tests are completely normal, you will receive your results only by: [place] (if you have MyChart) OR A paper copy in the mail If you have any lab test that is abnormal or we need to change your treatment, we will call you to review the results.   Testing/Procedures: None ordered    Follow-Up: At [facility], you and your health needs are our priority.  As part of our continuing mission to provide you with exceptional heart care, we have created designated Provider Care Teams.  These Care Teams include your primary Cardiologist (physician) and Advanced Practice Providers (APPs -  Physician Assistants and Nurse Practitioners) who all work together to provide you with the care you need, when you need it.  We recommend signing up for the patient portal called "MyChart".  Sign up information is provided on this After Visit Summary.  MyChart is used to connect with patients for Virtual Visits (Telemedicine).  Patients are able to view lab/test results, encounter notes, upcoming appointments, etc.  Non-urgent messages can be sent to your provider as well.   To learn more about what you can do with MyChart, go to [email or web address].    Your next appointment:   12 month(s)  The format for your next appointment:   In Person  Provider:   Dr. Dorris Carnes   Other Instructions   Important Information About Sugar

## 2022-10-28 ENCOUNTER — Encounter: Payer: Self-pay | Admitting: Nurse Practitioner

## 2022-11-24 NOTE — Telephone Encounter (Signed)
Repeat download over the past 30 days shows improvement of residual AHI at 4.3 at new pressure setting of 18.  Leaks minimal at 14.8 in the 95th percentile. No need for further changes at this time.

## 2022-12-22 ENCOUNTER — Other Ambulatory Visit (HOSPITAL_COMMUNITY): Payer: Self-pay

## 2022-12-22 MED ORDER — VILAZODONE HCL 40 MG PO TABS
ORAL_TABLET | ORAL | 2 refills | Status: DC
  Filled 2022-12-22: qty 30, 31d supply, fill #0

## 2023-03-08 DIAGNOSIS — M7541 Impingement syndrome of right shoulder: Secondary | ICD-10-CM | POA: Insufficient documentation

## 2023-03-12 ENCOUNTER — Encounter: Payer: Self-pay | Admitting: Physical Medicine & Rehabilitation

## 2023-03-16 DIAGNOSIS — G5620 Lesion of ulnar nerve, unspecified upper limb: Secondary | ICD-10-CM | POA: Insufficient documentation

## 2023-03-23 ENCOUNTER — Encounter: Payer: Self-pay | Admitting: Physical Medicine & Rehabilitation

## 2023-03-23 ENCOUNTER — Encounter: Payer: 59 | Attending: Physical Medicine & Rehabilitation | Admitting: Physical Medicine & Rehabilitation

## 2023-03-23 VITALS — BP 119/77 | HR 84 | Ht 61.0 in | Wt 173.6 lb

## 2023-03-23 DIAGNOSIS — G8929 Other chronic pain: Secondary | ICD-10-CM | POA: Diagnosis present

## 2023-03-23 DIAGNOSIS — R200 Anesthesia of skin: Secondary | ICD-10-CM | POA: Diagnosis not present

## 2023-03-23 DIAGNOSIS — M357 Hypermobility syndrome: Secondary | ICD-10-CM | POA: Diagnosis not present

## 2023-03-23 DIAGNOSIS — M25511 Pain in right shoulder: Secondary | ICD-10-CM | POA: Diagnosis not present

## 2023-03-23 DIAGNOSIS — M25512 Pain in left shoulder: Secondary | ICD-10-CM | POA: Insufficient documentation

## 2023-03-23 DIAGNOSIS — F419 Anxiety disorder, unspecified: Secondary | ICD-10-CM | POA: Diagnosis present

## 2023-03-23 DIAGNOSIS — M797 Fibromyalgia: Secondary | ICD-10-CM | POA: Insufficient documentation

## 2023-03-23 NOTE — Progress Notes (Deleted)
Hx of Ehler's Danlos Depression, anxiety PTSD -Denies SI or HI -Continue f/u with [facility] Sortisone L shoulder ? Cubital tunnel  Stomach ulcers- cannot take NSAIDS Voltaren gel tried ortho   Xray C spine 02/17/21 EXAMINATION: MRI cervical spine   CLINICAL INDICATION:Neck pain, headaches   TECHNIQUE: MRI cervical spine without contrast.   COMPARISON:None   FINDINGS:   The bone marrow signal is normal.   The craniocervical junction is normal.   The cervical spinal cord is normal.   C2-C3: Normal   C3-C4: The disc is normal. There is mild facet arthropathy on the right.   C4-C5: The disc is normal. There is facet arthropathy on the left.   C5-C6: There is a mild disc bulge. Facet joints are normal.   C6-C7: There is a mild disc bulge. Facet joints are normal   C7-T1: Normal

## 2023-03-23 NOTE — Progress Notes (Signed)
Subjective:    Patient ID: Kayla Sanders, female    DOB: 07-11-1961, 62 y.o.   MRN: [id number]  HPI   Kayla Sanders is a 62 y.o. year old female  who  has a past medical history of Anxiety, Anxiety, Asthma, Complication of anesthesia, Family history of adverse reaction to anesthesia, Fibromyalgia, GERD (gastroesophageal reflux disease), Hyperlipidemia, Hypertension, Insomnia, Migraines, Mitral valve prolapse, OSA (obstructive sleep apnea), 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 (02/03/2021), PONV (postoperative nausea and vomiting), and Vitamin D deficiency.   They are presenting to PM&R clinic as a new patient for pain management evaluation.  She is referred for evaluation for her fibromyalgia pain.  She says she was diagnosed many years ago in the 90s with fibromyalgia by rheumatology.  She also has suspected history of Ehlers-Danlos syndrome.  She has never been formally diagnosed but she has daughters with this condition.  She has chronic severe pain throughout her entire body.  She also has a history of shoulder pain bilaterally.  She says she has been seen by orthopedics and they are concerned of a right rotator cuff disorder.  She had an MRI completed of her right shoulder and is waiting on the results.  She also has been noted to have numbness in her digits 4 and 5 in her bilateral hands.  She has been started on nighttime extension bracing for suspected cubital tunnel syndrome.  She had prior right carpal tunnel release in the 90s, continues to have some wrist pain but numbness is much improved.  She continues to suffer with depression anxiety and PTSD.  No SI or HI.  She is followed by mental health.  She was previously tried on duloxetine which initially helped her pain significantly.  Analgesia wore off over time and she had side effects with this medication.  She had withdrawal that was very difficult trying to get off the duloxetine.  She is not currently on  any antidepressant medications, mental health is considering s ketamin treatment.  She has recently been working with physical therapy and this has been helping to improve her pain.  Red flag symptoms: No red flags for back pain endorsed in Hx or ROS  Medications tried: Topical medications - has not tried Nsaids - Cannot take due to GI, stomach cultures Tylenol  - Helps at night Opiates   Fentanyl-surgery, did not tolerate Codeine, oxycodone, hydrocodone- caused a lot of side effects, she can take for very short time Gabapentin- Lyrica can't didn't tolerate but can't remember why Gabantin- concerned about brain fog TCAs  - has not tried  SNRIs  duloxetine- side effects Xanax at night for sleep Considering ketamine for pain   Other treatments: PT/OT - Currently working with PT, helping TENs unit - hasn't tried Injections -shoulder injections helped at first, stopped working Surgery - CTS release on right    Prior UDS results: No results found for: "LABOPIA", "COCAINSCRNUR", "LABBENZ", "AMPHETMU", "THCU", "LABBARB"    Pain Inventory Average Pain 3 Pain Right Now 3 My pain is aching  In the last 24 hours, has pain interfered with the following? General activity 5 Relation with others 5 Enjoyment of life 7 What TIME of day is your pain at its worst? morning , daytime, evening, and night Sleep (in general) Poor  Pain is worse with: unsure Pain improves with: therapy/exercise Relief from Meds: 3  walk without assistance ability to climb steps?  yes do you drive?  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  no  Any changes since last visit?  no    Family History  Problem Relation Age of Onset   Arthritis Mother    Hyperlipidemia Mother    Hypertension Mother    Sleep apnea Mother    Colon  cancer Mother 25   Lung cancer Mother    Osteopenia Mother    Arthritis Father    Hyperlipidemia Father    Heart disease Father    Stroke Father    Hypertension Father    Sleep apnea Father    Osteopenia Father    Colon polyps Brother    Sleep apnea Brother    Colon polyps Brother    Atrial fibrillation Brother    Lung cancer Maternal Aunt    Osteoporosis Maternal Aunt    Severe combined immunodeficiency Maternal Aunt    Pancreatic cancer Maternal Uncle    Breast cancer Paternal Aunt    Arthritis Other    Breast cancer Other    Diabetes Other    Liver disease Neg Hx    Esophageal cancer Neg Hx    Stomach cancer Neg Hx    Rectal cancer Neg Hx    Social History   Socioeconomic History   Marital status: Married    Spouse name: Not on file   Number of children: 3   Years of education: BS   Highest education level: Not on file  Occupational History   Occupation: [employer]  Tobacco Use   Smoking status: Former    Types: Cigarettes    Quit date: 11/23/1981    Years since quitting: 41.3   Smokeless tobacco: Never  Vaping Use   Vaping Use: Never used  Substance and Sexual Activity   Alcohol use: Never    Comment: Rare, then "zero for ages"   Drug use: No   Sexual activity: Yes  Other Topics Concern   Not on file  Social History Narrative   Married with grown children. Works for [employer] as a Nurse, children's. Nonsmoker, quit in 1983. No Etoh.   Consumes about 3+ caffeine drinks a day    Social Determinants of Health   Financial Resource Strain: Not on file  Food Insecurity: Not on file  Transportation Needs: Not on file  Physical Activity: Not on file  Stress: Not on file  Social Connections: Not on file   Past Surgical History:  Procedure Laterality Date   37 HOUR PH STUDY  12/10/2021   Procedure: 24 HOUR PH STUDY;  Surgeon: Napoleon Form, MD;  Location: [facility];  Service: Endoscopy;;   ABDOMINAL HYSTERECTOMY     partial   BIOPSY  06/12/2021    Procedure: BIOPSY;  Surgeon: Napoleon Form, MD;  Location: [facility];  Service: Endoscopy;;   BRAVO PH STUDY N/A 02/06/2013   Procedure: BRAVO PH STUDY;  Surgeon: Charna Elizabeth, MD;  Location: [facility];  Service: Endoscopy;  Laterality: N/A;   CARPAL TUNNEL RELEASE Right    CHOLECYSTECTOMY     COLONOSCOPY WITH PROPOFOL N/A 10/09/2016   Procedure: COLONOSCOPY WITH PROPOFOL;  Surgeon: Jeani Hawking, MD;  Location: [facility];  Service: Endoscopy;  Laterality: N/A;   COLONOSCOPY WITH PROPOFOL N/A 06/12/2021   Procedure: COLONOSCOPY WITH PROPOFOL;  Surgeon: Napoleon Form, MD;  Location: [facility];  Service: Endoscopy;  Laterality: N/A;  colonscopy  2012   ESOPHAGEAL MANOMETRY N/A 12/10/2021   Procedure: ESOPHAGEAL MANOMETRY (EM);  Surgeon: Napoleon Form, MD;  Location: [facility];  Service: Endoscopy;  Laterality: N/A;   ESOPHAGOGASTRODUODENOSCOPY N/A 02/06/2013   Procedure: ESOPHAGOGASTRODUODENOSCOPY (EGD);  Surgeon: Charna Elizabeth, MD;  Location: [facility];  Service: Endoscopy;  Laterality: N/A;   ESOPHAGOGASTRODUODENOSCOPY (EGD) WITH PROPOFOL N/A 10/09/2016   Procedure: ESOPHAGOGASTRODUODENOSCOPY (EGD) WITH PROPOFOL;  Surgeon: Jeani Hawking, MD;  Location: [facility];  Service: Endoscopy;  Laterality: N/A;   ESOPHAGOGASTRODUODENOSCOPY (EGD) WITH PROPOFOL N/A 06/12/2021   Procedure: ESOPHAGOGASTRODUODENOSCOPY (EGD) WITH PROPOFOL;  Surgeon: Napoleon Form, MD;  Location: [facility];  Service: Endoscopy;  Laterality: N/A;   ESOPHAGOGASTRODUODENOSCOPY (EGD) WITH PROPOFOL N/A 12/25/2021   Procedure: ESOPHAGOGASTRODUODENOSCOPY (EGD) WITH PROPOFOL;  Surgeon: Napoleon Form, MD;  Location: [facility];  Service: Endoscopy;  Laterality: N/A;   HEMORRHOIDECTOMY WITH HEMORRHOID BANDING     HOT HEMOSTASIS N/A 06/12/2021   Procedure: HOT HEMOSTASIS (ARGON PLASMA COAGULATION/BICAP);  Surgeon: Napoleon Form, MD;  Location: [facility];  Service: Endoscopy;   Laterality: N/A;   NASAL SEPTUM SURGERY     PH IMPEDANCE STUDY N/A 12/10/2021   Procedure: PH IMPEDANCE STUDY;  Surgeon: Napoleon Form, MD;  Location: [facility];  Service: Endoscopy;  Laterality: N/A;   POLYPECTOMY  06/12/2021   Procedure: POLYPECTOMY;  Surgeon: Napoleon Form, MD;  Location: [facility];  Service: Endoscopy;;   RECTOCELE REPAIR     TMJ ARTHROPLASTY     and removal   TUBAL LIGATION     VESICOVAGINAL FISTULA CLOSURE W/ TAH     Past Medical History:  Diagnosis Date   Anxiety    Anxiety    Asthma    Complication of anesthesia    told by anesthesia had small airway   Family history of adverse reaction to anesthesia    daughter has severe ponv   Fibromyalgia    GERD (gastroesophageal reflux disease)    Hyperlipidemia    Hypertension    Insomnia    Migraines    Mitral valve prolapse    OSA (obstructive sleep apnea)    on CPAP   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 02/03/2021   PONV (postoperative nausea and vomiting)    with general anesthesia severe ponv, does ok with propofol   Vitamin D deficiency    BP 119/77   Pulse 84   Ht 5\' 1"  (1.549 m)   Wt 173 lb 9.6 oz (78.7 kg)   SpO2 97%   BMI 32.80 kg/m  Original BP 142/83--rechecked Opioid Risk Score:   Fall Risk Score:  `1  Depression screen [facility] 2/9     03/23/2023   10:13 AM 09/05/2013   10:40 AM  Depression screen PHQ 2/9  Decreased Interest 2 0  Down, Depressed, Hopeless 2 0  PHQ - 2 Score 4 0  Altered sleeping 3   Tired, decreased energy 3   Change in appetite 2   Feeling bad or failure about yourself  2   Trouble concentrating 1   Moving slowly or fidgety/restless 0   Suicidal thoughts 0   PHQ-9 Score 15   Difficult doing work/chores Very difficult      Review of Systems  Constitutional:  Positive for unexpected weight change.       Wt gain  HENT: Negative.    Eyes: Negative.   Respiratory: Negative.    Cardiovascular: Negative.    Gastrointestinal: Negative.   Endocrine: Negative.  Genitourinary:  Positive for urgency.  Musculoskeletal:  Positive for gait problem and myalgias.  Skin: Negative.   Allergic/Immunologic: Negative.   Neurological:  Positive for weakness and numbness.  Hematological: Negative.   Psychiatric/Behavioral:  Positive for dysphoric mood. The patient is nervous/anxious.   All other systems reviewed and are negative.      Objective:   Physical Exam Gen: no distress, normal appearing HEENT: oral mucosa pink and moist, NCAT Cardio: Reg rate Chest: normal effort, normal rate of breathing Abd: soft, non-distended Ext: no edema Psych: pleasant, normal affect Skin: intact Neuro: alert and awake Follows commands, CN 2-12 grossly intact, normal speech and languate DTR normal and symmetric  Hoffman's negative Tinels positive b/l elbow, neg at b/l wrist Alerted sensation b/l 4th and fifth digits No ankle clonus Musculoskeletal: Slump test negative Tender throughout all tested points in b/l UE and LE Spurlings test negative Shoulder pain with internal rotation on L, external rotation on R Neers- mildly positive on R Mildly increased joint laxity, she is not able to extend thumb all the way to her wrist but says she could previously    Xray C spine 02/17/21 EXAMINATION: MRI cervical spine   CLINICAL INDICATION:Neck pain, headaches   TECHNIQUE: MRI cervical spine without contrast.   COMPARISON:None   FINDINGS:   The bone marrow signal is normal.   The craniocervical junction is normal.   The cervical spinal cord is normal.   C2-C3: Normal   C3-C4: The disc is normal. There is mild facet arthropathy on the right.   C4-C5: The disc is normal. There is facet arthropathy on the left.   C5-C6: There is a mild disc bulge. Facet joints are normal.   C6-C7: There is a mild disc bulge. Facet joints are normal   C7-T1: Normal     Assessment & Plan:   Fibromyalgia   -Intolerance to multiple prior medications -May consider low dose naltrexone, savella at a later time, she would like to hold off until completing ketamine treatment -Poor tolerance of cymbalta in the past, it did help pain initially  -Order Tens unit, Zynex and cryoheat device, discussed precautions with this -List of foods for pain discussed  -Continue PT, consider Aquatherapy - She plans to try S-ketamine treatment and we discussed options, defer any medication changes until after this is attempted  Hypermobility syndrome, appears more mild  -joint protection techniques   B/L shoulder pain -OA vs Rotator cuff impingement  -Follows with Ortho, Reports MRI R shoulder pending  B/L 4th and 5th digit numbness -Tinels positive elbow, she has been started on elbow splint at night -Consider EMG study if worsens  Hx of CTS right -She reports symptoms improved after surgery  Depression, anxiety PTSD -Denies SI or HI -Continue f/u with MH

## 2023-03-26 ENCOUNTER — Encounter: Payer: Self-pay | Admitting: Physical Medicine & Rehabilitation

## 2023-04-09 ENCOUNTER — Encounter: Payer: 59 | Admitting: Physical Medicine & Rehabilitation

## 2023-05-14 ENCOUNTER — Ambulatory Visit: Payer: 59 | Admitting: Physical Medicine & Rehabilitation

## 2023-05-17 ENCOUNTER — Ambulatory Visit: Payer: 59 | Admitting: Physical Medicine & Rehabilitation

## 2023-05-21 ENCOUNTER — Ambulatory Visit: Payer: 59 | Admitting: Physical Medicine & Rehabilitation

## 2023-06-07 ENCOUNTER — Encounter: Payer: 59 | Admitting: Physical Medicine & Rehabilitation

## 2023-07-09 ENCOUNTER — Encounter: Payer: 59 | Attending: Physical Medicine & Rehabilitation | Admitting: Physical Medicine & Rehabilitation

## 2023-07-09 ENCOUNTER — Encounter: Payer: Self-pay | Admitting: Physical Medicine & Rehabilitation

## 2023-07-09 VITALS — BP 112/73 | HR 76 | Ht 61.0 in | Wt 175.0 lb

## 2023-07-09 DIAGNOSIS — M797 Fibromyalgia: Secondary | ICD-10-CM | POA: Diagnosis not present

## 2023-07-09 DIAGNOSIS — R200 Anesthesia of skin: Secondary | ICD-10-CM | POA: Insufficient documentation

## 2023-07-09 DIAGNOSIS — M357 Hypermobility syndrome: Secondary | ICD-10-CM | POA: Diagnosis not present

## 2023-07-09 MED ORDER — DICLOFENAC SODIUM 1 % EX GEL: 2.0000 g | Freq: Four times a day (QID) | CUTANEOUS | 3 refills | Status: AC

## 2023-07-09 NOTE — Progress Notes (Signed)
Subjective:    Patient ID: Kayla Sanders, female    DOB: May 03, 1961, 62 y.o.   MRN: [id number]  HPI   Kayla Sanders is a 62 y.o. year old female  who  has a past medical history of Anxiety, Anxiety, Asthma, Complication of anesthesia, Family history of adverse reaction to anesthesia, Fibromyalgia, GERD (gastroesophageal reflux disease), Hyperlipidemia, Hypertension, Insomnia, Migraines, Mitral valve prolapse, OSA (obstructive sleep apnea), 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 (02/03/2021), PONV (postoperative nausea and vomiting), and Vitamin D deficiency.   They are presenting to PM&R clinic as a new patient for pain management evaluation.  She is referred for evaluation for her fibromyalgia pain.  She says she was diagnosed many years ago in the 90s with fibromyalgia by rheumatology.  She also has suspected history of Ehlers-Danlos syndrome.  She has never been formally diagnosed but she has daughters with this condition.  She has chronic severe pain throughout her entire body.  She also has a history of shoulder pain bilaterally.  She says she has been seen by orthopedics and they are concerned of a right rotator cuff disorder.  She had an MRI completed of her right shoulder and is waiting on the results.  She also has been noted to have numbness in her digits 4 and 5 in her bilateral hands.  She has been started on nighttime extension bracing for suspected cubital tunnel syndrome.  She had prior right carpal tunnel release in the 90s, continues to have some wrist pain but numbness is much improved.  She continues to suffer with depression anxiety and PTSD.  No SI or HI.  She is followed by mental health.  She was previously tried on duloxetine which initially helped her pain significantly.  Analgesia wore off over time and she had side effects with this medication.  She had withdrawal that was very difficult trying to get off the duloxetine.  She is not currently on  any antidepressant medications, mental health is considering s ketamin treatment.  She has recently been working with physical therapy and this has been helping to improve her pain.  Red flag symptoms: No red flags for back pain endorsed in Hx or ROS  Medications tried: Topical medications - has not tried Nsaids - Cannot take due to GI, stomach cultures Tylenol  - Helps at night Opiates   Fentanyl-surgery, did not tolerate Codeine, oxycodone, hydrocodone- caused a lot of side effects, she can take for very short time Gabapentin- Lyrica can't didn't tolerate but can't remember why Gabantin- concerned about brain fog TCAs  - has not tried  SNRIs  duloxetine- side effects, she reports withdrawal she she stopped this Xanax at night for sleep Considering ketamine for pain   Other treatments: PT/OT - Currently working with PT, helping TENs unit - hasn't tried Injections -shoulder injections helped at first, stopped working Surgery - CTS release on right    Prior UDS results: No results found for: "LABOPIA", "COCAINSCRNUR", "LABBENZ", "AMPHETMU", "THCU", "LABBARB"    07/09/23 interval history Kayla Sanders is here for follow-up regarding her chronic body wide pain.  She reports she has not heard from [employer] about nexwave. She is planning to try spravato treatment this September.  She had to discontinue her physical therapy due to her busy schedule at work.  She would consider trying aqua therapy at a later time when her work schedule improves.  Patient reports that she had withdrawal when she stopped her Cymbalta previously and that is  why her pain was worse last visit.  When she was taking Cymbalta that she was having nightmares and other side effects that prevented her from continuing it.  She would like to avoid additional medications until Spravato treatment is attempted.  Patient reports she has seen a hand doctor and has MRI planned to evaluate for cubital tunnel.  Pain  Inventory Average Pain 3 Pain Right Now 3 My pain is constant, tingling, and aching  In the last 24 hours, has pain interfered with the following? General activity 3 Relation with others 3 Enjoyment of life 3 What TIME of day is your pain at its worst? morning , evening, and night Sleep (in general) Poor  Pain is worse with: inactivity and some activites Pain improves with: therapy/exercise and medication Relief from Meds: 5  walk without assistance ability to climb steps?  yes do you drive?  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  no  Any changes since last visit?  no    Family History  Problem Relation Age of Onset   Arthritis Mother    Hyperlipidemia Mother    Hypertension Mother    Sleep apnea Mother    Colon cancer Mother 46   Lung cancer Mother    Osteopenia Mother    Arthritis Father    Hyperlipidemia Father    Heart disease Father    Stroke Father    Hypertension Father    Sleep apnea Father    Osteopenia Father    Colon polyps Brother    Sleep apnea Brother    Colon polyps Brother    Atrial fibrillation Brother    Lung cancer Maternal Aunt    Osteoporosis Maternal Aunt    Severe combined immunodeficiency Maternal Aunt    Pancreatic cancer Maternal Uncle    Breast cancer Paternal Aunt    Arthritis Other    Breast cancer Other    Diabetes Other    Liver disease Neg Hx    Esophageal cancer Neg Hx    Stomach cancer Neg Hx    Rectal cancer Neg Hx    Social History   Socioeconomic History   Marital status: Married    Spouse name: Not on file   Number of children: 3   Years of education: BS   Highest education level: Not on file  Occupational History   Occupation: [employer]  Tobacco Use   Smoking status: Former    Current packs/day: 0.00    Types: Cigarettes    Quit  date: 11/23/1981    Years since quitting: 41.6   Smokeless tobacco: Never  Vaping Use   Vaping status: Never Used  Substance and Sexual Activity   Alcohol use: Never    Comment: Rare, then "zero for ages"   Drug use: No   Sexual activity: Yes  Other Topics Concern   Not on file  Social History Narrative   Married with grown children. Works for [employer] as a Nurse, children's. Nonsmoker, quit in 1983. No Etoh.   Consumes about 3+ caffeine drinks a day    Social Determinants of Health   Financial Resource Strain: Not on file  Food Insecurity: Not on file  Transportation Needs: Not on file  Physical Activity: Not on file  Stress: Not on file  Social Connections: Unknown (04/05/2022)   [facility], [facility]   Social Network    Social Network: Not on file   Past Surgical History:  Procedure Laterality Date   27 HOUR PH STUDY  12/10/2021   Procedure: 24 HOUR PH STUDY;  Surgeon: Napoleon Form, MD;  Location: [facility];  Service: Endoscopy;;   ABDOMINAL HYSTERECTOMY     partial   BIOPSY  06/12/2021   Procedure: BIOPSY;  Surgeon: Napoleon Form, MD;  Location: [facility];  Service: Endoscopy;;   BRAVO PH STUDY N/A 02/06/2013   Procedure: BRAVO PH STUDY;  Surgeon: Charna Elizabeth, MD;  Location: [facility];  Service: Endoscopy;  Laterality: N/A;   CARPAL TUNNEL RELEASE Right    CHOLECYSTECTOMY     COLONOSCOPY WITH PROPOFOL N/A 10/09/2016   Procedure: COLONOSCOPY WITH PROPOFOL;  Surgeon: Jeani Hawking, MD;  Location: [facility];  Service: Endoscopy;  Laterality: N/A;   COLONOSCOPY WITH PROPOFOL N/A 06/12/2021   Procedure: COLONOSCOPY WITH PROPOFOL;  Surgeon: Napoleon Form, MD;  Location: [facility];  Service: Endoscopy;  Laterality: N/A;   colonscopy  2012   ESOPHAGEAL MANOMETRY N/A 12/10/2021   Procedure: ESOPHAGEAL MANOMETRY (EM);  Surgeon: Napoleon Form, MD;  Location: [facility];  Service: Endoscopy;  Laterality: N/A;    ESOPHAGOGASTRODUODENOSCOPY N/A 02/06/2013   Procedure: ESOPHAGOGASTRODUODENOSCOPY (EGD);  Surgeon: Charna Elizabeth, MD;  Location: [facility];  Service: Endoscopy;  Laterality: N/A;   ESOPHAGOGASTRODUODENOSCOPY (EGD) WITH PROPOFOL N/A 10/09/2016   Procedure: ESOPHAGOGASTRODUODENOSCOPY (EGD) WITH PROPOFOL;  Surgeon: Jeani Hawking, MD;  Location: [facility];  Service: Endoscopy;  Laterality: N/A;   ESOPHAGOGASTRODUODENOSCOPY (EGD) WITH PROPOFOL N/A 06/12/2021   Procedure: ESOPHAGOGASTRODUODENOSCOPY (EGD) WITH PROPOFOL;  Surgeon: Napoleon Form, MD;  Location: [facility];  Service: Endoscopy;  Laterality: N/A;   ESOPHAGOGASTRODUODENOSCOPY (EGD) WITH PROPOFOL N/A 12/25/2021   Procedure: ESOPHAGOGASTRODUODENOSCOPY (EGD) WITH PROPOFOL;  Surgeon: Napoleon Form, MD;  Location: [facility];  Service: Endoscopy;  Laterality: N/A;   HEMORRHOIDECTOMY WITH HEMORRHOID BANDING     HOT HEMOSTASIS N/A 06/12/2021   Procedure: HOT HEMOSTASIS (ARGON PLASMA COAGULATION/BICAP);  Surgeon: Napoleon Form, MD;  Location: [facility];  Service: Endoscopy;  Laterality: N/A;   NASAL SEPTUM SURGERY     PH IMPEDANCE STUDY N/A 12/10/2021   Procedure: PH IMPEDANCE STUDY;  Surgeon: Napoleon Form, MD;  Location: [facility];  Service: Endoscopy;  Laterality: N/A;   POLYPECTOMY  06/12/2021   Procedure: POLYPECTOMY;  Surgeon: Napoleon Form, MD;  Location: [facility];  Service: Endoscopy;;   RECTOCELE REPAIR     TMJ ARTHROPLASTY     and removal   TUBAL LIGATION     VESICOVAGINAL FISTULA CLOSURE W/ TAH     Past Medical History:  Diagnosis Date   Anxiety    Anxiety    Asthma    Complication of anesthesia    told by anesthesia had small airway   Family history of adverse reaction to anesthesia    daughter has severe ponv   Fibromyalgia    GERD (gastroesophageal reflux disease)    Hyperlipidemia    Hypertension    Insomnia    Migraines    Mitral valve prolapse    OSA (obstructive sleep apnea)     on CPAP   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 02/03/2021   PONV (postoperative nausea and vomiting)    with general anesthesia severe ponv, does ok with propofol   Vitamin D deficiency    BP 112/73   Pulse 76   Ht  5\' 1"  (1.549 m)   Wt 175 lb (79.4 kg)   SpO2 97%   BMI 33.07 kg/m  Original BP 142/83--rechecked Opioid Risk Score:   Fall Risk Score:  `1  Depression screen [facility] 2/9     07/09/2023    3:07 PM 03/23/2023   10:13 AM 09/05/2013   10:40 AM  Depression screen PHQ 2/9  Decreased Interest 2 2 0  Down, Depressed, Hopeless 2 2 0  PHQ - 2 Score 4 4 0  Altered sleeping  3   Tired, decreased energy  3   Change in appetite  2   Feeling bad or failure about yourself   2   Trouble concentrating  1   Moving slowly or fidgety/restless  0   Suicidal thoughts  0   PHQ-9 Score  15   Difficult doing work/chores  Very difficult      Review of Systems  Constitutional:  Positive for unexpected weight change.       Wt gain  HENT: Negative.    Eyes: Negative.   Respiratory: Negative.    Cardiovascular: Negative.   Gastrointestinal: Negative.   Endocrine: Negative.   Genitourinary:  Positive for urgency.  Musculoskeletal:  Positive for gait problem and myalgias.  Skin: Negative.   Allergic/Immunologic: Negative.   Neurological:  Positive for weakness and numbness.  Hematological: Negative.   Psychiatric/Behavioral:  Positive for dysphoric mood. The patient is nervous/anxious.   All other systems reviewed and are negative.      Objective:   Physical Exam Gen: no distress, normal appearing HEENT: oral mucosa pink and moist, NCAT Cardio: Reg rate Chest: normal effort, normal rate of breathing Abd: soft, non-distended Ext: no edema Psych: pleasant, normal affect Skin: intact Neuro: alert and awake Follows commands, CN 2-12 grossly intact, normal speech and languate Diffuse tenderness throughout bilateral upper and lower extremities,  paraspinal muscles, periscapular muscles No focal motor deficits noted     Xray C spine 02/17/21 EXAMINATION: MRI cervical spine   CLINICAL INDICATION:Neck pain, headaches   TECHNIQUE: MRI cervical spine without contrast.   COMPARISON:None   FINDINGS:   The bone marrow signal is normal.   The craniocervical junction is normal.   The cervical spinal cord is normal.   C2-C3: Normal   C3-C4: The disc is normal. There is mild facet arthropathy on the right.   C4-C5: The disc is normal. There is facet arthropathy on the left.   C5-C6: There is a mild disc bulge. Facet joints are normal.   C6-C7: There is a mild disc bulge. Facet joints are normal   C7-T1: Normal     Assessment & Plan:   Fibromyalgia  -Intolerance to multiple prior medications -Amitriptyline could be an option to try at a later time, savella could also be an option however she is hesitant to try this due to withdrawal with duloxetine -May consider low dose naltrexone, he would like to hold off until completing ketamine treatment -Poor tolerance of cymbalta in the past, it did help pain initially  -Order Tens unit, Zynex and cryoheat device, discussed precautions with this -List of foods for pain discussed  -Patient had to discontinue physical therapy due to work schedule, consider referral for aquatic therapy if her schedule becomes more free.  I advised her to call if she would like to have this ordered. - She plans to try S-ketamine treatment and we discussed options, defer any medication changes until after this is attempted -Discussed gradual progressive low impact exercise  such as walking -Discussed option to try Thera cane for her back -Discussed option for shockwave therapy  Hypermobility syndrome, appears more mild  -joint protection techniques   B/L shoulder pain -OA vs Rotator cuff impingement  -Follows with Ortho, she previously reported having MRI scheduled for this  B/L 4th and 5th digit  numbness -Tinels positive elbow, she has been started on elbow splint at night -Patient does not think she could tolerate EMG nerve conduction study.  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

## 2023-10-08 ENCOUNTER — Encounter: Payer: 59 | Attending: Physical Medicine & Rehabilitation | Admitting: Physical Medicine & Rehabilitation

## 2023-10-08 ENCOUNTER — Encounter: Payer: Self-pay | Admitting: Physical Medicine & Rehabilitation

## 2023-10-08 VITALS — BP 100/68 | HR 78 | Ht 61.0 in | Wt 175.0 lb

## 2023-10-08 DIAGNOSIS — M797 Fibromyalgia: Secondary | ICD-10-CM

## 2023-10-08 DIAGNOSIS — M25512 Pain in left shoulder: Secondary | ICD-10-CM | POA: Diagnosis present

## 2023-10-08 DIAGNOSIS — M25511 Pain in right shoulder: Secondary | ICD-10-CM | POA: Diagnosis not present

## 2023-10-08 DIAGNOSIS — G8929 Other chronic pain: Secondary | ICD-10-CM

## 2023-10-08 DIAGNOSIS — M357 Hypermobility syndrome: Secondary | ICD-10-CM | POA: Diagnosis present

## 2023-10-08 DIAGNOSIS — R200 Anesthesia of skin: Secondary | ICD-10-CM | POA: Diagnosis present

## 2023-10-08 NOTE — Progress Notes (Signed)
Subjective:    Patient ID: Kayla Sanders, female    DOB: 1960-12-21, 62 y.o.   MRN: [id number]  HPI   Kayla Sanders is a 62 y.o. year old female  who  has a past medical history of Anxiety, Anxiety, Asthma, Complication of anesthesia, Family history of adverse reaction to anesthesia, Fibromyalgia, GERD (gastroesophageal reflux disease), Hyperlipidemia, Hypertension, Insomnia, Migraines, Mitral valve prolapse, OSA (obstructive sleep apnea), 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 (02/03/2021), PONV (postoperative nausea and vomiting), and Vitamin D deficiency.   They are presenting to PM&R clinic as a new patient for pain management evaluation.  She is referred for evaluation for her fibromyalgia pain.  She says she was diagnosed many years ago in the 90s with fibromyalgia by rheumatology.  She also has suspected history of Ehlers-Danlos syndrome.  She has never been formally diagnosed but she has daughters with this condition.  She has chronic severe pain throughout her entire body.  She also has a history of shoulder pain bilaterally.  She says she has been seen by orthopedics and they are concerned of a right rotator cuff disorder.  She had an MRI completed of her right shoulder and is waiting on the results.  She also has been noted to have numbness in her digits 4 and 5 in her bilateral hands.  She has been started on nighttime extension bracing for suspected cubital tunnel syndrome.  She had prior right carpal tunnel release in the 90s, continues to have some wrist pain but numbness is much improved.  She continues to suffer with depression anxiety and PTSD.  No SI or HI.  She is followed by mental health.  She was previously tried on duloxetine which initially helped her pain significantly.  Analgesia wore off over time and she had side effects with this medication.  She had withdrawal that was very difficult trying to get off the duloxetine.  She is not currently on  any antidepressant medications, mental health is considering s ketamin treatment.  She has recently been working with physical therapy and this has been helping to improve her pain.  Red flag symptoms: No red flags for back pain endorsed in Hx or ROS  Medications tried: Topical medications - has not tried Nsaids - Cannot take due to GI, stomach cultures Tylenol  - Helps at night Opiates   Fentanyl-surgery, did not tolerate Codeine, oxycodone, hydrocodone- caused a lot of side effects, she can take for very short time Gabapentin- Lyrica can't didn't tolerate but can't remember why Gabantin- concerned about brain fog TCAs  - has not tried  SNRIs  duloxetine- side effects, she reports withdrawal she she stopped this Xanax at night for sleep Considering ketamine for pain   Other treatments: PT/OT - Currently working with PT, helping TENs unit - hasn't tried Injections -shoulder injections helped at first, stopped working Surgery - CTS release on right    Prior UDS results: No results found for: "LABOPIA", "COCAINSCRNUR", "LABBENZ", "AMPHETMU", "THCU", "LABBARB"    07/09/23 interval history [name] is here for follow-up regarding her chronic body wide pain.  She reports she has not heard from [employer] about nexwave. She is planning to try spravato treatment this September.  She had to discontinue her physical therapy due to her busy schedule at work.  She would consider trying aqua therapy at a later time when her work schedule improves.  Patient reports that she had withdrawal when she stopped her Cymbalta previously and that is  why her pain was worse last visit.  When she was taking Cymbalta that she was having nightmares and other side effects that prevented her from continuing it.  She would like to avoid additional medications until Spravato treatment is attempted.  Patient reports she has seen a hand doctor and has MRI planned to evaluate for cubital tunnel.  10/08/23 Ms.  Penland is here for follow-up regarding chronic pain related to her fibromyalgia and hypermobility.  She has been using Spravato for less than 12 weeks.  She feels like this has provided a mild decrease to her pain.  It is also mildly improved her mood which she is doing "ok".  Furthermore she feels like it has helped with her balance.  She is agreeable to trying physical therapy at [facility].  She would consider doing aquatic therapy in the future but would like to hold off at this time.  She continues to have bilateral shoulder pain, has been followed by orthopedics and had an MRI completed earlier this year of her right shoulder.      Pain Inventory Average Pain 4 Pain Right Now 2 My pain is constant, tingling, and aching  In the last 24 hours, has pain interfered with the following? General activity 3 Relation with others 2 Enjoyment of life 2 What TIME of day is your pain at its worst? evening and night Sleep (in general) Poor  Pain is worse with: unsure Pain improves with: therapy/exercise Relief from Meds: 1         Family History  Problem Relation Age of Onset   Arthritis Mother    Hyperlipidemia Mother    Hypertension Mother    Sleep apnea Mother    Colon cancer Mother 39   Lung cancer Mother    Osteopenia Mother    Arthritis Father    Hyperlipidemia Father    Heart disease Father    Stroke Father    Hypertension Father    Sleep apnea Father    Osteopenia Father    Colon polyps Brother    Sleep apnea Brother    Colon polyps Brother    Atrial fibrillation Brother    Lung cancer Maternal Aunt    Osteoporosis Maternal Aunt    Severe combined immunodeficiency Maternal Aunt    Pancreatic cancer Maternal Uncle    Breast cancer Paternal Aunt    Arthritis Other    Breast cancer Other    Diabetes Other    Liver disease Neg Hx    Esophageal cancer Neg Hx    Stomach cancer Neg Hx    Rectal cancer Neg Hx    Social History   Socioeconomic History    Marital status: Married    Spouse name: Not on file   Number of children: 3   Years of education: BS   Highest education level: Not on file  Occupational History   Occupation: [employer]  Tobacco Use   Smoking status: Former    Current packs/day: 0.00    Types: Cigarettes    Quit date: 11/23/1981    Years since quitting: 41.9   Smokeless tobacco: Never  Vaping Use   Vaping status: Never Used  Substance and Sexual Activity   Alcohol use: Never    Comment: Rare, then "zero for ages"   Drug use: No   Sexual activity: Yes  Other Topics Concern   Not on file  Social History Narrative   Married with grown children. Works for [employer] as a Nurse, children's.  Nonsmoker, quit in 1983. No Etoh.   Consumes about 3+ caffeine drinks a day    Social Determinants of Health   Financial Resource Strain: Not on file  Food Insecurity: Not on file  Transportation Needs: Not on file  Physical Activity: Not on file  Stress: Not on file  Social Connections: Unknown (04/05/2022)   Received from [facility]   Social Network    Social Network: Not on file   Past Surgical History:  Procedure Laterality Date   24 HOUR PH STUDY  12/10/2021   Procedure: 24 HOUR PH STUDY;  Surgeon: Napoleon Form, MD;  Location: [facility];  Service: Endoscopy;;   ABDOMINAL HYSTERECTOMY     partial   BIOPSY  06/12/2021   Procedure: BIOPSY;  Surgeon: Napoleon Form, MD;  Location: [facility];  Service: Endoscopy;;   BRAVO PH STUDY N/A 02/06/2013   Procedure: BRAVO PH STUDY;  Surgeon: Charna Elizabeth, MD;  Location: [facility];  Service: Endoscopy;  Laterality: N/A;   CARPAL TUNNEL RELEASE Right    CHOLECYSTECTOMY     COLONOSCOPY WITH PROPOFOL N/A 10/09/2016   Procedure: COLONOSCOPY WITH PROPOFOL;  Surgeon: Jeani Hawking, MD;  Location: [facility];  Service: Endoscopy;  Laterality: N/A;   COLONOSCOPY WITH PROPOFOL N/A 06/12/2021   Procedure: COLONOSCOPY WITH PROPOFOL;  Surgeon: Napoleon Form, MD;  Location: [facility];  Service: Endoscopy;  Laterality: N/A;   colonscopy  2012   ESOPHAGEAL MANOMETRY N/A 12/10/2021   Procedure: ESOPHAGEAL MANOMETRY (EM);  Surgeon: Napoleon Form, MD;  Location: [facility];  Service: Endoscopy;  Laterality: N/A;   ESOPHAGOGASTRODUODENOSCOPY N/A 02/06/2013   Procedure: ESOPHAGOGASTRODUODENOSCOPY (EGD);  Surgeon: Charna Elizabeth, MD;  Location: [facility];  Service: Endoscopy;  Laterality: N/A;   ESOPHAGOGASTRODUODENOSCOPY (EGD) WITH PROPOFOL N/A 10/09/2016   Procedure: ESOPHAGOGASTRODUODENOSCOPY (EGD) WITH PROPOFOL;  Surgeon: Jeani Hawking, MD;  Location: [facility];  Service: Endoscopy;  Laterality: N/A;   ESOPHAGOGASTRODUODENOSCOPY (EGD) WITH PROPOFOL N/A 06/12/2021   Procedure: ESOPHAGOGASTRODUODENOSCOPY (EGD) WITH PROPOFOL;  Surgeon: Napoleon Form, MD;  Location: [facility];  Service: Endoscopy;  Laterality: N/A;   ESOPHAGOGASTRODUODENOSCOPY (EGD) WITH PROPOFOL N/A 12/25/2021   Procedure: ESOPHAGOGASTRODUODENOSCOPY (EGD) WITH PROPOFOL;  Surgeon: Napoleon Form, MD;  Location: [facility];  Service: Endoscopy;  Laterality: N/A;   HEMORRHOIDECTOMY WITH HEMORRHOID BANDING     HOT HEMOSTASIS N/A 06/12/2021   Procedure: HOT HEMOSTASIS (ARGON PLASMA COAGULATION/BICAP);  Surgeon: Napoleon Form, MD;  Location: [facility];  Service: Endoscopy;  Laterality: N/A;   NASAL SEPTUM SURGERY     PH IMPEDANCE STUDY N/A 12/10/2021   Procedure: PH IMPEDANCE STUDY;  Surgeon: Napoleon Form, MD;  Location: [facility];  Service: Endoscopy;  Laterality: N/A;   POLYPECTOMY  06/12/2021   Procedure: POLYPECTOMY;  Surgeon: Napoleon Form, MD;  Location: [facility];  Service: Endoscopy;;   RECTOCELE REPAIR     TMJ ARTHROPLASTY     and removal   TUBAL LIGATION     VESICOVAGINAL FISTULA CLOSURE W/ TAH     Past Medical History:  Diagnosis Date   Anxiety    Anxiety    Asthma    Complication of anesthesia    told by anesthesia  had small airway   Family history of adverse reaction to anesthesia    daughter has severe ponv   Fibromyalgia    GERD (gastroesophageal reflux disease)    Hyperlipidemia    Hypertension    Insomnia    Migraines  Mitral valve prolapse    OSA (obstructive sleep apnea)    on CPAP   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 02/03/2021   PONV (postoperative nausea and vomiting)    with general anesthesia severe ponv, does ok with propofol   Vitamin D deficiency    Pulse 78   Ht 5\' 1"  (1.549 m)   Wt 175 lb (79.4 kg)   SpO2 98%   BMI 33.07 kg/m  Original BP 142/83--rechecked Opioid Risk Score:   Fall Risk Score:  `1  Depression screen [facility] 2/9     10/08/2023    3:17 PM 07/09/2023    3:07 PM 03/23/2023   10:13 AM 09/05/2013   10:40 AM  Depression screen PHQ 2/9  Decreased Interest 1 2 2  0  Down, Depressed, Hopeless 1 2 2  0  PHQ - 2 Score 2 4 4  0  Altered sleeping   3   Tired, decreased energy   3   Change in appetite   2   Feeling bad or failure about yourself    2   Trouble concentrating   1   Moving slowly or fidgety/restless   0   Suicidal thoughts   0   PHQ-9 Score   15   Difficult doing work/chores   Very difficult      Review of Systems  Constitutional:  Positive for unexpected weight change.       Wt gain  HENT: Negative.    Eyes: Negative.   Respiratory: Negative.    Cardiovascular: Negative.   Gastrointestinal: Negative.   Endocrine: Negative.   Genitourinary:  Positive for urgency.  Musculoskeletal:  Positive for gait problem and myalgias.       Pain in all major joints  Skin: Negative.   Allergic/Immunologic: Negative.   Neurological:  Positive for weakness and numbness.  Hematological: Negative.   Psychiatric/Behavioral:  Positive for dysphoric mood. The patient is nervous/anxious.   All other systems reviewed and are negative.      Objective:   Physical Exam Gen: no distress, normal appearing HEENT: oral mucosa pink  and moist, NCAT Cardio: Reg rate Chest: normal effort, normal rate of breathing Abd: soft, non-distended Ext: no edema Psych: pleasant, normal affect Skin: intact Neuro: alert and awake Follows commands, CN 2-12 grossly intact, normal speech and languate Diffuse tenderness throughout bilateral upper and lower extremities, paraspinal muscles, periscapular muscles even with light touch Sensation intact but altered fourth and fifth digits bilateral hands Slump test negative No focal motor deficits noted     Xray C spine 02/17/21 EXAMINATION: MRI cervical spine   CLINICAL INDICATION:Neck pain, headaches   TECHNIQUE: MRI cervical spine without contrast.   COMPARISON:None   FINDINGS:   The bone marrow signal is normal.   The craniocervical junction is normal.   The cervical spinal cord is normal.   C2-C3: Normal   C3-C4: The disc is normal. There is mild facet arthropathy on the right.   C4-C5: The disc is normal. There is facet arthropathy on the left.   C5-C6: There is a mild disc bulge. Facet joints are normal.   C6-C7: There is a mild disc bulge. Facet joints are normal   C7-T1: Normal     Assessment & Plan:   Fibromyalgia  -Intolerance to multiple prior medications -Amitriptyline could be an option to try at a later time, savella could also be an option however she is hesitant to try this due to withdrawal with duloxetine -May consider low  dose naltrexone, he would like to hold off until completing ketamine treatment -Poor tolerance of cymbalta in the past, it did help pain initially  -Prior visit ordered  [employer] Nexwave and cryoheat device, discussed precautions.  She is considering getting this device.  Discussed that she could buy over-the-counter TENS unit also if she prefers. -List of foods for pain discussed  -Patient had to discontinue physical therapy due to work schedule, consider referral for aquatic therapy if her schedule becomes more free.  I advised  her to call if she would like to have this ordered. - She plans to try S-ketamine treatment and we discussed options, defer any medication changes until after this is attempted -Discussed gradual progressive low impact exercise such as walking -Discussed option to try Thera cane for her back -Discussed option for shockwave therapy -PT consult placed, she would like to go to [facility], she will consider aquatic therapy at a later time  Hypermobility syndrome, appears more mild  -Joint protection techniques, she can work on this with PT  B/L shoulder pain -OA vs Rotator cuff impingement  -MRI right shoulder completed by orthopedics, Section of Ortho note 04/09/23 " 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 Mild AC arthrosis otherwise unremarkable" -PT consult as above  B/L 4th and 5th digit numbness -Tinels positive elbow, she has not been wearing elbow splints regularly, they keep falling off -Patient does not think she could tolerate EMG nerve conduction study.   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

## 2023-10-28 ENCOUNTER — Other Ambulatory Visit (INDEPENDENT_AMBULATORY_CARE_PROVIDER_SITE_OTHER): Payer: 59

## 2023-10-28 ENCOUNTER — Ambulatory Visit: Payer: 59 | Admitting: Gastroenterology

## 2023-10-28 ENCOUNTER — Encounter: Payer: Self-pay | Admitting: Gastroenterology

## 2023-10-28 VITALS — BP 122/80 | HR 62 | Ht 60.0 in | Wt 174.0 lb

## 2023-10-28 DIAGNOSIS — K580 Irritable bowel syndrome with diarrhea: Secondary | ICD-10-CM

## 2023-10-28 DIAGNOSIS — D1391 Familial adenomatous polyposis: Secondary | ICD-10-CM

## 2023-10-28 DIAGNOSIS — K449 Diaphragmatic hernia without obstruction or gangrene: Secondary | ICD-10-CM | POA: Diagnosis not present

## 2023-10-28 DIAGNOSIS — Z1509 Genetic susceptibility to other malignant neoplasm: Secondary | ICD-10-CM

## 2023-10-28 DIAGNOSIS — G8929 Other chronic pain: Secondary | ICD-10-CM

## 2023-10-28 DIAGNOSIS — M797 Fibromyalgia: Secondary | ICD-10-CM

## 2023-10-28 DIAGNOSIS — K2100 Gastro-esophageal reflux disease with esophagitis, without bleeding: Secondary | ICD-10-CM

## 2023-10-28 DIAGNOSIS — Z860100 Personal history of colon polyps, unspecified: Secondary | ICD-10-CM

## 2023-10-28 LAB — BASIC METABOLIC PANEL
BUN: 14 mg/dL (ref 6–23)
CO2: 28 meq/L (ref 19–32)
Calcium: 9.4 mg/dL (ref 8.4–10.5)
Chloride: 104 meq/L (ref 96–112)
Creatinine, Ser: 0.76 mg/dL (ref 0.40–1.20)
GFR: 84.13 mL/min (ref >60.00)
Glucose, Bld: 95 mg/dL (ref 70–99)
Potassium: 4 meq/L (ref 3.5–5.1)
Sodium: 138 meq/L (ref 135–145)

## 2023-10-28 LAB — CBC
HCT: 39.2 % (ref 36.0–46.0)
Hemoglobin: 12.9 g/dL (ref 12.0–15.0)
MCHC: 33 g/dL (ref 30.0–36.0)
MCV: 85.1 fL (ref 78.0–100.0)
Platelets: 242 10*3/uL (ref 150.0–400.0)
RBC: 4.61 Mil/uL (ref 3.87–5.11)
RDW: 14.3 % (ref 11.5–15.5)
WBC: 5.5 10*3/uL (ref 4.0–10.5)

## 2023-10-28 MED ORDER — DEXLANSOPRAZOLE 60 MG PO CPDR: 60.0000 mg | DELAYED_RELEASE_CAPSULE | Freq: Every day | ORAL | 3 refills | Status: DC

## 2023-10-28 NOTE — Progress Notes (Signed)
Chief Complaint:    GERD  GI History: Kayla Sanders is a 62 y.o. female with a history of anxiety, asthma, fibromyalgia, HLD, HTN, insomnia, migraines, TMJ dysfunction, mitral valve prolapse, OSA, postop nausea, cholecystectomy, abdominal hysterectomy with vesicovaginal fistula closure, rectocele repair, TMJ arthroplasty, hemorrhoidectomy with banding, nasal septum surgery, initially referred to me by Dr. Lavon Paganini on 01/28/2022 for evaluation of possible antireflux intervention with Transoral Incisionless Fundoplication (TIF) with a goal to stop or significantly reduce acid suppression therapy.   Long standing hx of GERD since teenage years, with multiple meds over the years as below. Started Dexilant about 10 years ago.    Currently taking Dexilant every morning and Carafate prn.    Evaluated by Dr. Doylene Canard at [facility] in 12/2021 for consideration of HHR and antireflux surgery.  Recommended either robotic hiatal hernia repair with 270 degree fundoplication vs concomitant hiatal hernia repair and TIF.   GERD history: -Index symptoms: Heartburn, regurgitation, throat clearing, NCCP -Exacerbating features: Tomato-based sauces, acidic foods, wine -Medications trialed: Prilosec, Prevacid, Protonix, Zegerid, Capadex, Nexium, Dexilant, Pepcid, Carafate -Current medications: Dexilant 60 mg/day,  Carafate prn every 6H -Complications: hiatal hernia, erosive esophagitis   GERD evaluation: - Last EGD: 12/2021 - UGI series: 09/2021: Small HH, moderate gastroesophageal reflux, no stricture.  Normal motility -Esophageal Manometry: 11/2021: Normal -pH/Impedance: 11/2021 (on PPI): DeMeester score 0.5, pH <4 in 0% of time, very few reflux episodes.  No symptom correlation based on SAP c/w appropriate acid suppression therapy on medication -Bravo: Failed study 2014   Endoscopic History: - EGD (01/2013): Widely patent GEJ.  Gastric polyps.  Normal Z-line.  Bravo placed but immediately dislodged - EGD (09/2016):  Normal esophagus.  Empiric dilation with 16 mm Savary.  Normal stomach and duodenum. - EGD (05/2021): LA Grade C esophagitis with bleeding esophageal ulcers, gastric erosions, duodenal ulcers - EGD (12/2021): Normal Z-line, normal esophagus, Hill grade 2 valve.  Normal stomach and duodenum      HPI:     Patient is a 62 y.o. female presenting to the Gastroenterology Clinic for follow-up.  Last seen by me on 08/19/2022.  At that time, reflux largely well-controlled with Dexilant along with Carafate on demand and patient elected to hold off on repair/fundoplication.  Discussed initiating venlafaxine for overlapping functional heartburn, but elected to wait until she had a chance to establish care in the Psychiatry Clinic.  Trialed Cymbalta in Feb for depression w/o much change, and has sincre stopped. Now Wellbutrin only and just added Spravato in September.   Today, she states she has restarted Pepcid 20 mg at bedtime (takes most nights), occasional Carafate. Still taking Dexilant.   Pain management recently prescribed voltaren cream.   No new labs or abdominal imaging for review today.  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     albuterol (VENTOLIN HFA) 108 (90 Base) MCG/ACT inhaler Inhale 2 puffs into the lungs every 4 (four) hours as needed for shortness of breath.     ALPRAZolam (XANAX) 1 MG tablet Take 1 mg by mouth at bedtime.     Azelastine HCl 137 MCG/SPRAY SOLN Place 1 spray into both nostrils daily as needed for allergies.     buPROPion (WELLBUTRIN XL) 150 MG 24 hr tablet Take 1 tab daily (in addition to a single 300mg )     buPROPion (WELLBUTRIN XL) 300 MG 24 hr tablet Take 300 mg by mouth daily.     carboxymethylcellulose (REFRESH PLUS) 0.5 % SOLN Place 1 drop into both eyes 3 (three) times daily as needed (dry eyes).     dexlansoprazole (DEXILANT) 60 MG capsule Take 1 capsule (60 mg total) by mouth daily. 90 capsule 3   diclofenac Sodium (VOLTAREN ARTHRITIS PAIN) 1 % GEL Apply 2 g topically 4 (four) times daily. 150 g 3   famotidine (PEPCID) 20 MG tablet Take 2 tablets (40 mg total) by mouth at bedtime as needed for heartburn or indigestion. 90 tablet 1   famotidine (PEPCID) 40 MG tablet 1 tablet as needed by oral route.     fexofenadine (ALLEGRA) 180 MG tablet Take 180 mg by mouth every evening.     fluconazole (DIFLUCAN) 100 MG tablet Take 100 mg by mouth daily as needed (yeast). For yeast at caesarean scar.     fluticasone (FLONASE) 50 MCG/ACT nasal spray 2 sprays nightly.     hydrocortisone-pramoxine (ANALPRAM-HC) 2.5-1 % rectal cream Place 1 application  rectally daily as needed for hemorrhoids.     levothyroxine (SYNTHROID) 25 MCG tablet Take 25 mcg by mouth daily.     metoprolol succinate (TOPROL-XL) 25 MG 24 hr tablet TAKE 1 TABLET (25 MG TOTAL) BY MOUTH DAILY. 90 tablet 1   montelukast (SINGULAIR) 10 MG tablet Take 1 tablet (10 mg total) by mouth at bedtime. 90 tablet 3   nystatin cream (MYCOSTATIN) Apply 1 application topically 2 (two) times daily as needed  (yeast).     olmesartan (BENICAR) 20 MG tablet Take 20 mg by mouth daily.     olopatadine (PATANOL) 0.1 % ophthalmic solution Place 1 drop into both eyes 2 (two) times daily as needed for allergies.     PRESCRIPTION MEDICATION Place 1 application vaginally 2 (two) times a week. Estradiol ellage 0.02%, 0.2mg /86ml     Propylene Glycol (SYSTANE COMPLETE PF OP) Place 1-2 drops into both eyes 2 (two) times daily.     rosuvastatin (CRESTOR) 5 MG tablet TAKE 1 TABLET (5 MG TOTAL) BY MOUTH DAILY. 90 tablet 3   SPRAVATO, 84 MG DOSE, 28 MG/DEVICE SOPK      terconazole (TERAZOL 3) 0.8 % vaginal cream INSERT 1 APPLICATORFUL VAGINALLY DAILY FOR 3 DAYS AT BEDTIME     triamcinolone cream (KENALOG) 0.1 % APPLY TOPICALLY TO THE AFFECTED AREA 3 TIMES EVERY WEEK     valACYclovir (VALTREX) 500 MG tablet Take 500 mg by mouth daily.     Vitamin D, Ergocalciferol, (DRISDOL) 1.25 MG (50000 UNIT) CAPS capsule  Take 50,000 Units by mouth every Wednesday.     sucralfate (CARAFATE) 1 GM/10ML suspension Take 10 mLs (1 g total) by mouth 4 (four) times daily as needed. 3600 mL 3   No current facility-administered medications for this visit.    Physical Exam:     BP 122/80   Pulse 62   Ht 5' (1.524 m)   Wt 174 lb (78.9 kg)   BMI 33.98 kg/m   GENERAL:  Pleasant female in NAD PSYCH: : Cooperative, normal affect Musculoskeletal:  Normal muscle tone, normal strength NEURO: Alert and oriented x 3, no focal neurologic deficits   IMPRESSION and PLAN:    1) GERD with erosive esophagitis 2) Hiatal hernia  - Resume Dexilant. Refill placed today. Did disucss role/utility of Voquenza if Dexilant not working in the future - Resume Pepcid 20 mg prn at bedtime - Check BMP, CBC for routine yearly evaluation on chronic PPI - Can again discuss role/utility of antireflux surgery with cTIF in the future if she prefers.  As previously discussed, fundoplication could control her reflux symptoms well, but could still have ongoing  heartburn from potential functional overlap - May consider trial of venlafaxine in the future if breakthrough heartburn   3) IBS with urgency - Recommended trialing low FODMAP diet.  Provided with handout and detailed instructions today  4) History of colon polyps 5) MUTYH gene mutation - Due for repeat colonoscopy in 2025  6) Fibromyalgia 7) Chronic pain PMNR would like to start her on Voltaren gel.  Discussed possibility for PUD even with topical NSAIDs, but since she is on chronic acid suppression therapy, may be reasonable to trial.  If trialing and any concern for bleeding, new upper GI symptoms, etc. would plan for EGD to evaluate   RTC in 1 year           Shellia Cleverly ,DO, FACG 10/28/2023, 8:49 AM

## 2023-10-28 NOTE — Patient Instructions (Addendum)
_______________________________________________________  If your blood pressure at your visit was 140/90 or greater, please contact your primary care physician to follow up on this.  If you are age 61 or younger, your body mass index should be between 19-25. Your Body mass index is 33.98 kg/m. If this is out of the aformentioned range listed, please consider follow up with your Primary Care Provider.  ________________________________________________________  The [facility] providers would like to encourage you to use [facility] to communicate with providers for non-urgent requests or questions.  Due to long hold times on the telephone, sending your provider a message by [facility] may be a faster and more efficient way to get a response.  Please allow 48 business hours for a response.  Please remember that this is for non-urgent requests.  _______________________________________________________  Your provider has requested that you go to the basement level for lab work before leaving today. Press "B" on the elevator. The lab is located at the first door on the left as you exit the elevator.  Due to recent changes in healthcare laws, you may see the results of your imaging and laboratory studies on MyChart before your provider has had a chance to review them.  We understand that in some cases there may be results that are confusing or concerning to you. Not all laboratory results come back in the same time frame and the provider may be waiting for multiple results in order to interpret others.  Please give [place] 48 hours in order for your provider to thoroughly review all the results before contacting the office for clarification of your results.   We have sent the following medications to your pharmacy for you to pick up at your convenience:  CONTINUE: Dexilant  It was a pleasure to see you today!  Vito Cirigliano, D.O.

## 2023-11-01 ENCOUNTER — Ambulatory Visit: Payer: 59 | Attending: Nurse Practitioner | Admitting: Physician Assistant

## 2023-11-01 ENCOUNTER — Encounter: Payer: Self-pay | Admitting: Physician Assistant

## 2023-11-01 VITALS — BP 114/70 | HR 73 | Ht 60.0 in | Wt 173.0 lb

## 2023-11-01 DIAGNOSIS — I2510 Atherosclerotic heart disease of native coronary artery without angina pectoris: Secondary | ICD-10-CM | POA: Diagnosis not present

## 2023-11-01 DIAGNOSIS — I10 Essential (primary) hypertension: Secondary | ICD-10-CM

## 2023-11-01 DIAGNOSIS — R002 Palpitations: Secondary | ICD-10-CM | POA: Diagnosis not present

## 2023-11-01 DIAGNOSIS — R079 Chest pain, unspecified: Secondary | ICD-10-CM

## 2023-11-01 DIAGNOSIS — K2100 Gastro-esophageal reflux disease with esophagitis, without bleeding: Secondary | ICD-10-CM

## 2023-11-01 DIAGNOSIS — E785 Hyperlipidemia, unspecified: Secondary | ICD-10-CM | POA: Diagnosis not present

## 2023-11-01 DIAGNOSIS — R072 Precordial pain: Secondary | ICD-10-CM

## 2023-11-01 DIAGNOSIS — R42 Dizziness and giddiness: Secondary | ICD-10-CM

## 2023-11-01 MED ORDER — METOPROLOL SUCCINATE ER 25 MG PO TB24: 25.0000 mg | ORAL_TABLET | Freq: Every day | ORAL | 3 refills | Status: AC

## 2023-11-01 MED ORDER — OLMESARTAN MEDOXOMIL 20 MG PO TABS
20.0000 mg | ORAL_TABLET | Freq: Every day | ORAL | 3 refills | Status: AC
Start: 2023-11-01 — End: ?

## 2023-11-01 MED ORDER — ROSUVASTATIN CALCIUM 5 MG PO TABS: 5.0000 mg | ORAL_TABLET | Freq: Every day | ORAL | 3 refills | Status: AC

## 2023-11-01 NOTE — Patient Instructions (Addendum)
Medication Instructions:  Your physician recommends that you continue on your current medications as directed. Please refer to the Current Medication list given to you today.  *If you need a refill on your cardiac medications before your next appointment, please call your pharmacy*   Lab Work: TODAY:  BMET  If you have labs (blood work) drawn today and your tests are completely normal, you will receive your results only by: MyChart Message (if you have MyChart) OR A paper copy in the mail If you have any lab test that is abnormal or we need to change your treatment, we will call you to review the results.   Testing/Procedures: Your physician has requested that you have cardiac CT. Cardiac computed tomography (CT) is a painless test that uses an x-ray machine to take clear, detailed pictures of your heart. For further information please visit [email or web address] Please follow instruction sheet BELOW:    Your cardiac CT will be scheduled at one of the below locations:   [facility] [address] [place], [place] [address] [phone]    Please arrive at the [facility] and Children's Entrance (Entrance C2) of [facility] 30 minutes prior to test start time. You can use the FREE valet parking offered at entrance C (encouraged to control the heart rate for the test)  Proceed to the [facility] Radiology Department (first floor) to check-in and test prep.  All radiology patients and guests should use entrance C2 at [facility], accessed from [facility], even though the hospital's physical address listed is [address]     There is spacious parking and easy access to the radiology department from the [facility] Heart [facility] Vascular entrance. Please enter here and check-in with the desk attendant.   Please follow these instructions carefully (unless otherwise directed):  An IV will be required for this test and Nitroglycerin will be  given.    On the Night Before the Test: Be sure to Drink plenty of water. Do not consume any caffeinated/decaffeinated beverages or chocolate 12 hours prior to your test. Do not take any antihistamines 12 hours prior to your test.   On the Day of the Test: Drink plenty of water until 1 hour prior to the test. Do not eat any food 1 hour prior to test. You may take your regular medications prior to the test.  Take 2 OF THE METOPROLOL XL TABLETS  two hours prior to test. FEMALES- please wear underwire-free bra if available, avoid dresses & tight clothing      After the Test: Drink plenty of water. After receiving IV contrast, you may experience a mild flushed feeling. This is normal. On occasion, you may experience a mild rash up to 24 hours after the test. This is not dangerous. If this occurs, you can take Benadryl 25 mg and increase your fluid intake. If you experience trouble breathing, this can be serious. If it is severe call 911 IMMEDIATELY. If it is mild, please call our office.  We will call to schedule your test 2-4 weeks out understanding that some insurance companies will need an authorization prior to the service being performed.   For more information and frequently asked questions, please visit our website : [email or web address]  For non-scheduling related questions, please contact the cardiac imaging nurse navigator should you have any questions/concerns: Cardiac Imaging Nurse Navigators Direct Office Dial: [phone]   For scheduling needs, including cancellations and rescheduling, please call [place], [phone].  Follow-Up: At [facility], you and your health needs are our priority.  As part of our continuing mission to provide you with exceptional heart care, we have created designated Provider Care Teams.  These Care Teams include your primary Cardiologist (physician) and Advanced Practice Providers (APPs -  Physician Assistants  and Nurse Practitioners) who all work together to provide you with the care you need, when you need it.  We recommend signing up for the patient portal called "MyChart".  Sign up information is provided on this After Visit Summary.  MyChart is used to connect with patients for Virtual Visits (Telemedicine).  Patients are able to view lab/test results, encounter notes, upcoming appointments, etc.  Non-urgent messages can be sent to your provider as well.   To learn more about what you can do with MyChart, go to [email or web address].    Your next appointment:   6 month(s)  Provider:   Armanda Magic, MD     Other Instructions

## 2023-11-01 NOTE — Progress Notes (Signed)
Cardiology Office Note:  .   Date:  11/01/2023  ID:  Kayla Sanders, DOB 02/18/1961, MRN [id number] PCP: Dois Davenport, MD  [facility] Providers Cardiologist:  Armanda Magic, MD {  History of Present Illness: .   Kayla Sanders is a 62 y.o. female with a history of chest pain, palpitations, mild nonobstructive CAD, HLD, OSA on CPAP, and hypertension here for follow-up appointment.  Initially saw Dr. Excell Seltzer in 2013 for chest pain and palpitations.  Reported chest burning which was relieved by belching that occurs at rest and exertion.  Discomfort associated with walking described as pressure.  Was under stress caring for her father and her children.  History of GERD and normal stress test in 2013.   Return for evaluation 01/06/2021 with Dr. Tenny Craw.  Reported a different squeezing sensation of chest pain than previous.  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  Cardiac monitor 4/22 revealed PACs and PVCs and she was started on metoprolol at that time.  Was feeling better on a beta-blocker.  Having problems with CPAP and was advised to follow-up with sleep medicine.  She was seen 10/27/2033 for annual follow-up.  Reports she was feeling well.  Occasional palpitations that were much better controlled on beta-blocker.  Occasional episodes of lightheadedness which she attributed to possible hypotension.  Both daughters have POTS.  BP is usually well below 130/80.  Not been exercising on a consistent basis but wanted to install on treadmill in her home office.  No recent labs.  Denied chest pain, SOB, lower extremity edema, fatigue, melena, hematuria, hemoptysis, diaphoresis, weakness, presyncope, syncope, orthopnea, and PND.  Today, she has a history of fibromyalgia and heart conditions, presents  for a follow-up visit after experiencing lightheadedness. The patient reports that the lightheadedness is sometimes triggered by sudden movements or changes in position, and she has been experiencing balance issues. She has started physical therapy to improve her balance and manage her fibromyalgia symptoms.  The patient also reports occasional chest pain, which she believes could be due to reflux and irritable bowel syndrome. She describes the chest pain as sometimes occurring during exertion or when her blood pressure is taken. The patient is on medication for heart conditions, including metoprolol, Benicar, and Crestor, and is concerned about the potential side effects of these medications, particularly hypotension.  The patient's family history includes aunts with aneurysms, one in the aorta and one in the brain. The patient is concerned about her own risk for aneurysms, particularly given her heart conditions and the medications she is taking.  Reports no shortness of breath nor dyspnea on exertion. . No edema, orthopnea, PND. Reports no palpitations.   Discussed the use of AI scribe software for clinical note transcription with the patient, who gave verbal consent to proceed.   ROS: pertinent ROS in HPI  Studies Reviewed: Marland Kitchen       Coronary CTA 01/22/21 IMPRESSION: 1. Coronary calcium score of 3.36. This was 69th percentile for age and sex matched control.   2. Normal coronary origin with right dominance.   3. Minimal (<25%) calcified plaque int he LAD.  CAD-RADS 1.   4. 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 There are four right pulmonary veins and two on the left.   Chilton Si, MD    LABS ANA:  Positive ANA titers: Negative HbA1c: 5.7 (08/26/2023) TSH: 3.76 (08/26/2023) Lipid panel: Total: 137, HDL: 61, Triglycerides: 100, LDL: 57 (08/26/2023) CRP: Elevated  Physical Exam:   VS:  BP 114/70   Pulse 73   Ht 5' (1.524 m)   Wt 173 lb (78.5 kg)   SpO2  96%   BMI 33.79 kg/m    Wt Readings from Last 3 Encounters:  11/01/23 173 lb (78.5 kg)  10/28/23 174 lb (78.9 kg)  10/08/23 175 lb (79.4 kg)    GEN: Well nourished, well developed in no acute distress NECK: No JVD; No carotid bruits CARDIAC: RRR, no murmurs, rubs, gallops RESPIRATORY:  Clear to auscultation without rales, wheezing or rhonchi  ABDOMEN: Soft, non-tender, non-distended EXTREMITIES:  No edema; No deformity   ASSESSMENT AND PLAN: .       Lightheadedness and Balance Issues Lightheadedness reported, possibly positional. No vertigo. Balance issues noted, currently undergoing physical therapy. No significant findings on EKG. -Continue physical therapy for balance improvement. -Stay hydrated, especially during physical activity. -Order repeat CT scan to rule out progression of mild disease in LAD and provide reassurance.  Chest Pain Occasional chest pain reported, possibly related to reflux or gas. Mild disease in LAD noted on previous CT scan. Pain sometimes occurs with exertion and during blood pressure measurement. -Order repeat CT scan to rule out progression of mild disease in LAD and provide reassurance.  Hyperlipidemia On low dose Crestor 5mg  daily. Recent lipid panel showed good control with LDL of 57. -Continue Crestor 5mg  daily.  Hypertension On Metoprolol and Benicar. Blood pressure well controlled at 114/70. -Continue Metoprolol and Benicar as prescribed.  Gastroesophageal Reflux Disease (GERD) Chronic reflux reported, possibly contributing to chest pain. -Continue current management.  Family History of Aneurysms Noted family history of aortic and cerebral aneurysms. Previous CT scan showed normal aorta. -No immediate action required, continue monitoring.  Dispo: She can follow-up in 6 months.  Signed, Sharlene Dory, PA-C

## 2023-11-02 ENCOUNTER — Ambulatory Visit: Payer: 59 | Admitting: Nurse Practitioner

## 2023-11-04 ENCOUNTER — Encounter: Payer: Self-pay | Admitting: Gastroenterology

## 2023-11-05 ENCOUNTER — Encounter: Payer: Self-pay | Admitting: Physical Medicine & Rehabilitation

## 2023-11-05 MED ORDER — RIFAXIMIN 550 MG PO TABS: 550.0000 mg | ORAL_TABLET | Freq: Three times a day (TID) | ORAL | 0 refills | Status: AC

## 2023-11-05 NOTE — Telephone Encounter (Signed)
Dr C-  Will send a copy of FODMAP diet on mychart for patient. Please advise regarding xifaxan request.

## 2023-11-08 ENCOUNTER — Encounter: Payer: Self-pay | Admitting: Gastroenterology

## 2023-11-09 NOTE — Telephone Encounter (Signed)
Dr C- Patient's Xifaxan has been denied because  This request was denied because you did not meet the following requirements: The requested medication is not covered because it is not on the listing or formulary of approved drugs for your plan benefit. Please discuss alternative drug therapy with your doctor.The request for coverage for XIFAXAN TAB 550MG , use as directed (42 per 14 days), is denied. This decision is based on health plan criteria for XIFAXAN TAB 550MG . This medicine is covered only if: One of the following: (I) You have a history of failure, contraindication or intolerance to Viberzi*. (II) You have a history of or potential for a substance abuse disorder.  Please advise.Marland KitchenMarland Kitchen

## 2023-11-09 NOTE — Telephone Encounter (Signed)
11/09/2023 Kayla Sanders [address] [place], [place] [address] Plan member ID: [id number] Case number: [id number] Prescriber name: Doristine Locks Prescriber fax: [phone]  NOTICE OF DENIAL Dear Kayla Sanders, On behalf of [employer], [facility] is responsible for reviewing pharmacy services provided to [facility] members. We received a request from your prescriber for coverage of Xifaxan Tab 550mg .We reviewed all of the information you and/or your doctor sent to [place] and sent the information to an appropriate physician specialist if needed. Unfortunately, we must deny coverage for Xifaxan.  Why was my request denied? This request was denied because you did not meet the following requirements: The requested medication is not covered because it is not on the listing or formulary of approved drugs for your plan benefit. Please discuss alternative drug therapy with your doctor.The request for coverage for XIFAXAN TAB 550MG , use as directed (42 per 14 days), is denied. This decision is based on health plan criteria for XIFAXAN TAB 550MG . This medicine is covered only if: One of the following: (I) You have a history of failure, contraindication or intolerance to Viberzi*. (II) You have a history of or potential for a substance abuse disorder. The information provided does not show that you meet the criteria listed above. Reviewed by: R.Ph. *Please note: This product may require prior authorization. The reason(s) [facility] did not approve this medication can be found above. This denial is based on our Xifaxan drug coverage policy, in addition to any supplementary information you or your prescriber may have submitted. How can I obtain the material(s) used to review this request? You may request, free of charge, a copy of the drug coverage policy, actual benefit provision, guideline, protocol or other information that factored into the decision, including the diagnosis code and the  treatment code and their corresponding meanings, by calling us at [phone], or by writing to the address below: [facility] c/o Prior Authorization Guidelines [address] [place], [place] [address]  Please note that this decision only affects whether your prescription plan will pay for this medication. Only you and your prescriber can decide what is best for you and your treatment. You may still buy this medication (at full cost) at your local pharmacy. What if my prescriber wants to discuss this decision with a peer? Your prescriber may request to discuss this decision with a reviewing physician or other appropriate reviewer by contacting us at [phone].  APPEAL/COMPLAINT PROCESS What if I don't agree with this decision? You have the right to appeal/file a complaint against any decision that denies payment for an item or service (in whole or in part) orally or in writing. If an appeal/a complaint is submitted orally and the resolution of the appeal/complaint is partially or wholly adverse to you, or the oral complaint is not resolved to your satisfaction within 10 (ten) days, please submit the appeal/complaint in writing. You may also submit written comments, documents or other information relevant to the appeal/complaint.  Who may file an appeal/complaint? You, your prescriber, or your authorized representative (someone you name to act for you, such as a family member, an attorney or a friend) may file an appeal. [employer] may implement reasonable procedures to determine whether an individual has been authorized to act as Medical illustrator.  How do I file an appeal? You have the right to appeal this medication coverage decision within 180 calendar days from the date of this denial notification. You or your prescriber can get appeals information, including independent appeal rights, by calling  the toll-free member number listed on your health plan ID card. You can also review your plan's  prescription drug benefit information or contact your benefits office for more detailed information regarding the appeal process. To file an appeal, please send any written comments, documents or other relevant documentation with your appeal to the address listed below: [employer] [address] [place], [place] [address] Phone: Please call the toll-free member number listed on your health plan ID card. Fax: [phone] Expedited/Urgent Fax: [phone]  How long does the appeals process take? If you proceed with the appeals process, [employer] will review the denial decision and provide you with a written determination within 15 calendar days of receiving your appeal. If any of the following occurs, you may be able to request an external review of your claim by an independent third party which will review the denial and issue a final decision:  You do not receive a timely decision  [employer] continues to deny the payment, coverage or service requested after the final level of internal appeal  [employer] does not adhere to certain legal requirements regarding claims procedures  Exception Requests Appeal for Coverage of Clinically Appropriate Non-Formulary Drugs If your denial is for a non-formulary exception request, the following timeframes will apply: Standard (non-expedited) requests - we will notify you, your authorized representative, and the prescriber of the determination no later than 72 hours after receipt of the request. If the request is granted, the excepted drug will be covered for the duration of the prescription, including refills.  Expedited requests due to exigent circumstances -- we will notify you, your authorized representative, and the prescriber of the determination no later than 24 hours after receipt of the request. If the request is granted, the excepted drug will be covered throughout the exigency. (Exigent circumstance is when  you are suffering from a health condition that may seriously jeopardize the enrollee's life, health, or ability to regain maximum function or when an enrollee is undergoing a current course of treatment using a non-formulary drug.)  What if my appeal is urgent? If your situation meets the definition of urgent under the law, your review will be rushed. Generally, an urgent situation is one in which the standard time frame for a decision:  Could seriously jeopardize your life or health or your ability to regain maximum function, based on a prudent layperson's judgment  In the opinion of a practitioner with knowledge of your medical condition, would subject you to severe pain that cannot be adequately managed without the care or treatment that is the subject of the request  If you believe your situation is urgent, you may request an expedited appeal by calling [employer] at the toll-free member number listed on your health plan ID card. You will be notified of the result of your expedited appeal within 72 hours from the receipt of the appeal request. If you are in an urgent situation, you may be allowed to proceed with an expedited external review at the same time as the internal appeals process under your plan.  EXTERNAL REVIEW PROCESS What is an external review? An external review is a complete re-examination of your case by a state approved contract vendor. Its employees and physicians are impartial, separate from, and has no affiliation with your plan.  Who may file an external review?  You, your prescriber, or your authorized representative may request an external review.  How do I file an external review?  You may request the form by  phone, email or by submitting a written request to through the contact information listed below: [facility] ATTN: ASO External Review [address] [address] [place], [place] [address] Fax Number: [phone]  If it is determined  that your request is eligible for external review, the Department of Commerce will forward your case to a contract vendor for review. Once your matter is forwarded for review, a normal external review may take up to 45 days after the case is submitted (with expedited reviews completed in 72 hours or less). The result of an external appeal is nonbinding on you, but it is binding [employer]. (If you lose, you have the right to appeal the decision in court.) If [employer] loses, we cannot appeal the decision, and we will authorize or pay for the services in question. There is a $25 fee for each external review. This fee will be refunded if the external review decision is in your favor. The fee must be paid by check, made out to: [facility] Department of Commerce. (This fee may be waived in cases of financial hardship. You should explain why payment of this fee would be a financial hardship and provide enough information to support your claim.)  What if my external review request is urgent? If your matter is urgent, you may make a request for an expedited external review. If you believe a 45 day wait could harm your health, or the health of the person you are representing, you may get an expedited 72 hour appeal. You can provide the request for an expedited appeal by phone call or fax. You must provide the information requested on the application and fax [phone]), e-mail (consumer.protection@state .[place]) or phone [phone] or [phone]) the request to the Department of Commerce.  OTHER RESOURCES TO HELP YOU (EMPLOYER GROUP PLAN) Availability of Consumer Assistance/Ombudsman Services There may be other resources available to help you understand the ("appeals" or "grievance") process.Your state consumer assistance program may also be able to assist you at: [facility] [place] Toll Free Telephone: ([phone])756-6944 Website:  [email or web address] By Mail: [address] [place] [address] In Person: [facility] [address] [place] [address]  Who reviewed this request? This request has been reviewed by a physician or pharmacist, who is a reviewer for [facility] on behalf of [employer], with the same or a similar specialty available to review your case. If you have any additional questions, please call us toll-free at [phone].  Sincerely, [facility] cc: Doristine Locks

## 2023-11-10 NOTE — Telephone Encounter (Signed)
Contacted patient's insurance company and spoke to [place]. She has resubmitted prior authorization request for Xifaxan with the information that patient has contraindication to viberzi (hx cholecystectomy). Burman Blacksmith has now been approved through insurance through 11/22/23.

## 2023-11-11 LAB — BASIC METABOLIC PANEL
BUN/Creatinine Ratio: 12 (ref 12–28)
BUN: 11 mg/dL (ref 8–27)
CO2: 23 mmol/L (ref 20–29)
Calcium: 9.3 mg/dL (ref 8.7–10.3)
Chloride: 103 mmol/L (ref 96–106)
Creatinine, Ser: 0.94 mg/dL (ref 0.57–1.00)
Glucose: 95 mg/dL (ref 70–99)
Potassium: 4.3 mmol/L (ref 3.5–5.2)
Sodium: 140 mmol/L (ref 134–144)
eGFR: 69 mL/min/{1.73_m2} (ref >59)

## 2023-11-12 ENCOUNTER — Ambulatory Visit: Payer: 59 | Admitting: Nurse Practitioner

## 2023-11-18 ENCOUNTER — Encounter (HOSPITAL_COMMUNITY): Payer: Self-pay

## 2023-11-22 ENCOUNTER — Ambulatory Visit (HOSPITAL_COMMUNITY)
Admission: RE | Admit: 2023-11-22 | Discharge: 2023-11-22 | Disposition: A | Discharge status: Home / Self Care | Payer: 59 | Source: Ambulatory Visit | Attending: Physician Assistant | Admitting: Physician Assistant

## 2023-11-22 DIAGNOSIS — R072 Precordial pain: Secondary | ICD-10-CM | POA: Insufficient documentation

## 2023-11-22 MED ORDER — NITROGLYCERIN 0.4 MG SL SUBL
0.8000 mg | SUBLINGUAL_TABLET | Freq: Once | SUBLINGUAL | Status: AC
  Administered 2023-11-22: 0.8 mg via SUBLINGUAL

## 2023-11-22 MED ORDER — NITROGLYCERIN 0.4 MG SL SUBL
SUBLINGUAL_TABLET | SUBLINGUAL | Status: AC
Start: 2023-11-22 — End: ?
  Filled 2023-11-22: qty 2

## 2023-11-22 MED ORDER — IOHEXOL 350 MG/ML SOLN
95.0000 mL | Freq: Once | INTRAVENOUS | Status: AC | PRN
  Administered 2023-11-22: 95 mL via INTRAVENOUS

## 2024-01-12 ENCOUNTER — Telehealth: Payer: Self-pay

## 2024-01-12 DIAGNOSIS — M797 Fibromyalgia: Secondary | ICD-10-CM

## 2024-01-12 NOTE — Telephone Encounter (Signed)
Tobi Bastos from [facility] called stating they need a new order for patient. Fax # [phone], ph # [phone]

## 2024-02-11 ENCOUNTER — Encounter: Payer: Self-pay | Admitting: Physical Medicine & Rehabilitation

## 2024-02-11 ENCOUNTER — Encounter: Payer: 59 | Attending: Physical Medicine & Rehabilitation | Admitting: Physical Medicine & Rehabilitation

## 2024-02-11 VITALS — BP 117/76 | HR 73 | Ht 60.0 in | Wt 170.0 lb

## 2024-02-11 DIAGNOSIS — M797 Fibromyalgia: Secondary | ICD-10-CM

## 2024-02-11 DIAGNOSIS — M357 Hypermobility syndrome: Secondary | ICD-10-CM

## 2024-02-11 DIAGNOSIS — M1990 Unspecified osteoarthritis, unspecified site: Secondary | ICD-10-CM | POA: Diagnosis present

## 2024-02-11 NOTE — Progress Notes (Signed)
 Subjective:    Patient ID: Kayla Sanders, female    DOB: 1961-07-07, 63 y.o.   MRN: [id number]  HPI   Kayla Sanders is a 63 y.o. year old female  who  has a past medical history of Anxiety, Anxiety, Asthma, Complication of anesthesia, Family history of adverse reaction to anesthesia, Fibromyalgia, GERD (gastroesophageal reflux disease), Hyperlipidemia, Hypertension, Insomnia, Migraines, Mitral valve prolapse, OSA (obstructive sleep apnea), 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 (02/03/2021), PONV (postoperative nausea and vomiting), and Vitamin D deficiency.   They are presenting to PM&R clinic as a new patient for pain management evaluation.  She is referred for evaluation for her fibromyalgia pain.  She says she was diagnosed many years ago in the 90s with fibromyalgia by rheumatology.  She also has suspected history of Ehlers-Danlos syndrome.  She has never been formally diagnosed but she has daughters with this condition.  She has chronic severe pain throughout her entire body.  She also has a history of shoulder pain bilaterally.  She says she has been seen by orthopedics and they are concerned of a right rotator cuff disorder.  She had an MRI completed of her right shoulder and is waiting on the results.  She also has been noted to have numbness in her digits 4 and 5 in her bilateral hands.  She has been started on nighttime extension bracing for suspected cubital tunnel syndrome.  She had prior right carpal tunnel release in the 90s, continues to have some wrist pain but numbness is much improved.  She continues to suffer with depression anxiety and PTSD.  No SI or HI.  She is followed by mental health.  She was previously tried on duloxetine which initially helped her pain significantly.  Analgesia wore off over time and she had side effects with this medication.  She had withdrawal that was very difficult trying to get off the duloxetine.  She is not currently on  any antidepressant medications, mental health is considering s ketamin treatment.  She has recently been working with physical therapy and this has been helping to improve her pain.  Red flag symptoms: No red flags for back pain endorsed in Hx or ROS  Medications tried: Topical medications - has not tried Nsaids - Cannot take due to GI, stomach cultures Tylenol  - Helps at night Opiates   Fentanyl-surgery, did not tolerate Codeine, oxycodone, hydrocodone- caused a lot of side effects, she can take for very short time Gabapentin- Lyrica can't didn't tolerate but can't remember why Gabantin- concerned about brain fog TCAs  - has not tried  SNRIs  duloxetine- side effects, she reports withdrawal she she stopped this Xanax at night for sleep Considering ketamine for pain   Other treatments: PT/OT - Currently working with PT, helping TENs unit - hasn't tried Injections -shoulder injections helped at first, stopped working Surgery - CTS release on right    Prior UDS results: No results found for: "LABOPIA", "COCAINSCRNUR", "LABBENZ", "AMPHETMU", "THCU", "LABBARB"    07/09/23 interval history Kayla Sanders is here for follow-up regarding her chronic body wide pain.  She reports she has not heard from [employer] about nexwave. She is planning to try spravato treatment this September.  She had to discontinue her physical therapy due to her busy schedule at work.  She would consider trying aqua therapy at a later time when her work schedule improves.  Patient reports that she had withdrawal when she stopped her Cymbalta previously and that is  why her pain was worse last visit.  When she was taking Cymbalta that she was having nightmares and other side effects that prevented her from continuing it.  She would like to avoid additional medications until Spravato treatment is attempted.  Patient reports she has seen a hand doctor and has MRI planned to evaluate for cubital tunnel.  10/08/23 Ms.  Sanders is here for follow-up regarding chronic pain related to her fibromyalgia and hypermobility.  She has been using Spravato for less than 12 weeks.  She feels like this has provided a mild decrease to her pain.  It is also mildly improved her mood which she is doing "ok".  Furthermore she feels like it has helped with her balance.  She is agreeable to trying physical therapy at [facility].  She would consider doing aquatic therapy in the future but would like to hold off at this time.  She continues to have bilateral shoulder pain, has been followed by orthopedics and had an MRI completed earlier this year of her right shoulder.  Interval history 01/14/2024 Patient reports she has been seen by rheumatology recently, had imaging studies to evaluate for osteoarthritis.  Patient says she was told she also might have psoriatic arthritis?   Chart review indicates rheumatology does not think she has rheumatoid arthritis or lupus.  X-rays were ordered to evaluate extent of osteoarthritis.  And biopsy of rash by dermatology recommended if it reoccurs to confirm if rash is autoimmune in nature.  She reports she continues to take s-ketamine now at increased dose.  This was helping her mood but recently has been worse.  She is considering other options such as TMS.  She is working with land physical therapy, would consider aquatic therapy at a later time but not now  She is not interested in any medication changes at this time  Pain Inventory Average Pain 4 Pain Right Now 4 My pain is constant, tingling, and aching, sharp  In the last 24 hours, has pain interfered with the following? General activity 3 Relation with others 3 Enjoyment of life 5 What TIME of day is your pain at its worst? evening and night Sleep (in general) Poor  Pain is worse with: walking, bending, sitting, inactivity, statnding,  Pain improves with: therapy/exercise, heat/ice, medication Relief from Meds:  4         Family History  Problem Relation Age of Onset   Arthritis Mother    Hyperlipidemia Mother    Hypertension Mother    Sleep apnea Mother    Colon cancer Mother 91   Lung cancer Mother    Osteopenia Mother    Arthritis Father    Hyperlipidemia Father    Heart disease Father    Stroke Father    Hypertension Father    Sleep apnea Father    Osteopenia Father    Colon polyps Brother    Sleep apnea Brother    Colon polyps Brother    Atrial fibrillation Brother    Lung cancer Maternal Aunt    Osteoporosis Maternal Aunt    Severe combined immunodeficiency Maternal Aunt    Pancreatic cancer Maternal Uncle    Breast cancer Paternal Aunt    Arthritis Other    Breast cancer Other    Diabetes Other    Liver disease Neg Hx    Esophageal cancer Neg Hx    Stomach cancer Neg Hx    Rectal cancer Neg Hx    Social History   Socioeconomic History  Marital status: Married    Spouse Sanders: Not on file   Number of children: 3   Years of education: BS   Highest education level: Not on file  Occupational History   Occupation: [employer]  Tobacco Use   Smoking status: Former    Current packs/day: 0.00    Types: Cigarettes    Quit date: 11/23/1981    Years since quitting: 42.2   Smokeless tobacco: Never  Vaping Use   Vaping status: Never Used  Substance and Sexual Activity   Alcohol use: Never    Comment: Rare, then "zero for ages"   Drug use: No   Sexual activity: Yes  Other Topics Concern   Not on file  Social History Narrative   Married with grown children. Works for [employer] as a Nurse, children's. Nonsmoker, quit in 1983. No Etoh.   Consumes about 3+ caffeine drinks a day    Social Drivers of Corporate investment banker Strain: Not on file  Food Insecurity: Not on file  Transportation Needs: Not on file  Physical Activity: Not on file  Stress: Not on file  Social Connections: Unknown (04/05/2022)   Received from [facility]   Social Network     Social Network: Not on file   Past Surgical History:  Procedure Laterality Date   24 HOUR PH STUDY  12/10/2021   Procedure: 24 HOUR PH STUDY;  Surgeon: Napoleon Form, MD;  Location: [facility];  Service: Endoscopy;;   ABDOMINAL HYSTERECTOMY     partial   BIOPSY  06/12/2021   Procedure: BIOPSY;  Surgeon: Napoleon Form, MD;  Location: [facility];  Service: Endoscopy;;   BRAVO PH STUDY N/A 02/06/2013   Procedure: BRAVO PH STUDY;  Surgeon: Charna Elizabeth, MD;  Location: [facility];  Service: Endoscopy;  Laterality: N/A;   CARPAL TUNNEL RELEASE Right    CHOLECYSTECTOMY     COLONOSCOPY WITH PROPOFOL N/A 10/09/2016   Procedure: COLONOSCOPY WITH PROPOFOL;  Surgeon: Jeani Hawking, MD;  Location: [facility];  Service: Endoscopy;  Laterality: N/A;   COLONOSCOPY WITH PROPOFOL N/A 06/12/2021   Procedure: COLONOSCOPY WITH PROPOFOL;  Surgeon: Napoleon Form, MD;  Location: [facility];  Service: Endoscopy;  Laterality: N/A;   colonscopy  2012   ESOPHAGEAL MANOMETRY N/A 12/10/2021   Procedure: ESOPHAGEAL MANOMETRY (EM);  Surgeon: Napoleon Form, MD;  Location: [facility];  Service: Endoscopy;  Laterality: N/A;   ESOPHAGOGASTRODUODENOSCOPY N/A 02/06/2013   Procedure: ESOPHAGOGASTRODUODENOSCOPY (EGD);  Surgeon: Charna Elizabeth, MD;  Location: [facility];  Service: Endoscopy;  Laterality: N/A;   ESOPHAGOGASTRODUODENOSCOPY (EGD) WITH PROPOFOL N/A 10/09/2016   Procedure: ESOPHAGOGASTRODUODENOSCOPY (EGD) WITH PROPOFOL;  Surgeon: Jeani Hawking, MD;  Location: [facility];  Service: Endoscopy;  Laterality: N/A;   ESOPHAGOGASTRODUODENOSCOPY (EGD) WITH PROPOFOL N/A 06/12/2021   Procedure: ESOPHAGOGASTRODUODENOSCOPY (EGD) WITH PROPOFOL;  Surgeon: Napoleon Form, MD;  Location: [facility];  Service: Endoscopy;  Laterality: N/A;   ESOPHAGOGASTRODUODENOSCOPY (EGD) WITH PROPOFOL N/A 12/25/2021   Procedure: ESOPHAGOGASTRODUODENOSCOPY (EGD) WITH PROPOFOL;  Surgeon: Napoleon Form, MD;   Location: [facility];  Service: Endoscopy;  Laterality: N/A;   HEMORRHOIDECTOMY WITH HEMORRHOID BANDING     HOT HEMOSTASIS N/A 06/12/2021   Procedure: HOT HEMOSTASIS (ARGON PLASMA COAGULATION/BICAP);  Surgeon: Napoleon Form, MD;  Location: [facility];  Service: Endoscopy;  Laterality: N/A;   NASAL SEPTUM SURGERY     PH IMPEDANCE STUDY N/A 12/10/2021   Procedure: PH IMPEDANCE STUDY;  Surgeon: Napoleon Form, MD;  Location: [facility];  Service: Endoscopy;  Laterality: N/A;   POLYPECTOMY  06/12/2021   Procedure: POLYPECTOMY;  Surgeon: Napoleon Form, MD;  Location: [facility];  Service: Endoscopy;;   RECTOCELE REPAIR     TMJ ARTHROPLASTY     and removal   TUBAL LIGATION     VESICOVAGINAL FISTULA CLOSURE W/ TAH     Past Medical History:  Diagnosis Date   Anxiety    Anxiety    Asthma    Complication of anesthesia    told by anesthesia had small airway   Family history of adverse reaction to anesthesia    daughter has severe ponv   Fibromyalgia    GERD (gastroesophageal reflux disease)    Hyperlipidemia    Hypertension    Insomnia    Migraines    Mitral valve prolapse    OSA (obstructive sleep apnea)    on CPAP   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 Original BP 142/83--rechecked Opioid Risk Score:   Fall Risk Score:  `1  Depression screen [facility] 2/9     10/08/2023    3:17 PM 07/09/2023    3:07 PM 03/23/2023   10:13 AM 09/05/2013   10:40 AM  Depression screen PHQ 2/9  Decreased Interest 1 2 2  0  Down, Depressed, Hopeless 1 2 2  0  PHQ - 2 Score 2 4 4  0  Altered sleeping   3   Tired, decreased energy   3   Change in appetite   2   Feeling bad or failure about yourself    2   Trouble concentrating   1   Moving slowly or fidgety/restless   0    Suicidal thoughts   0   PHQ-9 Score   15   Difficult doing work/chores   Very difficult      Review of Systems  Constitutional:  Positive for unexpected weight change.       Wt gain  HENT: Negative.    Eyes: Negative.   Respiratory: Negative.    Cardiovascular: Negative.   Gastrointestinal: Negative.   Endocrine: Negative.   Genitourinary:  Positive for urgency.  Musculoskeletal:  Positive for gait problem and myalgias.       Pain in all major joints  Skin: Negative.   Allergic/Immunologic: Negative.   Neurological:  Positive for weakness and numbness.  Hematological: Negative.   Psychiatric/Behavioral:  Positive for dysphoric mood. The patient is nervous/anxious.   All other systems reviewed and are negative.      Objective:   Physical Exam Gen: no distress, normal appearing HEENT: oral mucosa pink and moist, NCAT Cardio: Reg rate Chest: normal effort, normal rate of breathing Abd: soft, non-distended Ext: no edema Psych: pleasant, normal affect Skin: intact Neuro: alert and awake Follows commands, CN 2-12 grossly intact, normal speech and languate Diffuse tenderness throughout bilateral upper and lower extremities, paraspinal muscles, periscapular muscles even with light touch-she reports that all of these areas will be very painful later today.   Prior exam Sensation intact but altered fourth and fifth digits bilateral hands Slump test negative No focal motor deficits noted     Xray C spine 02/17/21 EXAMINATION: MRI cervical spine   CLINICAL INDICATION:Neck pain, headaches   TECHNIQUE: MRI cervical spine without contrast.  COMPARISON:None   FINDINGS:   The bone marrow signal is normal.   The craniocervical junction is normal.   The cervical spinal cord is normal.   C2-C3: Normal   C3-C4: The disc is normal. There is mild facet arthropathy on the right.   C4-C5: The disc is normal. There is facet arthropathy on the left.   C5-C6: There is a  mild disc bulge. Facet joints are normal.   C6-C7: There is a mild disc bulge. Facet joints are normal   C7-T1: Normal     Assessment & Plan:   Fibromyalgia  -Intolerance to multiple prior medications -Amitriptyline could be an option to try at a later time, savella could also be an option however she is hesitant to try this due to withdrawal with duloxetine -May consider low dose naltrexone, she declines any medication changes today -Poor tolerance of cymbalta in the past, it did help pain initially  -Prior visit ordered  [employer] Nexwave and cryoheat device, discussed precautions.  She has obtain these devices and plans to try them soon-she has an in clinic with her today -List of foods for pain discussed  -Patient says she is scheduled for physical therapy, she would consider trying aquatic therapy at a later time when it is warmer. -She is currently taking s ketamine -Discussed gradual progressive low impact exercise such as walking -Reviewed Thera cane option again today-she has not tried this yet -Discussed option for shockwave therapy previously -Discussed trying tai chi   Osteoarthritis -Patient reports she has follow-up soon with rheumatology to discuss x-rays and OA versus other conditions further  Hypermobility syndrome, appears more mild  -Joint protection techniques, she can work on this with PT  B/L shoulder pain -OA vs Rotator cuff impingement  -MRI right shoulder completed by orthopedics, Section of Ortho note 04/09/23 " 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 Mild AC arthrosis otherwise unremarkable"   B/L 4th and 5th digit numbness -Tinels positive elbow, she has not been wearing elbow splints regularly, they keep falling off -Patient does not think she could tolerate EMG nerve conduction study.   -Discussed avoiding resting elbow on medial elbow, discussed trying padding or cushion for her elbow when she is doing computer work if limiting  pressure completely is unavoidable  Hx of CTS right -She reports symptoms improved after surgery  Depression, anxiety PTSD -Denies SI or HI -Continue f/u with MH

## 2024-04-12 ENCOUNTER — Encounter: Payer: Self-pay | Admitting: Physical Medicine & Rehabilitation

## 2024-04-12 DIAGNOSIS — M1990 Unspecified osteoarthritis, unspecified site: Secondary | ICD-10-CM

## 2024-04-12 DIAGNOSIS — M797 Fibromyalgia: Secondary | ICD-10-CM

## 2024-04-12 DIAGNOSIS — M357 Hypermobility syndrome: Secondary | ICD-10-CM

## 2024-05-16 ENCOUNTER — Other Ambulatory Visit: Payer: Self-pay

## 2024-05-16 ENCOUNTER — Encounter (HOSPITAL_BASED_OUTPATIENT_CLINIC_OR_DEPARTMENT_OTHER): Payer: Self-pay | Admitting: Physical Therapy

## 2024-05-16 ENCOUNTER — Ambulatory Visit (HOSPITAL_BASED_OUTPATIENT_CLINIC_OR_DEPARTMENT_OTHER): Attending: Physical Medicine & Rehabilitation | Admitting: Physical Therapy

## 2024-05-16 DIAGNOSIS — M5489 Other dorsalgia: Secondary | ICD-10-CM | POA: Diagnosis present

## 2024-05-16 DIAGNOSIS — M357 Hypermobility syndrome: Secondary | ICD-10-CM | POA: Insufficient documentation

## 2024-05-16 DIAGNOSIS — G8929 Other chronic pain: Secondary | ICD-10-CM | POA: Diagnosis present

## 2024-05-16 DIAGNOSIS — M1990 Unspecified osteoarthritis, unspecified site: Secondary | ICD-10-CM | POA: Insufficient documentation

## 2024-05-16 DIAGNOSIS — M6281 Muscle weakness (generalized): Secondary | ICD-10-CM | POA: Diagnosis present

## 2024-05-16 DIAGNOSIS — M797 Fibromyalgia: Secondary | ICD-10-CM | POA: Diagnosis not present

## 2024-05-16 NOTE — Therapy (Signed)
 OUTPATIENT PHYSICAL THERAPY THORACOLUMBAR EVALUATION   Patient Name: Kayla Sanders MRN: [id number] DOB:October 08, 1961, 63 y.o., female Today's Date: 05/16/2024  END OF SESSION:  PT End of Session - 05/16/24 1720     Visit Number 1    Number of Visits 12    Date for PT Re-Evaluation 07/14/24    Authorization Type [employer]    PT Start Time 1620    PT Stop Time 1700    PT Time Calculation (min) 40 min    Activity Tolerance Patient tolerated treatment well    Behavior During Therapy WFL for tasks assessed/performed          Past Medical History:  Diagnosis Date   Anxiety    Anxiety    Asthma    Complication of anesthesia    told by anesthesia had small airway   Family history of adverse reaction to anesthesia    daughter has severe ponv   Fibromyalgia    GERD (gastroesophageal reflux disease)    Hyperlipidemia    Hypertension    Insomnia    Migraines    Mitral valve prolapse    OSA (obstructive sleep apnea)    on CPAP   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 02/03/2021   PONV (postoperative nausea and vomiting)    with general anesthesia severe ponv, does ok with propofol    Vitamin D deficiency    Past Surgical History:  Procedure Laterality Date   49 HOUR PH STUDY  12/10/2021   Procedure: 24 HOUR PH STUDY;  Surgeon: Shila Gustav GAILS, MD;  Location: [facility];  Service: Endoscopy;;   ABDOMINAL HYSTERECTOMY     partial   BIOPSY  06/12/2021   Procedure: BIOPSY;  Surgeon: Shila Gustav GAILS, MD;  Location: [facility];  Service: Endoscopy;;   BRAVO PH STUDY N/A 02/06/2013   Procedure: BRAVO PH STUDY;  Surgeon: Renaye Sous, MD;  Location: [facility];  Service: Endoscopy;  Laterality: N/A;   CARPAL TUNNEL RELEASE Right    CHOLECYSTECTOMY     COLONOSCOPY WITH PROPOFOL  N/A 10/09/2016   Procedure: COLONOSCOPY WITH PROPOFOL ;  Surgeon: Belvie Just, MD;  Location: [facility];  Service: Endoscopy;  Laterality: N/A;   COLONOSCOPY WITH PROPOFOL   N/A 06/12/2021   Procedure: COLONOSCOPY WITH PROPOFOL ;  Surgeon: Shila Gustav GAILS, MD;  Location: [facility];  Service: Endoscopy;  Laterality: N/A;   colonscopy  2012   ESOPHAGEAL MANOMETRY N/A 12/10/2021   Procedure: ESOPHAGEAL MANOMETRY (EM);  Surgeon: Shila Gustav GAILS, MD;  Location: [facility];  Service: Endoscopy;  Laterality: N/A;   ESOPHAGOGASTRODUODENOSCOPY N/A 02/06/2013   Procedure: ESOPHAGOGASTRODUODENOSCOPY (EGD);  Surgeon: Renaye Sous, MD;  Location: [facility];  Service: Endoscopy;  Laterality: N/A;   ESOPHAGOGASTRODUODENOSCOPY (EGD) WITH PROPOFOL  N/A 10/09/2016   Procedure: ESOPHAGOGASTRODUODENOSCOPY (EGD) WITH PROPOFOL ;  Surgeon: Belvie Just, MD;  Location: [facility];  Service: Endoscopy;  Laterality: N/A;   ESOPHAGOGASTRODUODENOSCOPY (EGD) WITH PROPOFOL  N/A 06/12/2021   Procedure: ESOPHAGOGASTRODUODENOSCOPY (EGD) WITH PROPOFOL ;  Surgeon: Shila Gustav GAILS, MD;  Location: [facility];  Service: Endoscopy;  Laterality: N/A;   ESOPHAGOGASTRODUODENOSCOPY (EGD) WITH PROPOFOL  N/A 12/25/2021   Procedure: ESOPHAGOGASTRODUODENOSCOPY (EGD) WITH PROPOFOL ;  Surgeon: Shila Gustav GAILS, MD;  Location: [facility];  Service: Endoscopy;  Laterality: N/A;   HEMORRHOIDECTOMY WITH HEMORRHOID BANDING     HOT HEMOSTASIS N/A 06/12/2021   Procedure: HOT HEMOSTASIS (ARGON PLASMA COAGULATION/BICAP);  Surgeon: Nandigam, Kavitha V, MD;  Location: [facility];  Service: Endoscopy;  Laterality: N/A;   NASAL SEPTUM SURGERY  PH IMPEDANCE STUDY N/A 12/10/2021   Procedure: PH IMPEDANCE STUDY;  Surgeon: Shila Gustav GAILS, MD;  Location: [facility];  Service: Endoscopy;  Laterality: N/A;   POLYPECTOMY  06/12/2021   Procedure: POLYPECTOMY;  Surgeon: Shila Gustav GAILS, MD;  Location: [facility];  Service: Endoscopy;;   RECTOCELE REPAIR     TMJ ARTHROPLASTY     and removal   TUBAL LIGATION     VESICOVAGINAL FISTULA CLOSURE W/ TAH     Patient Active Problem List   Diagnosis Date Noted    Heartburn    Fibromyalgia    Hyperlipidemia    Hypertension    GERD (gastroesophageal reflux disease)    Anxiety    Osteopenia     PCP: Darice Henle  REFERRING PROVIDER:   Urbano Albright, MD    REFERRING DIAG:  M79.7 (ICD-10-CM) - Fibromyalgia  M35.7 (ICD-10-CM) - Hypermobility syndrome  M19.90 (ICD-10-CM) - Osteoarthritis, unspecified osteoarthritis type, unspecified site    Rationale for Evaluation and Treatment: Rehabilitation  THERAPY DIAG:  Other chronic back pain  Muscle weakness (generalized)  ONSET DATE: chronic  SUBJECTIVE:                                                                                                                                                                                           SUBJECTIVE STATEMENT: Have been doing land based therapy which just hurt more at [facility]. I Hurt more in winter. Am looking for exercises I can do that will increase my strength and decrease my pain.  Have had this >30 yrs.  Continue to work but sit too much.  PERTINENT HISTORY:  Family hx of EDS; dysautonomia; POTS  PAIN:  Are you having pain? Yes: NPRS scale: current/ normal 3/10 worst 8/10 Pain location: generalized pain Pain description: ache, sharp Aggravating factors: Life, cold or wet weather Relieving factors: rest  PRECAUTIONS: None  RED FLAGS: None   WEIGHT BEARING RESTRICTIONS: No  FALLS:  Has patient fallen in last 6 months? No  LIVING ENVIRONMENT: Lives with: lives with their spouse Lives in: House/apartment Stairs: No Has following equipment at home: None  OCCUPATION: desk job  PLOF: Independent  PATIENT GOALS: pain relief, improve strength  NEXT MD VISIT: 1-2 months  OBJECTIVE:  Note: Objective measures were completed at Evaluation unless otherwise noted.   PATIENT SURVEYS:  PATIENT SURVEYS: Modified Oswestry:  MODIFIED OSWESTRY DISABILITY SCALE  Date: 05/16/24 Score  Pain intensity 4 =  Pain medication  provides me with little relief from pain.  2. Personal care (washing, dressing, etc.) 2 =  It is painful to take care of myself, and I am  slow and careful.  3. Lifting 4 = I can lift only very light weights  4. Walking 3 =  Pain prevents me from walking more than  mile.  5. Sitting 3 =  Pain prevents me from sitting more than  hour.  6. Standing 3 =  Pain prevents me from standing more than 1/2 hour.  7. Sleeping 3 =  Even when I take pain medication, I sleep less than 4 hours.  8. Social Life 3 =  Pain prevents me from going out very often.  9. Traveling 2 =  My pain restricts my travel over 2 hours.  10. Employment/ Homemaking 2 = I can perform most of my homemaking/job duties, but pain prevents me from performing more physically stressful activities (eg, lifting, vacuuming).  Total 29/50   Interpretation of scores: Score Category Description  0-20% Minimal Disability The patient can cope with most living activities. Usually no treatment is indicated apart from advice on lifting, sitting and exercise  21-40% Moderate Disability The patient experiences more pain and difficulty with sitting, lifting and standing. Travel and social life are more difficult and they may be disabled from work. Personal care, sexual activity and sleeping are not grossly affected, and the patient can usually be managed by conservative means  41-60% Severe Disability Pain remains the main problem in this group, but activities of daily living are affected. These patients require a detailed investigation  61-80% Crippled Back pain impinges on all aspects of the patient's life. Positive intervention is required  81-100% Bed-bound  These patients are either bed-bound or exaggerating their symptoms  Bluford FORBES Zoe DELENA Karon DELENA, et al. Surgery versus conservative management of stable thoracolumbar fracture: the PRESTO feasibility RCT. [place] ([place]): [employer]; 2021 Nov. [facility] Technology Assessment, No.  25.62.) Appendix 3, Oswestry Disability Index category descriptors. Available from: [employer]cz  Minimally Clinically Important Difference (MCID) = 12.8%     COGNITION: Overall cognitive status: Within functional limits for tasks assessed     SENSATION: WFL   POSTURE: rounded shoulders and forward head    LUMBAR ROM:   Full Pain at end range  LOWER EXTREMITY ROM:    wfl  LOWER EXTREMITY MMT:    HD (Lbs) in sitting Right eval Left eval  Hip flexion 29.4 24.9  Hip extension    Hip abduction 19.5 15.6  Hip adduction    Hip internal rotation    Hip external rotation    Knee flexion    Knee extension 24.8 23.5  Ankle dorsiflexion    Ankle plantarflexion    Ankle inversion    Ankle eversion     (Blank rows = not tested)   FUNCTIONAL TESTS:  5 times sit to stand: 15.51 Timed up and go (TUG): 11.05 Item Test date: 05/16/24 Test date:  Test date:   Sitting to standing 3. able to stand independently using hands Insert OPRCBERGREEVAL SmartPhrase at re-test date Insert OPRCBERGREEVAL SmartPhrase at re-test date  2. Standing unsupported 4. able to stand safely for 2 minutes    3. Sitting with back unsupported, feet supported 4. able to sit safely and securely for 2 minutes    4. Standing to sitting 3. controls descent by using hands    5. Pivot transfer  3. able to transfer safely with definite need of hands    6. Standing unsupported with eyes closed 3. able to stand 10 seconds with supervision    7. Standing unsupported with feet together 4. able to  place feet together independently and stand 1 minute safely    8. Reaching forward with outstretched arms while standing 3. can reach forward 12 cm (5 inches)    9. Pick up object from the floor from standing 3. able to pick up slipper but needs supervision    10. Turning to look behind over left and right shoulders while standing 2. turns sideways but only maintains balance    11. Turn 360  degrees 2. able to turn 360 degrees safely but slowly    12. Place alternate foot on step or stool while standing unsupported 2. able to complete 4 steps without aid with supervision    13. Standing unsupported one foot in front 0. loses balance while stepping or standing    14. Standing on one leg 2. able to lift leg independently and hold >= 3 seconds      Total Score 37/56 Total Score /56 Total Score /56    GAIT: Distance walked: 400 ft Assistive device utilized: None Level of assistance: Complete Independence Comments: initiation of gait antalgic  TREATMENT  Eval Self care:Posture and body mechanic instruction; pain management; exercises frequency; activity toleration; transfers; safety                                                                                                                              PATIENT EDUCATION:  Education details: Discussed eval findings, rehab rationale, aquatic program progression/POC and pools in area. Patient is in agreement  Person educated: Patient Education method: Explanation Education comprehension: verbalized understanding  HOME EXERCISE PROGRAM: TBA  ASSESSMENT:  CLINICAL IMPRESSION: Patient is a 63 y.o. f who was seen today for physical therapy evaluation and treatment for fibromyalgia. Says she was just recently dx with OA throughout most of her joints. Normal pain 3/10. Exam finding show as per Lars she is a medium fall risk, 5 x STS test difficulty and safety concerns with transitional movements muscle testing, decrease in le and core strength and ODI falling into the moderate disability index.  She has had other land based PT interventions throughout the last couple of years, most recent increased pain.  She is a good candidate for skilled aquatic therapy intervention and will benefit form the properties of water to improve all areas of deficit, decrease and better manage pain, improving functional mobility and QOL.  OBJECTIVE  IMPAIRMENTS: decreased activity tolerance, decreased balance, decreased endurance, decreased mobility, impaired flexibility, postural dysfunction, and pain.   ACTIVITY LIMITATIONS: carrying, lifting, bending, squatting, sleeping, stairs, transfers, and locomotion level  PARTICIPATION LIMITATIONS: meal prep, cleaning, laundry, shopping, community activity, and yard work  PERSONAL FACTORS: Time since onset of injury/illness/exacerbation are also affecting patient's functional outcome.   REHAB POTENTIAL: Good  CLINICAL DECISION MAKING: Evolving/moderate complexity  EVALUATION COMPLEXITY: Low   GOALS: Goals reviewed with patient? Yes  SHORT TERM GOALS: Target date: 06/11/24 (pt to be on vacation for 2 weeks delaying start  Pt will tolerate  full aquatic sessions consistently without increase in pain and with improving function to demonstrate good toleration and effectiveness of intervention.  Baseline: Goal status: INITIAL  2.  Pt will be instructed on area pool access/handout Baseline:  Goal status: INITIAL    LONG TERM GOALS: Target date: 07/13/24  Pt to improve on ODI by  at least 12.8% (MCID) to demonstrate statistically significant Improvement in function. Baseline:29/50=58%  Goal status: INITIAL  2.  Pt will improve on Berg balance test to >/= 45/56 (MCD 5 points) to demonstrate a decrease in fall risk. Baseline: 37/56 Goal status: INITIAL  3.  Pt will improve on 5 X STS test to <or=  13s (MCID 2.3s)  to demonstrate improving functional lower extremity strength, transitional movements, and balance Baseline: 15.51 Goal status: INITIAL  4.  Pt will be indep with final aquatic HEP for continued management of condition Baseline:  Goal status: INITIAL  5.  Pt will improve strength in hips listed by  up to or > 5 lbs to demonstrate improved overall physical function Baseline:  Goal status: INITIAL  PLAN:  PT FREQUENCY: 2x/week  PT DURATION: 8 weeks  PLANNED  INTERVENTIONS: 97164- PT Re-evaluation, 97750- Physical Performance Testing, 97110-Therapeutic exercises, 97530- Therapeutic activity, 97112- Neuromuscular re-education, 97535- Self Care, [id number]- Manual therapy, [id number]- Gait training, [phone]- Aquatic Therapy, [phone]- Electrical stimulation (unattended), [phone]- Ionotophoresis 4mg /ml Dexamethasone, 20560 (1-2 muscles), 20561 (3+ muscles)- Dry Needling, Patient/Family education, Balance training, Stair training, Taping, DME instructions, Cryotherapy, and Moist heat.  PLAN FOR NEXT SESSION: aquatics: general strengthening, Rom, (pt hypermobile so practice caution with stretching); pain management  Ronal Foots) Fynlee Rowlands MPT 05/16/24 5:48 PM [facility] [address] [place], [place], [address] Phone: [phone]   Fax:  [phone]

## 2024-05-18 ENCOUNTER — Ambulatory Visit (HOSPITAL_BASED_OUTPATIENT_CLINIC_OR_DEPARTMENT_OTHER): Admitting: Physical Therapy

## 2024-05-18 ENCOUNTER — Encounter (HOSPITAL_BASED_OUTPATIENT_CLINIC_OR_DEPARTMENT_OTHER): Payer: Self-pay | Admitting: Physical Therapy

## 2024-05-18 DIAGNOSIS — G8929 Other chronic pain: Secondary | ICD-10-CM

## 2024-05-18 DIAGNOSIS — M6281 Muscle weakness (generalized): Secondary | ICD-10-CM

## 2024-05-18 DIAGNOSIS — M5489 Other dorsalgia: Secondary | ICD-10-CM | POA: Diagnosis not present
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
## 2024-05-24 ENCOUNTER — Ambulatory Visit (HOSPITAL_BASED_OUTPATIENT_CLINIC_OR_DEPARTMENT_OTHER): Payer: Self-pay | Admitting: Physical Therapy

## 2024-05-31 ENCOUNTER — Encounter (HOSPITAL_BASED_OUTPATIENT_CLINIC_OR_DEPARTMENT_OTHER): Payer: Self-pay | Admitting: Physical Therapy

## 2024-05-31 ENCOUNTER — Ambulatory Visit (HOSPITAL_BASED_OUTPATIENT_CLINIC_OR_DEPARTMENT_OTHER): Attending: Physical Medicine & Rehabilitation | Admitting: Physical Therapy

## 2024-05-31 DIAGNOSIS — G8929 Other chronic pain: Secondary | ICD-10-CM | POA: Insufficient documentation

## 2024-05-31 DIAGNOSIS — M6281 Muscle weakness (generalized): Secondary | ICD-10-CM | POA: Insufficient documentation

## 2024-05-31 DIAGNOSIS — M5489 Other dorsalgia: Secondary | ICD-10-CM | POA: Insufficient documentation

## 2024-05-31 NOTE — Therapy (Signed)
 OUTPATIENT PHYSICAL THERAPY THORACOLUMBAR TREATMENT   Patient Name: Kayla Sanders MRN: [id number] DOB:26-Sep-1961, 63 y.o., female Today's Date: 05/31/2024  END OF SESSION:  PT End of Session - 05/31/24 1612     Visit Number 3    Number of Visits 12    Date for PT Re-Evaluation 07/14/24    Authorization Type [employer]    PT Start Time 1615    PT Stop Time 1655    PT Time Calculation (min) 40 min    Activity Tolerance Patient tolerated treatment well    Behavior During Therapy WFL for tasks assessed/performed          Past Medical History:  Diagnosis Date   Anxiety    Anxiety    Asthma    Complication of anesthesia    told by anesthesia had small airway   Family history of adverse reaction to anesthesia    daughter has severe ponv   Fibromyalgia    GERD (gastroesophageal reflux disease)    Hyperlipidemia    Hypertension    Insomnia    Migraines    Mitral valve prolapse    OSA (obstructive sleep apnea)    on CPAP   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 02/03/2021   PONV (postoperative nausea and vomiting)    with general anesthesia severe ponv, does ok with propofol    Vitamin D deficiency    Past Surgical History:  Procedure Laterality Date   56 HOUR PH STUDY  12/10/2021   Procedure: 24 HOUR PH STUDY;  Surgeon: Shila Gustav GAILS, MD;  Location: [facility];  Service: Endoscopy;;   ABDOMINAL HYSTERECTOMY     partial   BIOPSY  06/12/2021   Procedure: BIOPSY;  Surgeon: Shila Gustav GAILS, MD;  Location: [facility];  Service: Endoscopy;;   BRAVO PH STUDY N/A 02/06/2013   Procedure: BRAVO PH STUDY;  Surgeon: Renaye Sous, MD;  Location: [facility];  Service: Endoscopy;  Laterality: N/A;   CARPAL TUNNEL RELEASE Right    CHOLECYSTECTOMY     COLONOSCOPY WITH PROPOFOL  N/A 10/09/2016   Procedure: COLONOSCOPY WITH PROPOFOL ;  Surgeon: Belvie Just, MD;  Location: [facility];  Service: Endoscopy;  Laterality: N/A;   COLONOSCOPY WITH PROPOFOL   N/A 06/12/2021   Procedure: COLONOSCOPY WITH PROPOFOL ;  Surgeon: Shila Gustav GAILS, MD;  Location: [facility];  Service: Endoscopy;  Laterality: N/A;   colonscopy  2012   ESOPHAGEAL MANOMETRY N/A 12/10/2021   Procedure: ESOPHAGEAL MANOMETRY (EM);  Surgeon: Shila Gustav GAILS, MD;  [facility]: [facility];  Service: Endoscopy;  Laterality: N/A;   ESOPHAGOGASTRODUODENOSCOPY N/A 02/06/2013   Procedure: ESOPHAGOGASTRODUODENOSCOPY (EGD);  Surgeon: Renaye Sous, MD;  Location: [facility];  Service: Endoscopy;  Laterality: N/A;   ESOPHAGOGASTRODUODENOSCOPY (EGD) WITH PROPOFOL  N/A 10/09/2016   Procedure: ESOPHAGOGASTRODUODENOSCOPY (EGD) WITH PROPOFOL ;  Surgeon: Belvie Just, MD;  [facility]: [facility];  Service: Endoscopy;  Laterality: N/A;   ESOPHAGOGASTRODUODENOSCOPY (EGD) WITH PROPOFOL  N/A 06/12/2021   Procedure: ESOPHAGOGASTRODUODENOSCOPY (EGD) WITH PROPOFOL ;  Surgeon: Shila Gustav GAILS, MD;  [facility];  Service: Endoscopy;  Laterality: N/A;   ESOPHAGOGASTRODUODENOSCOPY (EGD) WITH PROPOFOL  N/A 12/25/2021   Procedure: ESOPHAGOGASTRODUODENOSCOPY (EGD) WITH PROPOFOL ;  Surgeon: Shila Gustav GAILS, MD;  [facility]: [facility];  Service: Endoscopy;  Laterality: N/A;   HEMORRHOIDECTOMY WITH HEMORRHOID BANDING     HOT HEMOSTASIS N/A 06/12/2021   Procedure: HOT HEMOSTASIS (ARGON PLASMA COAGULATION/BICAP);  Surgeon: Nandigam, Kavitha V, MD;  Location: [facility];  Service: Endoscopy;  Laterality: N/A;   NASAL SEPTUM SURGERY  PH IMPEDANCE STUDY N/A 12/10/2021   Procedure: PH IMPEDANCE STUDY;  Surgeon: Shila Gustav GAILS, MD;  Location: [facility];  Service: Endoscopy;  Laterality: N/A;   POLYPECTOMY  06/12/2021   Procedure: POLYPECTOMY;  Surgeon: Shila Gustav GAILS, MD;  Location: [facility];  Service: Endoscopy;;   RECTOCELE REPAIR     TMJ ARTHROPLASTY     and removal   TUBAL LIGATION     VESICOVAGINAL FISTULA CLOSURE W/ TAH     Patient Active Problem List   Diagnosis Date Noted    Heartburn    Fibromyalgia    Hyperlipidemia    Hypertension    GERD (gastroesophageal reflux disease)    Anxiety    Osteopenia     PCP: Darice Henle  REFERRING PROVIDER:   Urbano Albright, MD    REFERRING DIAG:  M79.7 (ICD-10-CM) - Fibromyalgia  M35.7 (ICD-10-CM) - Hypermobility syndrome  M19.90 (ICD-10-CM) - Osteoarthritis, unspecified osteoarthritis type, unspecified site    Rationale for Evaluation and Treatment: Rehabilitation  THERAPY DIAG:  Other chronic back pain  Muscle weakness (generalized)  ONSET DATE: chronic  SUBJECTIVE:                                                                                                                                                                                           SUBJECTIVE STATEMENT: Pt reports feeling good after first aquatic session. Knees have been hurting today.  POOL ACCESS: currently none, but plans to explore pools in area [facility], [place], [place])   From initial evaluation: Have been doing land based therapy which just hurt more at [employer]. I Hurt more in winter. Am looking for exercises I can do that will increase my strength and decrease my pain.  Have had this >30 yrs.  Continue to work but sit too much.  PERTINENT HISTORY:  Family hx of EDS; dysautonomia; POTS  PAIN:  Are you having pain? Yes: NPRS scale: current 3/10  Pain location: generalized pain Pain description: ache, sharp Aggravating factors: Life, cold or wet weather Relieving factors: rest  PRECAUTIONS: None  RED FLAGS: None   WEIGHT BEARING RESTRICTIONS: No  FALLS:  Has patient fallen in last 6 months? No  LIVING ENVIRONMENT: Lives with: lives with their spouse Lives in: House/apartment Stairs: No Has following equipment at home: None  OCCUPATION: desk job  PLOF: Independent  PATIENT GOALS: pain relief, improve strength  NEXT MD VISIT: 1-2 months  OBJECTIVE:  Note: Objective measures were  completed at Evaluation unless otherwise noted.   PATIENT SURVEYS:  PATIENT SURVEYS: Modified Oswestry:  MODIFIED OSWESTRY DISABILITY SCALE  Date: 05/16/24 Score  Pain intensity  4 =  Pain medication provides me with little relief from pain.  2. Personal care (washing, dressing, etc.) 2 =  It is painful to take care of myself, and I am slow and careful.  3. Lifting 4 = I can lift only very light weights  4. Walking 3 =  Pain prevents me from walking more than  mile.  5. Sitting 3 =  Pain prevents me from sitting more than  hour.  6. Standing 3 =  Pain prevents me from standing more than 1/2 hour.  7. Sleeping 3 =  Even when I take pain medication, I sleep less than 4 hours.  8. Social Life 3 =  Pain prevents me from going out very often.  9. Traveling 2 =  My pain restricts my travel over 2 hours.  10. Employment/ Homemaking 2 = I can perform most of my homemaking/job duties, but pain prevents me from performing more physically stressful activities (eg, lifting, vacuuming).  Total 29/50   Interpretation of scores: Score Category Description  0-20% Minimal Disability The patient can cope with most living activities. Usually no treatment is indicated apart from advice on lifting, sitting and exercise  21-40% Moderate Disability The patient experiences more pain and difficulty with sitting, lifting and standing. Travel and social life are more difficult and they may be disabled from work. Personal care, sexual activity and sleeping are not grossly affected, and the patient can usually be managed by conservative means  41-60% Severe Disability Pain remains the main problem in this group, but activities of daily living are affected. These patients require a detailed investigation  61-80% Crippled Back pain impinges on all aspects of the patient's life. Positive intervention is required  81-100% Bed-bound  These patients are either bed-bound or exaggerating their symptoms  Bluford FORBES Zoe DELENA Karon DELENA, et al. Surgery versus conservative management of stable thoracolumbar fracture: the PRESTO feasibility RCT. [place] ([place]): [employer]; 2021 Nov. [facility] Technology Assessment, No. 25.62.) Appendix 3, Oswestry Disability Index category descriptors. Available from: FindJewel[employer].cz  Minimally Clinically Important Difference (MCID) = 12.8%     COGNITION: Overall cognitive status: Within functional limits for tasks assessed     SENSATION: WFL   POSTURE: rounded shoulders and forward head    LUMBAR ROM:   Full Pain at end range  LOWER EXTREMITY ROM:    wfl  LOWER EXTREMITY MMT:    HD (Lbs) in sitting Right eval Left eval  Hip flexion 29.4 24.9  Hip extension    Hip abduction 19.5 15.6  Hip adduction    Hip internal rotation    Hip external rotation    Knee flexion    Knee extension 24.8 23.5  Ankle dorsiflexion    Ankle plantarflexion    Ankle inversion    Ankle eversion     (Blank rows = not tested)   FUNCTIONAL TESTS:  5 times sit to stand: 15.51 Timed up and go (TUG): 11.05 Item Test date: 05/16/24 Test date:  Test date:   Sitting to standing 3. able to stand independently using hands Insert OPRCBERGREEVAL SmartPhrase at re-test date Insert OPRCBERGREEVAL SmartPhrase at re-test date  2. Standing unsupported 4. able to stand safely for 2 minutes    3. Sitting with back unsupported, feet supported 4. able to sit safely and securely for 2 minutes    4. Standing to sitting 3. controls descent by using hands    5. Pivot transfer  3. able to transfer safely with  definite need of hands    6. Standing unsupported with eyes closed 3. able to stand 10 seconds with supervision    7. Standing unsupported with feet together 4. able to place feet together independently and stand 1 minute safely    8. Reaching forward with outstretched arms while standing 3. can reach forward 12 cm (5 inches)    9. Pick up object from the floor  from standing 3. able to pick up slipper but needs supervision    10. Turning to look behind over left and right shoulders while standing 2. turns sideways but only maintains balance    11. Turn 360 degrees 2. able to turn 360 degrees safely but slowly    12. Place alternate foot on step or stool while standing unsupported 2. able to complete 4 steps without aid with supervision    13. Standing unsupported one foot in front 0. loses balance while stepping or standing    14. Standing on one leg 2. able to lift leg independently and hold >= 3 seconds      Total Score 37/56 Total Score /56 Total Score /56    GAIT: Distance walked: 400 ft Assistive device utilized: None Level of assistance: Complete Independence Comments: initiation of gait antalgic  TREATMENT  [facility] Adult PT Treatment:                                             05/31/24 Pt seen for aquatic therapy today.  Treatment took place in water 3.5-4.75 ft in depth at the [facility] pool. Temp of water was 91.  Pt entered/exited the pool via stairs independently in step-through pattern  with bilat rail.   - unsupported walking forward/ backward 3.6 ft - side stepping with gentle arm addct/ abdct. -straddling noodle with HB for ue support: cycling with ue support and without/breast stroke arms - marching forward/ backward with arm swing -marching with knee kick ue supported and unsupported -L stretch (some shoulder discomfort) - wide and staggered stance with gentle horiz abdct/addct with UE on rainbow hand floats -walking between exercises for rest  Pt requires the buoyancy and hydrostatic pressure of water for support, and to offload joints by unweighting joint load by at least 50 % in navel deep water and by at least 75-80% in chest to neck deep water.  Viscosity of the water is needed for resistance of strengthening. Water current perturbations provides challenge to standing balance requiring increased core activation.                                                                                                                                  PATIENT EDUCATION:  Education details: intro to aquatic therapy   Person educated: Patient Education method: Explanation Education comprehension: verbalized understanding  HOME EXERCISE PROGRAM: Aquatic  This aquatic home exercise program from [employer] utilizes pictures from land based exercises, but has been adapted prior to lamination and issuance.   Access Code: [email or web address] URL: [email or web address] Date: 05/31/2024 Prepared by: Matilda Kohut  Exercises - Cycling on noodle/noodle wrapped under shoulders  - 1 x daily - 1-3 x weekly - Walking March  - 1 x daily - 1-3 x weekly - Standing March with Leg Kick  - 1 x daily - 1-3 x weekly - Standing 'L' Stretch at [employer]  - 1 x daily - 1-3 x weekly - 1 sets - 3 reps - 20 hold - Kick Leg Out To The Side  - 1 x daily - 1-3 x weekly - 1-2 sets - 10 reps - Leg swings flex/ext  - 1 x daily - 1-3 x weekly - 1-2 sets - 10 reps  ASSESSMENT:  CLINICAL IMPRESSION: Assigned initial HEP for pt to take with her and complete at pool over w/e.  Added balance challenges and cycling on noodle for deep end of pool.  Pt instructed on variations of all exercises to increase or decrease difficulty/muscle engagement as well as manage pain.  She does have slight left shoulder and wrist pain with holding to wall while completing exercises which is reduced with releasing position.  She does fatigue by end of session.  Good toleration to addition of exercises.      From initial evaluation: Patient is a 63 y.o. f who was seen today for physical therapy evaluation and treatment for fibromyalgia. Says she was just recently dx with OA throughout most of her joints. Normal pain 3/10. Exam finding show as per Lars she is a medium fall risk, 5 x STS test difficulty and safety concerns with transitional movements muscle  testing, decrease in le and core strength and ODI falling into the moderate disability index.  She has had other land based PT interventions throughout the last couple of years, most recent increased pain.  She is a good candidate for skilled aquatic therapy intervention and will benefit form the properties of water to improve all areas of deficit, decrease and better manage pain, improving functional mobility and QOL.  OBJECTIVE IMPAIRMENTS: decreased activity tolerance, decreased balance, decreased endurance, decreased mobility, impaired flexibility, postural dysfunction, and pain.   ACTIVITY LIMITATIONS: carrying, lifting, bending, squatting, sleeping, stairs, transfers, and locomotion level  PARTICIPATION LIMITATIONS: meal prep, cleaning, laundry, shopping, community activity, and yard work  PERSONAL FACTORS: Time since onset of injury/illness/exacerbation are also affecting patient's functional outcome.   REHAB POTENTIAL: Good  CLINICAL DECISION MAKING: Evolving/moderate complexity  EVALUATION COMPLEXITY: Low   GOALS: Goals reviewed with patient? Yes  SHORT TERM GOALS: Target date: 06/11/24 (pt to be on vacation for 2 weeks delaying start  Pt will tolerate full aquatic sessions consistently without increase in pain and with improving function to demonstrate good toleration and effectiveness of intervention.  Baseline: Goal status: INITIAL  2.  Pt will be instructed on area pool access/handout Baseline:  Goal status: INITIAL    LONG TERM GOALS: Target date: 07/13/24  Pt to improve on ODI by  at least 12.8% (MCID) to demonstrate statistically significant Improvement in function. Baseline:29/50=58%  Goal status: INITIAL  2.  Pt will improve on Berg balance test to >/= 45/56 (MCD 5 points) to demonstrate a decrease in fall risk. Baseline: 37/56 Goal status: INITIAL  3.  Pt will improve on 5 X STS test to <or=  13s (MCID 2.3s)  to demonstrate improving functional lower  extremity strength, transitional movements, and balance Baseline: 15.51 Goal status: INITIAL  4.  Pt will be indep with final aquatic HEP for continued management of condition Baseline:  Goal status: INITIAL  5.  Pt will improve strength in hips listed by  up to or > 5 lbs to demonstrate improved overall physical function Baseline:  Goal status: INITIAL  PLAN:  PT FREQUENCY: 2x/week  PT DURATION: 8 weeks  PLANNED INTERVENTIONS: 97164- PT Re-evaluation, 97750- Physical Performance Testing, 97110-Therapeutic exercises, 97530- Therapeutic activity, 97112- Neuromuscular re-education, 97535- Self Care, [id number]- Manual therapy, [id number]- Gait training, [phone]- Aquatic Therapy, [phone]- Electrical stimulation (unattended), [phone]- Ionotophoresis 4mg /ml Dexamethasone, 20560 (1-2 muscles), 20561 (3+ muscles)- Dry Needling, Patient/Family education, Balance training, Stair training, Taping, DME instructions, Cryotherapy, and Moist heat.  PLAN FOR NEXT SESSION: aquatics: general strengthening, Rom, (pt hypermobile so practice caution with stretching); pain management  Ronal Foots) Mauri Tolen MPT 05/31/24 4:13 PM [facility] [address] [place], [place], [address] Phone: [phone]   Fax:  [phone]

## 2024-06-07 ENCOUNTER — Ambulatory Visit (HOSPITAL_BASED_OUTPATIENT_CLINIC_OR_DEPARTMENT_OTHER): Admitting: Physical Therapy

## 2024-06-09 ENCOUNTER — Ambulatory Visit (HOSPITAL_BASED_OUTPATIENT_CLINIC_OR_DEPARTMENT_OTHER): Admitting: Physical Therapy

## 2024-06-14 ENCOUNTER — Ambulatory Visit (HOSPITAL_BASED_OUTPATIENT_CLINIC_OR_DEPARTMENT_OTHER): Admitting: Physical Therapy

## 2024-06-14 ENCOUNTER — Encounter (HOSPITAL_BASED_OUTPATIENT_CLINIC_OR_DEPARTMENT_OTHER): Payer: Self-pay | Admitting: Physical Therapy

## 2024-06-14 DIAGNOSIS — M6281 Muscle weakness (generalized): Secondary | ICD-10-CM

## 2024-06-14 DIAGNOSIS — G8929 Other chronic pain: Secondary | ICD-10-CM

## 2024-06-14 DIAGNOSIS — M5489 Other dorsalgia: Secondary | ICD-10-CM | POA: Diagnosis not present

## 2024-06-14 NOTE — Therapy (Signed)
 OUTPATIENT PHYSICAL THERAPY THORACOLUMBAR TREATMENT   Patient Name: Kayla Sanders MRN: [id number] DOB:[birth year], 63 y.o., female Today's Date: 06/14/2024  END OF SESSION:  PT End of Session - 06/14/24 0718     Visit Number 4    Number of Visits 12    Date for PT Re-Evaluation 07/14/24    Authorization Type [employer]    PT Start Time 0815    PT Stop Time 0855    PT Time Calculation (min) 40 min    Activity Tolerance Patient tolerated treatment well    Behavior During Therapy WFL for tasks assessed/performed          Past Medical History:  Diagnosis Date   Anxiety    Anxiety    Asthma    Complication of anesthesia    told by anesthesia had small airway   Family history of adverse reaction to anesthesia    daughter has severe ponv   Fibromyalgia    GERD (gastroesophageal reflux disease)    Hyperlipidemia    Hypertension    Insomnia    Migraines    Mitral valve prolapse    OSA (obstructive sleep apnea)    on CPAP   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 02/03/2021   PONV (postoperative nausea and vomiting)    with general anesthesia severe ponv, does ok with propofol    Vitamin D deficiency    Past Surgical History:  Procedure Laterality Date   53 HOUR PH STUDY  12/10/2021   Procedure: 24 HOUR PH STUDY;  Surgeon: Shila Gustav GAILS, MD;  Location: [facility];  Service: Endoscopy;;   ABDOMINAL HYSTERECTOMY     partial   BIOPSY  06/12/2021   Procedure: BIOPSY;  Surgeon: Shila Gustav GAILS, MD;  Location: [facility];  Service: Endoscopy;;   BRAVO PH STUDY N/A 02/06/2013   Procedure: BRAVO PH STUDY;  Surgeon: Renaye Sous, MD;  Location: [facility];  Service: Endoscopy;  Laterality: N/A;   CARPAL TUNNEL RELEASE Right    CHOLECYSTECTOMY     COLONOSCOPY WITH PROPOFOL  N/A 10/09/2016   Procedure: COLONOSCOPY WITH PROPOFOL ;  Surgeon: Belvie Just, MD;  Location: [facility];  Service: Endoscopy;  Laterality: N/A;   COLONOSCOPY WITH PROPOFOL   N/A 06/12/2021   Procedure: COLONOSCOPY WITH PROPOFOL ;  Surgeon: Shila Gustav GAILS, MD;  Location: [facility];  Service: Endoscopy;  Laterality: N/A;   colonscopy  2012   ESOPHAGEAL MANOMETRY N/A 12/10/2021   Procedure: ESOPHAGEAL MANOMETRY (EM);  Surgeon: Shila Gustav GAILS, MD;  Location: [facility];  Service: Endoscopy;  Laterality: N/A;   ESOPHAGOGASTRODUODENOSCOPY N/A 02/06/2013   Procedure: ESOPHAGOGASTRODUODENOSCOPY (EGD);  Surgeon: Renaye Sous, MD;  Location: [facility];  Service: Endoscopy;  Laterality: N/A;   ESOPHAGOGASTRODUODENOSCOPY (EGD) WITH PROPOFOL  N/A 10/09/2016   Procedure: ESOPHAGOGASTRODUODENOSCOPY (EGD) WITH PROPOFOL ;  Surgeon: Belvie Just, MD;  [facility]: [facility];  Service: Endoscopy;  Laterality: N/A;   ESOPHAGOGASTRODUODENOSCOPY (EGD) WITH PROPOFOL  N/A 06/12/2021   Procedure: ESOPHAGOGASTRODUODENOSCOPY (EGD) WITH PROPOFOL ;  Surgeon: Shila Gustav GAILS, MD;  [facility]: [facility];  Service: Endoscopy;  Laterality: N/A;   ESOPHAGOGASTRODUODENOSCOPY (EGD) WITH PROPOFOL  N/A 12/25/2021   Procedure: ESOPHAGOGASTRODUODENOSCOPY (EGD) WITH PROPOFOL ;  Surgeon: Shila Gustav GAILS, MD;  [facility];  Service: Endoscopy;  Laterality: N/A;   HEMORRHOIDECTOMY WITH HEMORRHOID BANDING     HOT HEMOSTASIS N/A 06/12/2021   Procedure: HOT HEMOSTASIS (ARGON PLASMA COAGULATION/BICAP);  Surgeon: Nandigam, Kavitha V, MD;  Location: [facility];  Service: Endoscopy;  Laterality: N/A;   NASAL SEPTUM SURGERY  PH IMPEDANCE STUDY N/A 12/10/2021   Procedure: PH IMPEDANCE STUDY;  Surgeon: Shila Gustav GAILS, MD;  Location: [facility];  Service: Endoscopy;  Laterality: N/A;   POLYPECTOMY  06/12/2021   Procedure: POLYPECTOMY;  Surgeon: Shila Gustav GAILS, MD;  Location: [facility];  Service: Endoscopy;;   RECTOCELE REPAIR     TMJ ARTHROPLASTY     and removal   TUBAL LIGATION     VESICOVAGINAL FISTULA CLOSURE W/ TAH     Patient Active Problem List   Diagnosis Date Noted    Heartburn    Fibromyalgia    Hyperlipidemia    Hypertension    GERD (gastroesophageal reflux disease)    Anxiety    Osteopenia     PCP: Darice Henle  REFERRING PROVIDER:   Urbano Albright, MD    REFERRING DIAG:  M79.7 (ICD-10-CM) - Fibromyalgia  M35.7 (ICD-10-CM) - Hypermobility syndrome  M19.90 (ICD-10-CM) - Osteoarthritis, unspecified osteoarthritis type, unspecified site    Rationale for Evaluation and Treatment: Rehabilitation  THERAPY DIAG:  Other chronic back pain  Muscle weakness (generalized)  ONSET DATE: chronic  SUBJECTIVE:                                                                                                                                                                                           SUBJECTIVE STATEMENT: Pt reports increased pain in neck, knees and shoulder after last session to 8/10.  Today reduced to 2/10  POOL ACCESS: currently none, but plans to explore pools in area [facility], [place], [place])   From initial evaluation: Have been doing land based therapy which just hurt more at [employer]. I Hurt more in winter. Am looking for exercises I can do that will increase my strength and decrease my pain.  Have had this >30 yrs.  Continue to work but sit too much.  PERTINENT HISTORY:  Family hx of EDS; dysautonomia; POTS  PAIN:  Are you having pain? Yes: NPRS scale: current 3/10  Pain location: generalized pain Pain description: ache, sharp Aggravating factors: Life, cold or wet weather Relieving factors: rest  PRECAUTIONS: None  RED FLAGS: None   WEIGHT BEARING RESTRICTIONS: No  FALLS:  Has patient fallen in last 6 months? No  LIVING ENVIRONMENT: Lives with: lives with their spouse Lives in: House/apartment Stairs: No Has following equipment at home: None  OCCUPATION: desk job  PLOF: Independent  PATIENT GOALS: pain relief, improve strength  NEXT MD VISIT: 1-2 months  OBJECTIVE:  Note: Objective  measures were completed at Evaluation unless otherwise noted.   PATIENT SURVEYS:  PATIENT SURVEYS: Modified Oswestry:  MODIFIED OSWESTRY DISABILITY SCALE  Date: 05/16/24 Score  Pain intensity 4 =  Pain medication provides me with little relief from pain.  2. Personal care (washing, dressing, etc.) 2 =  It is painful to take care of myself, and I am slow and careful.  3. Lifting 4 = I can lift only very light weights  4. Walking 3 =  Pain prevents me from walking more than  mile.  5. Sitting 3 =  Pain prevents me from sitting more than  hour.  6. Standing 3 =  Pain prevents me from standing more than 1/2 hour.  7. Sleeping 3 =  Even when I take pain medication, I sleep less than 4 hours.  8. Social Life 3 =  Pain prevents me from going out very often.  9. Traveling 2 =  My pain restricts my travel over 2 hours.  10. Employment/ Homemaking 2 = I can perform most of my homemaking/job duties, but pain prevents me from performing more physically stressful activities (eg, lifting, vacuuming).  Total 29/50   Interpretation of scores: Score Category Description  0-20% Minimal Disability The patient can cope with most living activities. Usually no treatment is indicated apart from advice on lifting, sitting and exercise  21-40% Moderate Disability The patient experiences more pain and difficulty with sitting, lifting and standing. Travel and social life are more difficult and they may be disabled from work. Personal care, sexual activity and sleeping are not grossly affected, and the patient can usually be managed by conservative means  41-60% Severe Disability Pain remains the main problem in this group, but activities of daily living are affected. These patients require a detailed investigation  61-80% Crippled Back pain impinges on all aspects of the patient's life. Positive intervention is required  81-100% Bed-bound  These patients are either bed-bound or exaggerating their symptoms  Bluford FORBES Zoe DELENA Karon DELENA, et al. Surgery versus conservative management of stable thoracolumbar fracture: the PRESTO feasibility RCT. [place] ([place]): [employer]; 2021 Nov. [facility] Technology Assessment, No. 25.62.) Appendix 3, Oswestry Disability Index category descriptors. Available from: FindJewel[employer].cz  Minimally Clinically Important Difference (MCID) = 12.8%     COGNITION: Overall cognitive status: Within functional limits for tasks assessed     SENSATION: WFL   POSTURE: rounded shoulders and forward head    LUMBAR ROM:   Full Pain at end range  LOWER EXTREMITY ROM:    wfl  LOWER EXTREMITY MMT:    HD (Lbs) in sitting Right eval Left eval  Hip flexion 29.4 24.9  Hip extension    Hip abduction 19.5 15.6  Hip adduction    Hip internal rotation    Hip external rotation    Knee flexion    Knee extension 24.8 23.5  Ankle dorsiflexion    Ankle plantarflexion    Ankle inversion    Ankle eversion     (Blank rows = not tested)   FUNCTIONAL TESTS:  5 times sit to stand: 15.51 Timed up and go (TUG): 11.05 Item Test date: 05/16/24 Test date:  Test date:   Sitting to standing 3. able to stand independently using hands Insert OPRCBERGREEVAL SmartPhrase at re-test date Insert OPRCBERGREEVAL SmartPhrase at re-test date  2. Standing unsupported 4. able to stand safely for 2 minutes    3. Sitting with back unsupported, feet supported 4. able to sit safely and securely for 2 minutes    4. Standing to sitting 3. controls descent by using hands    5. Pivot transfer  3. able to transfer safely with definite need of hands    6. Standing unsupported with eyes closed 3. able to stand 10 seconds with supervision    7. Standing unsupported with feet together 4. able to place feet together independently and stand 1 minute safely    8. Reaching forward with outstretched arms while standing 3. can reach forward 12 cm (5 inches)    9. Pick up object  from the floor from standing 3. able to pick up slipper but needs supervision    10. Turning to look behind over left and right shoulders while standing 2. turns sideways but only maintains balance    11. Turn 360 degrees 2. able to turn 360 degrees safely but slowly    12. Place alternate foot on step or stool while standing unsupported 2. able to complete 4 steps without aid with supervision    13. Standing unsupported one foot in front 0. loses balance while stepping or standing    14. Standing on one leg 2. able to lift leg independently and hold >= 3 seconds      Total Score 37/56 Total Score /56 Total Score /56    GAIT: Distance walked: 400 ft Assistive device utilized: None Level of assistance: Complete Independence Comments: initiation of gait antalgic  TREATMENT  [facility] Adult PT Treatment:                                             06/14/24 Pt seen for aquatic therapy today.  Treatment took place in water 3.5-4.75 ft in depth at the [facility] pool. Temp of water was 91.  Pt entered/exited the pool via stairs independently in step-through pattern  with bilat rail.   - unsupported walking forward/ backward 3.6 ft - side stepping with gentle arm addct/ abdct. - marching forward/ backward with arm swing - wide and staggered stance with gentle horiz abdct/addct with UE on rainbow hand floats -bow & Arrow - UE on wall:  toe/heel raises x10; hip add/abd 2x5 ; hip flexion /extension x10 -squatted rest period -chin tucks; right lateral flex -marching with knee kick ue supported and unsupported -walking between exercises for rest  Pt requires the buoyancy and hydrostatic pressure of water for support, and to offload joints by unweighting joint load by at least 50 % in navel deep water and by at least 75-80% in chest to neck deep water.  Viscosity of the water is needed for resistance of strengthening. Water current perturbations provides challenge to standing balance requiring  increased core activation.     PATIENT EDUCATION:  Education details: intro to aquatic therapy   Person educated: Patient Education method: Explanation Education comprehension: verbalized understanding  HOME EXERCISE PROGRAM: Aquatic  This aquatic home exercise program from [employer] utilizes pictures from land based exercises, but has been adapted prior to lamination and issuance.   Access Code: [email or web address] URL: [email or web address] Date: 05/31/2024 Prepared by: Matilda Kohut  Exercises - Cycling on noodle/noodle wrapped under shoulders  - 1 x daily - 1-3 x weekly - Walking March  - 1 x daily - 1-3 x weekly - Standing March with Leg Kick  - 1 x daily - 1-3 x weekly - Standing 'L' Stretch at [employer]  - 1 x daily - 1-3 x weekly - 1 sets - 3 reps - 20 hold - Kick Leg Out To  The Side  - 1 x daily - 1-3 x weekly - 1-2 sets - 10 reps - Leg swings flex/ext  - 1 x daily - 1-3 x weekly - 1-2 sets - 10 reps  ASSESSMENT:  CLINICAL IMPRESSION: Dialed back session eliminating cycling on noodle as well as use of HB for balance support trying to identify and eliminate activity that may have resulted in increase in pain sensitivity post last session. She is provided multiple rest periods throughout.  Visually she tolerates session well with same report. She did trial warm water massage using the Jacuzzi (unbilled). STGs in progress but not yet met. She did not have the opportunity to complete initial aquatic HEP due to her personal pool access being closed.       From initial evaluation: Patient is a 63 y.o. f who was seen today for physical therapy evaluation and treatment for fibromyalgia. Says she was just recently dx with OA throughout most of her joints. Normal pain 3/10. Exam finding show as per Lars she is a medium fall risk, 5 x STS test difficulty and safety concerns with transitional movements muscle testing, decrease in le and core strength and ODI falling into the  moderate disability index.  She has had other land based PT interventions throughout the last couple of years, most recent increased pain.  She is a good candidate for skilled aquatic therapy intervention and will benefit form the properties of water to improve all areas of deficit, decrease and better manage pain, improving functional mobility and QOL.  OBJECTIVE IMPAIRMENTS: decreased activity tolerance, decreased balance, decreased endurance, decreased mobility, impaired flexibility, postural dysfunction, and pain.   ACTIVITY LIMITATIONS: carrying, lifting, bending, squatting, sleeping, stairs, transfers, and locomotion level  PARTICIPATION LIMITATIONS: meal prep, cleaning, laundry, shopping, community activity, and yard work  PERSONAL FACTORS: Time since onset of injury/illness/exacerbation are also affecting patient's functional outcome.   REHAB POTENTIAL: Good  CLINICAL DECISION MAKING: Evolving/moderate complexity  EVALUATION COMPLEXITY: Low   GOALS: Goals reviewed with patient? Yes  SHORT TERM GOALS: Target date: 06/11/24 (pt to be on vacation for 2 weeks delaying start)  Pt will tolerate full aquatic sessions consistently without increase in pain and with improving function to demonstrate good toleration and effectiveness of intervention.  Baseline: Goal status: In progress 06/14/24  2.  Pt will be instructed on area pool access/handout Baseline:  Goal status: In progress 06/14/24    LONG TERM GOALS: Target date: 07/13/24  Pt to improve on ODI by  at least 12.8% (MCID) to demonstrate statistically significant Improvement in function. Baseline:29/50=58%  Goal status: INITIAL  2.  Pt will improve on Berg balance test to >/= 45/56 (MCD 5 points) to demonstrate a decrease in fall risk. Baseline: 37/56 Goal status: INITIAL  3.  Pt will improve on 5 X STS test to <or=  13s (MCID 2.3s)  to demonstrate improving functional lower extremity strength, transitional movements, and  balance Baseline: 15.51 Goal status: INITIAL  4.  Pt will be indep with final aquatic HEP for continued management of condition Baseline:  Goal status: INITIAL  5.  Pt will improve strength in hips listed by  up to or > 5 lbs to demonstrate improved overall physical function Baseline:  Goal status: INITIAL  PLAN:  PT FREQUENCY: 2x/week  PT DURATION: 8 weeks  PLANNED INTERVENTIONS: 97164- PT Re-evaluation, 97750- Physical Performance Testing, 97110-Therapeutic exercises, 97530- Therapeutic activity, [id number]- Neuromuscular re-education, 97535- Self Care, 02859- Manual therapy, [id number]- Gait training, [phone]- Aquatic Therapy, [id number]-  Electrical stimulation (unattended), [id number]- Ionotophoresis 4mg /ml Dexamethasone, 20560 (1-2 muscles), 20561 (3+ muscles)- Dry Needling, Patient/Family education, Balance training, Stair training, Taping, DME instructions, Cryotherapy, and Moist heat.  PLAN FOR NEXT SESSION: aquatics: general strengthening, Rom, (pt hypermobile so practice caution with stretching); pain management  Ronal Foots) Rolan Wrightsman MPT 06/14/24 7:19 AM [facility] [address] [place], [place], [address] Phone: [phone]   Fax:  [phone]

## 2024-06-21 ENCOUNTER — Ambulatory Visit (HOSPITAL_BASED_OUTPATIENT_CLINIC_OR_DEPARTMENT_OTHER): Admitting: Physical Therapy

## 2024-06-21 ENCOUNTER — Encounter (HOSPITAL_BASED_OUTPATIENT_CLINIC_OR_DEPARTMENT_OTHER): Payer: Self-pay | Admitting: Physical Therapy

## 2024-06-21 DIAGNOSIS — M6281 Muscle weakness (generalized): Secondary | ICD-10-CM

## 2024-06-21 DIAGNOSIS — M5489 Other dorsalgia: Secondary | ICD-10-CM | POA: Diagnosis not present

## 2024-06-21 NOTE — Therapy (Signed)
 OUTPATIENT PHYSICAL THERAPY THORACOLUMBAR TREATMENT   Patient Name: Kayla Sanders MRN: [id number] DOB:March 11, 1961, 63 y.o., female Today's Date: 06/21/2024  END OF SESSION:  PT End of Session - 06/21/24 1536     Visit Number 5    Number of Visits 12    Date for PT Re-Evaluation 07/14/24    Authorization Type [employer]    PT Start Time 1531    PT Stop Time 1610    PT Time Calculation (min) 39 min    Activity Tolerance Patient tolerated treatment well    Behavior During Therapy WFL for tasks assessed/performed           Past Medical History:  Diagnosis Date   Anxiety    Anxiety    Asthma    Complication of anesthesia    told by anesthesia had small airway   Family history of adverse reaction to anesthesia    daughter has severe ponv   Fibromyalgia    GERD (gastroesophageal reflux disease)    Hyperlipidemia    Hypertension    Insomnia    Migraines    Mitral valve prolapse    OSA (obstructive sleep apnea)    on CPAP   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 02/03/2021   PONV (postoperative nausea and vomiting)    with general anesthesia severe ponv, does ok with propofol    Vitamin D deficiency    Past Surgical History:  Procedure Laterality Date   77 HOUR PH STUDY  12/10/2021   Procedure: 24 HOUR PH STUDY;  Surgeon: Shila Gustav GAILS, MD;  Location: [facility];  Service: Endoscopy;;   ABDOMINAL HYSTERECTOMY     partial   BIOPSY  06/12/2021   Procedure: BIOPSY;  Surgeon: Shila Gustav GAILS, MD;  Location: [facility];  Service: Endoscopy;;   BRAVO PH STUDY N/A 02/06/2013   Procedure: BRAVO PH STUDY;  Surgeon: Renaye Sous, MD;  Location: [facility];  Service: Endoscopy;  Laterality: N/A;   CARPAL TUNNEL RELEASE Right    CHOLECYSTECTOMY     COLONOSCOPY WITH PROPOFOL  N/A 10/09/2016   Procedure: COLONOSCOPY WITH PROPOFOL ;  Surgeon: Belvie Just, MD;  Location: [facility];  Service: Endoscopy;  Laterality: N/A;   COLONOSCOPY WITH PROPOFOL   N/A 06/12/2021   Procedure: COLONOSCOPY WITH PROPOFOL ;  Surgeon: Shila Gustav GAILS, MD;  Location: [facility];  Service: Endoscopy;  Laterality: N/A;   colonscopy  2012   ESOPHAGEAL MANOMETRY N/A 12/10/2021   Procedure: ESOPHAGEAL MANOMETRY (EM);  Surgeon: Shila Gustav GAILS, MD;  [facility];  Service: Endoscopy;  Laterality: N/A;   ESOPHAGOGASTRODUODENOSCOPY N/A 02/06/2013   Procedure: ESOPHAGOGASTRODUODENOSCOPY (EGD);  Surgeon: Renaye Sous, MD;  [facility];  Service: Endoscopy;  Laterality: N/A;   ESOPHAGOGASTRODUODENOSCOPY (EGD) WITH PROPOFOL  N/A 10/09/2016   Procedure: ESOPHAGOGASTRODUODENOSCOPY (EGD) WITH PROPOFOL ;  Surgeon: Belvie Just, MD;  [facility];  Service: Endoscopy;  Laterality: N/A;   ESOPHAGOGASTRODUODENOSCOPY (EGD) WITH PROPOFOL  N/A 06/12/2021   Procedure: ESOPHAGOGASTRODUODENOSCOPY (EGD) WITH PROPOFOL ;  Surgeon: Shila Gustav GAILS, MD;  [facility];  Service: Endoscopy;  Laterality: N/A;   ESOPHAGOGASTRODUODENOSCOPY (EGD) WITH PROPOFOL  N/A 12/25/2021   Procedure: ESOPHAGOGASTRODUODENOSCOPY (EGD) WITH PROPOFOL ;  Surgeon: Shila Gustav GAILS, MD;  [facility];  Service: Endoscopy;  Laterality: N/A;   HEMORRHOIDECTOMY WITH HEMORRHOID BANDING     HOT HEMOSTASIS N/A 06/12/2021   Procedure: HOT HEMOSTASIS (ARGON PLASMA COAGULATION/BICAP);  Surgeon: Nandigam, Kavitha V, MD;  Location: [facility];  Service: Endoscopy;  Laterality: N/A;   NASAL SEPTUM SURGERY  PH IMPEDANCE STUDY N/A 12/10/2021   Procedure: PH IMPEDANCE STUDY;  Surgeon: Shila Gustav GAILS, MD;  [facility];  Service: Endoscopy;  Laterality: N/A;   POLYPECTOMY  06/12/2021   Procedure: POLYPECTOMY;  Surgeon: Shila Gustav GAILS, MD;  Location: [facility];  Service: Endoscopy;;   RECTOCELE REPAIR     TMJ ARTHROPLASTY     and removal   TUBAL LIGATION     VESICOVAGINAL FISTULA CLOSURE W/ TAH     Patient Active Problem List   Diagnosis Date Noted    Heartburn    Fibromyalgia    Hyperlipidemia    Hypertension    GERD (gastroesophageal reflux disease)    Anxiety    Osteopenia     PCP: Darice Henle  REFERRING PROVIDER:   Urbano Albright, MD    REFERRING DIAG:  M79.7 (ICD-10-CM) - Fibromyalgia  M35.7 (ICD-10-CM) - Hypermobility syndrome  M19.90 (ICD-10-CM) - Osteoarthritis, unspecified osteoarthritis type, unspecified site    Rationale for Evaluation and Treatment: Rehabilitation  THERAPY DIAG:  Other chronic back pain  Muscle weakness (generalized)  ONSET DATE: chronic  SUBJECTIVE:                                                                                                                                                                                           SUBJECTIVE STATEMENT: Pt reports improved toleration to last session.  Bilat knee pain due to OA see ortho soon to address. Will be leaving  POOL ACCESS: currently none, but plans to explore pools in area [facility] [place] [place])   From initial evaluation: Have been doing land based therapy which just hurt more at [employer]. I Hurt more in winter. Am looking for exercises I can do that will increase my strength and decrease my pain.  Have had this >30 yrs.  Continue to work but sit too much.  PERTINENT HISTORY:  Family hx of EDS; dysautonomia; POTS  PAIN:  Are you having pain? Yes: NPRS scale: current 2/10  Pain location: generalized pain Pain description: ache, sharp Aggravating factors: Life, cold or wet weather Relieving factors: rest  PRECAUTIONS: None  RED FLAGS: None   WEIGHT BEARING RESTRICTIONS: No  FALLS:  Has patient fallen in last 6 months? No  LIVING ENVIRONMENT: Lives with: lives with their spouse Lives in: House/apartment Stairs: No Has following equipment at home: None  OCCUPATION: desk job  PLOF: Independent  PATIENT GOALS: pain relief, improve strength  NEXT MD VISIT: 1-2 months  OBJECTIVE:   Note: Objective measures were completed at Evaluation unless otherwise noted.   PATIENT SURVEYS:  PATIENT SURVEYS: Modified Oswestry:  MODIFIED OSWESTRY  DISABILITY SCALE  Date: 05/16/24 Score  Pain intensity 4 =  Pain medication provides me with little relief from pain.  2. Personal care (washing, dressing, etc.) 2 =  It is painful to take care of myself, and I am slow and careful.  3. Lifting 4 = I can lift only very light weights  4. Walking 3 =  Pain prevents me from walking more than  mile.  5. Sitting 3 =  Pain prevents me from sitting more than  hour.  6. Standing 3 =  Pain prevents me from standing more than 1/2 hour.  7. Sleeping 3 =  Even when I take pain medication, I sleep less than 4 hours.  8. Social Life 3 =  Pain prevents me from going out very often.  9. Traveling 2 =  My pain restricts my travel over 2 hours.  10. Employment/ Homemaking 2 = I can perform most of my homemaking/job duties, but pain prevents me from performing more physically stressful activities (eg, lifting, vacuuming).  Total 29/50   Interpretation of scores: Score Category Description  0-20% Minimal Disability The patient can cope with most living activities. Usually no treatment is indicated apart from advice on lifting, sitting and exercise  21-40% Moderate Disability The patient experiences more pain and difficulty with sitting, lifting and standing. Travel and social life are more difficult and they may be disabled from work. Personal care, sexual activity and sleeping are not grossly affected, and the patient can usually be managed by conservative means  41-60% Severe Disability Pain remains the main problem in this group, but activities of daily living are affected. These patients require a detailed investigation  61-80% Crippled Back pain impinges on all aspects of the patient's life. Positive intervention is required  81-100% Bed-bound  These patients are either bed-bound or exaggerating their  symptoms  Bluford FORBES Zoe DELENA Karon DELENA, et al. Surgery versus conservative management of stable thoracolumbar fracture: the PRESTO feasibility RCT. [place]): [employer]; 2021 Nov. [facility] Technology Assessment, No. 25.62.) Appendix 3, Oswestry Disability Index category descriptors. Available from: FindJewel[employer].cz  Minimally Clinically Important Difference (MCID) = 12.8%     COGNITION: Overall cognitive status: Within functional limits for tasks assessed     SENSATION: WFL   POSTURE: rounded shoulders and forward head    LUMBAR ROM:   Full Pain at end range  LOWER EXTREMITY ROM:    wfl  LOWER EXTREMITY MMT:    HD (Lbs) in sitting Right eval Left eval  Hip flexion 29.4 24.9  Hip extension    Hip abduction 19.5 15.6  Hip adduction    Hip internal rotation    Hip external rotation    Knee flexion    Knee extension 24.8 23.5  Ankle dorsiflexion    Ankle plantarflexion    Ankle inversion    Ankle eversion     (Blank rows = not tested)   FUNCTIONAL TESTS:  5 times sit to stand: 15.51 Timed up and go (TUG): 11.05 Item Test date: 05/16/24 Test date:  Test date:   Sitting to standing 3. able to stand independently using hands Insert OPRCBERGREEVAL SmartPhrase at re-test date Insert OPRCBERGREEVAL SmartPhrase at re-test date  2. Standing unsupported 4. able to stand safely for 2 minutes    3. Sitting with back unsupported, feet supported 4. able to sit safely and securely for 2 minutes    4. Standing to sitting 3. controls descent by using hands    5.  Pivot transfer  3. able to transfer safely with definite need of hands    6. Standing unsupported with eyes closed 3. able to stand 10 seconds with supervision    7. Standing unsupported with feet together 4. able to place feet together independently and stand 1 minute safely    8. Reaching forward with outstretched arms while standing 3. can reach forward 12 cm (5 inches)     9. Pick up object from the floor from standing 3. able to pick up slipper but needs supervision    10. Turning to look behind over left and right shoulders while standing 2. turns sideways but only maintains balance    11. Turn 360 degrees 2. able to turn 360 degrees safely but slowly    12. Place alternate foot on step or stool while standing unsupported 2. able to complete 4 steps without aid with supervision    13. Standing unsupported one foot in front 0. loses balance while stepping or standing    14. Standing on one leg 2. able to lift leg independently and hold >= 3 seconds      Total Score 37/56 Total Score /56 Total Score /56    GAIT: Distance walked: 400 ft Assistive device utilized: None Level of assistance: Complete Independence Comments: initiation of gait antalgic  TREATMENT  [facility] Adult PT Treatment:                                             06/21/24 Pt seen for aquatic therapy today.  Treatment took place in water 3.5-4.75 ft in depth at the [facility] pool. Temp of water was 91.  Pt entered/exited the pool via stairs independently in step-through pattern  with bilat rail.   - unsupported walking forward/ backward 3.6 ft - Cycling on noodle/noodle wrapped under shoulders   - Walking March   - Standing March with Leg Kick   - Standing 'L' Stretch at [facility]  - Kick Leg Out To The Side   - Leg swings flex/ext -1/2 hollow noodle pull down wide stance then staggered stances x 5 -walking between exercises for rest  Pt requires the buoyancy and hydrostatic pressure of water for support, and to offload joints by unweighting joint load by at least 50 % in navel deep water and by at least 75-80% in chest to neck deep water.  Viscosity of the water is needed for resistance of strengthening. Water current perturbations provides challenge to standing balance requiring increased core activation.     PATIENT EDUCATION:  Education details: intro to aquatic therapy    Person educated: Patient Education method: Explanation Education comprehension: verbalized understanding  HOME EXERCISE PROGRAM: Aquatic  This aquatic home exercise program from [employer] utilizes pictures from land based exercises, but has been adapted prior to lamination and issuance.   Access Code: [email or web address] URL: [email or web address] Date: 05/31/2024 Prepared by: Matilda Kohut  Exercises - Cycling on noodle/noodle wrapped under shoulders  - 1 x daily - 1-3 x weekly - Walking March  - 1 x daily - 1-3 x weekly - Standing March with Leg Kick  - 1 x daily - 1-3 x weekly - Standing 'L' Stretch at [employer]  - 1 x daily - 1-3 x weekly - 1 sets - 3 reps - 20 hold - Kick Leg Out To The Side  - 1 x daily -  1-3 x weekly - 1-2 sets - 10 reps - Leg swings flex/ext  - 1 x daily - 1-3 x weekly - 1-2 sets - 10 reps  Added to 06/21/24 Exercises - Cycling on noodle/noodle wrapped under shoulders  - 1 x daily - 1-3 x weekly - Walking March  - 1 x daily - 1-3 x weekly - Standing March with Leg Kick  - 1 x daily - 1-3 x weekly - Standing 'L' Stretch at [employer]  - 1 x daily - 1-3 x weekly - 1 sets - 3 reps - 20 hold - Kick Leg Out To The Side  - 1 x daily - 1-3 x weekly - 1-2 sets - 5-10 reps - Leg swings flex/ext  - 1 x daily - 1-3 x weekly - 1-2 sets - 5-10 reps - Standing Shoulder Horizontal Abduction Using Hand buoy  - 1 x daily - 1-3 x weekly - 1-2 sets - 5-10 reps - Noodle press  - 1 x daily - 1-2 x weekly - 1-2 sets - 5 reps  ASSESSMENT:  CLINICAL IMPRESSION: Pt reports good toleration to last session with reduced exercise and added rest periods.  She is leaving town for 2 week.  I updated her aquatic HEP, instructed and laminated.issued it.  She demonstrates and vb understanding and indep.  She has fairly good toleration to session today but does become visibly fatigued so session ended. She is instructed to complete 2-3 x a week as tolerated then report back toleration.   Pt reports she will not have pool access for next few months as she will be traveling on and off.  Will use pools as available during her travels. She has received hand out of available pools in area. All STG met. Good session. Goals ongoing         From initial evaluation: Patient is a 63 y.o. f who was seen today for physical therapy evaluation and treatment for fibromyalgia. Says she was just recently dx with OA throughout most of her joints. Normal pain 3/10. Exam finding show as per Lars she is a medium fall risk, 5 x STS test difficulty and safety concerns with transitional movements muscle testing, decrease in le and core strength and ODI falling into the moderate disability index.  She has had other land based PT interventions throughout the last couple of years, most recent increased pain.  She is a good candidate for skilled aquatic therapy intervention and will benefit form the properties of water to improve all areas of deficit, decrease and better manage pain, improving functional mobility and QOL.  OBJECTIVE IMPAIRMENTS: decreased activity tolerance, decreased balance, decreased endurance, decreased mobility, impaired flexibility, postural dysfunction, and pain.   ACTIVITY LIMITATIONS: carrying, lifting, bending, squatting, sleeping, stairs, transfers, and locomotion level  PARTICIPATION LIMITATIONS: meal prep, cleaning, laundry, shopping, community activity, and yard work  PERSONAL FACTORS: Time since onset of injury/illness/exacerbation are also affecting patient's functional outcome.   REHAB POTENTIAL: Good  CLINICAL DECISION MAKING: Evolving/moderate complexity  EVALUATION COMPLEXITY: Low   GOALS: Goals reviewed with patient? Yes  SHORT TERM GOALS: Target date: 06/11/24 (pt to be on vacation for 2 weeks delaying start)  Pt will tolerate full aquatic sessions consistently without increase in pain and with improving function to demonstrate good toleration and  effectiveness of intervention.  Baseline: Goal status: In progress 06/14/24; Met 06/21/24  2.  Pt will be instructed on area pool access/handout Baseline:  Goal status: In progress 06/14/24;Met 06/21/24  LONG TERM GOALS: Target date: 07/13/24  Pt to improve on ODI by  at least 12.8% (MCID) to demonstrate statistically significant Improvement in function. Baseline:29/50=58%  Goal status: INITIAL  2.  Pt will improve on Berg balance test to >/= 45/56 (MCD 5 points) to demonstrate a decrease in fall risk. Baseline: 37/56 Goal status: INITIAL  3.  Pt will improve on 5 X STS test to <or=  13s (MCID 2.3s)  to demonstrate improving functional lower extremity strength, transitional movements, and balance Baseline: 15.51 Goal status: INITIAL  4.  Pt will be indep with final aquatic HEP for continued management of condition Baseline:  Goal status: INITIAL  5.  Pt will improve strength in hips listed by  up to or > 5 lbs to demonstrate improved overall physical function Baseline:  Goal status: INITIAL  PLAN:  PT FREQUENCY: 2x/week  PT DURATION: 8 weeks  PLANNED INTERVENTIONS: 97164- PT Re-evaluation, 97750- Physical Performance Testing, 97110-Therapeutic exercises, 97530- Therapeutic activity, 97112- Neuromuscular re-education, 97535- Self Care, 02859- Manual therapy, [id number]- Gait training, [phone]- Aquatic Therapy, [phone]- Electrical stimulation (unattended), [phone]- Ionotophoresis 4mg /ml Dexamethasone, 20560 (1-2 muscles), 20561 (3+ muscles)- Dry Needling, Patient/Family education, Balance training, Stair training, Taping, DME instructions, Cryotherapy, and Moist heat.  PLAN FOR NEXT SESSION: aquatics: general strengthening, Rom, (pt hypermobile so practice caution with stretching); pain management  Ronal Foots) Margorie Renner MPT 06/21/24 3:45 PM [facility] [address] [place], [place], [address] Phone: [phone]   Fax:   [phone]

## 2024-06-23 ENCOUNTER — Ambulatory Visit (HOSPITAL_BASED_OUTPATIENT_CLINIC_OR_DEPARTMENT_OTHER): Admitting: Physical Therapy

## 2024-06-23 ENCOUNTER — Ambulatory Visit (HOSPITAL_BASED_OUTPATIENT_CLINIC_OR_DEPARTMENT_OTHER): Payer: Self-pay | Admitting: Physical Therapy

## 2024-06-27 ENCOUNTER — Ambulatory Visit (HOSPITAL_BASED_OUTPATIENT_CLINIC_OR_DEPARTMENT_OTHER): Admitting: Physical Therapy

## 2024-06-29 ENCOUNTER — Ambulatory Visit (HOSPITAL_BASED_OUTPATIENT_CLINIC_OR_DEPARTMENT_OTHER): Admitting: Physical Therapy

## 2024-07-04 ENCOUNTER — Ambulatory Visit (HOSPITAL_BASED_OUTPATIENT_CLINIC_OR_DEPARTMENT_OTHER): Admitting: Physical Therapy

## 2024-07-06 ENCOUNTER — Ambulatory Visit (HOSPITAL_BASED_OUTPATIENT_CLINIC_OR_DEPARTMENT_OTHER): Admitting: Physical Therapy

## 2024-07-11 ENCOUNTER — Encounter (HOSPITAL_BASED_OUTPATIENT_CLINIC_OR_DEPARTMENT_OTHER): Payer: Self-pay | Admitting: Physical Therapy

## 2024-07-11 ENCOUNTER — Ambulatory Visit (HOSPITAL_BASED_OUTPATIENT_CLINIC_OR_DEPARTMENT_OTHER): Attending: Physical Medicine & Rehabilitation | Admitting: Physical Therapy

## 2024-07-11 DIAGNOSIS — M5489 Other dorsalgia: Secondary | ICD-10-CM | POA: Insufficient documentation

## 2024-07-11 DIAGNOSIS — G8929 Other chronic pain: Secondary | ICD-10-CM | POA: Diagnosis present

## 2024-07-11 DIAGNOSIS — M6281 Muscle weakness (generalized): Secondary | ICD-10-CM | POA: Diagnosis present

## 2024-07-11 NOTE — Therapy (Addendum)
 OUTPATIENT PHYSICAL THERAPY THORACOLUMBAR TREATMENT PHYSICAL THERAPY DISCHARGE SUMMARY  Visits from Start of Care: 6  Current functional level related to goals / functional outcomes: indep   Remaining deficits: Chronic pain   Education / Equipment: Management of condition/HEP   Patient agrees to discharge. Patient goals were partially met. Patient is being discharged due to being pleased with the current functional level.  Addend Ronal Kem) Ziemba MPT 08/11/24 8:16 AM [facility] [address] [place], [place], [address] Phone: [phone]   Fax:  [phone]    Patient Name: Kayla Sanders MRN: [id number] DOB:Jun 13, 1961, 63 y.o., female Today's Date: 07/11/2024  END OF SESSION:  PT End of Session - 07/11/24 0715     Visit Number 6    Number of Visits 12    Date for PT Re-Evaluation 07/14/24    Authorization Type U[employer]    PT Start Time 0715    PT Stop Time 0753    PT Time Calculation (min) 38 min           Past Medical History:  Diagnosis Date   Anxiety    Anxiety    Asthma    Complication of anesthesia    told by anesthesia had small airway   Family history of adverse reaction to anesthesia    daughter has severe ponv   Fibromyalgia    GERD (gastroesophageal reflux disease)    Hyperlipidemia    Hypertension    Insomnia    Migraines    Mitral valve prolapse    OSA (obstructive sleep apnea)    on CPAP   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 02/03/2021   PONV (postoperative nausea and vomiting)    with general anesthesia severe ponv, does ok with propofol    Vitamin D deficiency    Past Surgical History:  Procedure Laterality Date   77 HOUR PH STUDY  12/10/2021   Procedure: 24 HOUR PH STUDY;  Surgeon: Shila Gustav GAILS, MD;  Location: [facility];  Service: Endoscopy;;   ABDOMINAL HYSTERECTOMY     partial   BIOPSY  06/12/2021   Procedure: BIOPSY;  Surgeon:  Shila Gustav GAILS, MD;  Location: [facility];  Service: Endoscopy;;   BRAVO PH STUDY N/A 02/06/2013   Procedure: BRAVO PH STUDY;  Surgeon: Renaye Sous, MD;  Location: [facility];  Service: Endoscopy;  Laterality: N/A;   CARPAL TUNNEL RELEASE Right    CHOLECYSTECTOMY     COLONOSCOPY WITH PROPOFOL  N/A 10/09/2016   Procedure: COLONOSCOPY WITH PROPOFOL ;  Surgeon: Belvie Just, MD;  Location: [facility];  Service: Endoscopy;  Laterality: N/A;   COLONOSCOPY WITH PROPOFOL  N/A 06/12/2021   Procedure: COLONOSCOPY WITH PROPOFOL ;  Surgeon: Shila Gustav GAILS, MD;  Location: [facility];  Service: Endoscopy;  Laterality: N/A;   colonscopy  2012   ESOPHAGEAL MANOMETRY N/A 12/10/2021   Procedure: ESOPHAGEAL MANOMETRY (EM);  Surgeon: Shila Gustav GAILS, MD;  Location: [facility];  Service: Endoscopy;  Laterality: N/A;   ESOPHAGOGASTRODUODENOSCOPY N/A 02/06/2013   Procedure: ESOPHAGOGASTRODUODENOSCOPY (EGD);  Surgeon: Renaye Sous, MD;  Location: [facility];  Service: Endoscopy;  Laterality: N/A;   ESOPHAGOGASTRODUODENOSCOPY (EGD) WITH PROPOFOL  N/A 10/09/2016   Procedure: ESOPHAGOGASTRODUODENOSCOPY (EGD) WITH PROPOFOL ;  Surgeon: Belvie Just, MD;  [facility]: [facility];  Service: Endoscopy;  Laterality: N/A;   ESOPHAGOGASTRODUODENOSCOPY (EGD) WITH PROPOFOL  N/A 06/12/2021   Procedure: ESOPHAGOGASTRODUODENOSCOPY (EGD) WITH PROPOFOL ;  Surgeon: Shila Gustav GAILS, MD;  Location: [facility];  Service: Endoscopy;  Laterality: N/A;   ESOPHAGOGASTRODUODENOSCOPY (  EGD) WITH PROPOFOL  N/A 12/25/2021   Procedure: ESOPHAGOGASTRODUODENOSCOPY (EGD) WITH PROPOFOL ;  Surgeon: Shila Gustav GAILS, MD;  [facility]: [facility];  Service: Endoscopy;  Laterality: N/A;   HEMORRHOIDECTOMY WITH HEMORRHOID BANDING     HOT HEMOSTASIS N/A 06/12/2021   Procedure: HOT HEMOSTASIS (ARGON PLASMA COAGULATION/BICAP);  Surgeon: Nandigam, Kavitha V, MD;  Location: [facility];  Service: Endoscopy;  Laterality: N/A;   NASAL SEPTUM  SURGERY     PH IMPEDANCE STUDY N/A 12/10/2021   Procedure: PH IMPEDANCE STUDY;  Surgeon: Shila Gustav GAILS, MD;  [facility]: [facility];  Service: Endoscopy;  Laterality: N/A;   POLYPECTOMY  06/12/2021   Procedure: POLYPECTOMY;  Surgeon: Shila Gustav GAILS, MD;  Location: [facility];  Service: Endoscopy;;   RECTOCELE REPAIR     TMJ ARTHROPLASTY     and removal   TUBAL LIGATION     VESICOVAGINAL FISTULA CLOSURE W/ TAH     Patient Active Problem List   Diagnosis Date Noted   Heartburn    Fibromyalgia    Hyperlipidemia    Hypertension    GERD (gastroesophageal reflux disease)    Anxiety    Osteopenia     PCP: Darice Henle  REFERRING PROVIDER:   Urbano Albright, MD    REFERRING DIAG:  M79.7 (ICD-10-CM) - Fibromyalgia  M35.7 (ICD-10-CM) - Hypermobility syndrome  M19.90 (ICD-10-CM) - Osteoarthritis, unspecified osteoarthritis type, unspecified site    Rationale for Evaluation and Treatment: Rehabilitation  THERAPY DIAG:  Other chronic back pain  Muscle weakness (generalized)  ONSET DATE: chronic  SUBJECTIVE:                                                                                                                                                                                           SUBJECTIVE STATEMENT: Pt reports she has completed HEP at various pools this summer.  Is requesting a few more exercises for HEP.    POOL ACCESS: currently none, but plans to explore pools in area [facility], [place], [place])   From initial evaluation: Have been doing land based therapy which just hurt more at [employer]. I Hurt more in winter. Am looking for exercises I can do that will increase my strength and decrease my pain.  Have had this >30 yrs.  Continue to work but sit too much.  PERTINENT HISTORY:  Family hx of EDS; dysautonomia; POTS  PAIN:  Are you having pain? Yes: NPRS scale: current 3/10  Pain location: generalized pain (knees, back) Pain  description: ache, sharp Aggravating factors: Life, cold or wet weather Relieving factors: rest  PRECAUTIONS: None  RED FLAGS: None   WEIGHT BEARING RESTRICTIONS:  No  FALLS:  Has patient fallen in last 6 months? No  LIVING ENVIRONMENT: Lives with: lives with their spouse Lives in: House/apartment Stairs: No Has following equipment at home: None  OCCUPATION: desk job  PLOF: Independent  PATIENT GOALS: pain relief, improve strength  NEXT MD VISIT: 1-2 months  OBJECTIVE:  Note: Objective measures were completed at Evaluation unless otherwise noted.   PATIENT SURVEYS:  PATIENT SURVEYS: Modified Oswestry:  MODIFIED OSWESTRY DISABILITY SCALE  Date: 05/16/24 Score  Pain intensity 4 =  Pain medication provides me with little relief from pain.  2. Personal care (washing, dressing, etc.) 2 =  It is painful to take care of myself, and I am slow and careful.  3. Lifting 4 = I can lift only very light weights  4. Walking 3 =  Pain prevents me from walking more than  mile.  5. Sitting 3 =  Pain prevents me from sitting more than  hour.  6. Standing 3 =  Pain prevents me from standing more than 1/2 hour.  7. Sleeping 3 =  Even when I take pain medication, I sleep less than 4 hours.  8. Social Life 3 =  Pain prevents me from going out very often.  9. Traveling 2 =  My pain restricts my travel over 2 hours.  10. Employment/ Homemaking 2 = I can perform most of my homemaking/job duties, but pain prevents me from performing more physically stressful activities (eg, lifting, vacuuming).  Total 29/50  07/11/24: ODI= 20/50=40%   Interpretation of scores: Score Category Description  0-20% Minimal Disability The patient can cope with most living activities. Usually no treatment is indicated apart from advice on lifting, sitting and exercise  21-40% Moderate Disability The patient experiences more pain and difficulty with sitting, lifting and standing. Travel and social life are more  difficult and they may be disabled from work. Personal care, sexual activity and sleeping are not grossly affected, and the patient can usually be managed by conservative means  41-60% Severe Disability Pain remains the main problem in this group, but activities of daily living are affected. These patients require a detailed investigation  61-80% Crippled Back pain impinges on all aspects of the patient's life. Positive intervention is required  81-100% Bed-bound  These patients are either bed-bound or exaggerating their symptoms  Bluford FORBES Zoe DELENA Karon DELENA, et al. Surgery versus conservative management of stable thoracolumbar fracture: the PRESTO feasibility RCT. [place]): [employer]; 2021 Nov. [facility] Technology Assessment, No. 25.62.) Appendix 3, Oswestry Disability Index category descriptors. Available from: FindJewel[employer].cz  Minimally Clinically Important Difference (MCID) = 12.8%     COGNITION: Overall cognitive status: Within functional limits for tasks assessed     SENSATION: WFL   POSTURE: rounded shoulders and forward head    LUMBAR ROM:   Full Pain at end range  LOWER EXTREMITY ROM:    wfl  LOWER EXTREMITY MMT:    HD (Lbs) in sitting Right eval Left eval  Hip flexion 29.4 24.9  Hip extension    Hip abduction 19.5 15.6  Hip adduction    Hip internal rotation    Hip external rotation    Knee flexion    Knee extension 24.8 23.5  Ankle dorsiflexion    Ankle plantarflexion    Ankle inversion    Ankle eversion     (Blank rows = not tested)   FUNCTIONAL TESTS:  5 times sit to stand: 15.51 Timed up and go (TUG): 11.05 07/11/24:  5x STS- 11.90sec  Item Test date: 05/16/24    Sitting to standing 3. able to stand independently using hands    2. Standing unsupported 4. able to stand safely for 2 minutes    3. Sitting with back unsupported, feet supported 4. able to sit safely and securely for 2 minutes    4.  Standing to sitting 3. controls descent by using hands    5. Pivot transfer  3. able to transfer safely with definite need of hands    6. Standing unsupported with eyes closed 3. able to stand 10 seconds with supervision    7. Standing unsupported with feet together 4. able to place feet together independently and stand 1 minute safely    8. Reaching forward with outstretched arms while standing 3. can reach forward 12 cm (5 inches)    9. Pick up object from the floor from standing 3. able to pick up slipper but needs supervision    10. Turning to look behind over left and right shoulders while standing 2. turns sideways but only maintains balance    11. Turn 360 degrees 2. able to turn 360 degrees safely but slowly    12. Place alternate foot on step or stool while standing unsupported 2. able to complete 4 steps without aid with supervision    13. Standing unsupported one foot in front 0. loses balance while stepping or standing    14. Standing on one leg 2. able to lift leg independently and hold >= 3 seconds      Total Score 37/56      GAIT: Distance walked: 400 ft Assistive device utilized: None Level of assistance: Complete Independence Comments: initiation of gait antalgic  TREATMENT  [facility] Adult PT Treatment:                                             07/11/24 Pt seen for aquatic therapy today.  Treatment took place in water 3.5-4.75 ft in depth at the [facility] pool. Temp of water was 91.  Pt entered/exited the pool via stairs independently in step-through pattern  with bilat rail.   - unsupported walking forward/ backward 3.6 ft - side stepping with arm add/abdct with short hollow noodles - staggered stance with horz abdct/add with short hollow noodles - UE support on short hollow noodles: leg swings into hip flex/extension x 7 each LE - UE On wall:  hip abdct/add 2 x5; hip flex/extension x 5 each LE - Walking March  with alternating row motion with short hollow  noodles - suitcase carry with bil and single short hollow noodle under water at side  - Standing 'L' Stretch at [employer]  -1/2 hollow noodle pull down wide stance then staggered stances x 5 -walking between exercises for rest - staggered stance kick board row x 10 each side  Pt requires the buoyancy and hydrostatic pressure of water for support, and to offload joints by unweighting joint load by at least 50 % in navel deep water and by at least 75-80% in chest to neck deep water.  Viscosity of the water is needed for resistance of strengthening. Water current perturbations provides challenge to standing balance requiring increased core activation.     PATIENT EDUCATION:  Education details:  aquatic therapy exercise modifications/ progressions    Person educated: Patient Education method: Explanation, demo, hand out  Education comprehension: verbalized understanding  HOME EXERCISE PROGRAM:     AQUATIC Access Code: [email or web address] URL: [email or web address] This aquatic home exercise program from Med[employer] utilizes pictures from land based exercises, but has been adapted prior to lamination and issuance.  ASSESSMENT:  CLINICAL IMPRESSION: Pt demonstrated improved 5x STS; has met LTG 3.  ODI has improved to 40%.  Pt tolerated session well.  Updated HEP and reissued copy.  Pt is pleased with current level of function and verbalized readiness to d/c at this time. She has partially met her goals.          From initial evaluation: Patient is a 63 y.o. f who was seen today for physical therapy evaluation and treatment for fibromyalgia. Says she was just recently dx with OA throughout most of her joints. Normal pain 3/10. Exam finding show as per Lars she is a medium fall risk, 5 x STS test difficulty and safety concerns with transitional movements muscle testing, decrease in le and core strength and ODI falling into the moderate disability index.  She has had other land based PT  interventions throughout the last couple of years, most recent increased pain.  She is a good candidate for skilled aquatic therapy intervention and will benefit form the properties of water to improve all areas of deficit, decrease and better manage pain, improving functional mobility and QOL.  OBJECTIVE IMPAIRMENTS: decreased activity tolerance, decreased balance, decreased endurance, decreased mobility, impaired flexibility, postural dysfunction, and pain.   ACTIVITY LIMITATIONS: carrying, lifting, bending, squatting, sleeping, stairs, transfers, and locomotion level  PARTICIPATION LIMITATIONS: meal prep, cleaning, laundry, shopping, community activity, and yard work  PERSONAL FACTORS: Time since onset of injury/illness/exacerbation are also affecting patient's functional outcome.   REHAB POTENTIAL: Good  CLINICAL DECISION MAKING: Evolving/moderate complexity  EVALUATION COMPLEXITY: Low   GOALS: Goals reviewed with patient? Yes  SHORT TERM GOALS: Target date: 06/11/24 (pt to be on vacation for 2 weeks delaying start)  Pt will tolerate full aquatic sessions consistently without increase in pain and with improving function to demonstrate good toleration and effectiveness of intervention.  Baseline: Goal status:  Met 06/21/24  2.  Pt will be instructed on area pool access/handout Baseline:  Goal status:Met 06/21/24    LONG TERM GOALS: Target date: 07/13/24  Pt to improve on ODI by  at least 12.8% (MCID) to demonstrate statistically significant Improvement in function. Baseline:29/50=58% at eval.  20/50=40% Goal status: MET- 07/11/24  2.  Pt will improve on Berg balance test to >/= 45/56 (MCD 5 points) to demonstrate a decrease in fall risk. Baseline: 37/56 Goal status:Not tested -07/11/24  3.  Pt will improve on 5 X STS test to <or=  13s (MCID 2.3s)  to demonstrate improving functional lower extremity strength, transitional movements, and balance Baseline: 15.51 Goal status:  MET- 07/11/24  4.  Pt will be indep with final aquatic HEP for continued management of condition Baseline:  Goal status: MET- 07/11/24  5.  Pt will improve strength in hips listed by  up to or > 5 lbs to demonstrate improved overall physical function Baseline:  Goal status: Not Tested- 07/11/24  PLAN:  PT FREQUENCY: 2x/week  PT DURATION: 8 weeks  PLANNED INTERVENTIONS: 97164- PT Re-evaluation, 97750- Physical Performance Testing, 97110-Therapeutic exercises, 97530- Therapeutic activity, 97112- Neuromuscular re-education, 97535- Self Care, 02859- Manual therapy, [id number]- Gait training, [phone]- Aquatic Therapy, [phone]- Electrical stimulation (unattended), [phone]- Ionotophoresis 4mg /ml Dexamethasone, 79439 (1-2 muscles), 20561 (3+ muscles)- Dry Needling, Patient/Family education, Balance training, Stair training, Taping, DME  instructions, Cryotherapy, and Moist heat.  Delon Aquas, PTA 07/11/24 8:33 AM [facility] [address] [place], [place], [address] Phone: [phone]   Fax:  [phone]

## 2024-07-13 ENCOUNTER — Ambulatory Visit (HOSPITAL_BASED_OUTPATIENT_CLINIC_OR_DEPARTMENT_OTHER): Admitting: Physical Therapy

## 2024-08-16 ENCOUNTER — Ambulatory Visit (INDEPENDENT_AMBULATORY_CARE_PROVIDER_SITE_OTHER): Admitting: Gastroenterology

## 2024-08-16 ENCOUNTER — Encounter: Payer: Self-pay | Admitting: Gastroenterology

## 2024-08-16 VITALS — BP 132/84 | HR 66 | Ht 60.0 in | Wt 164.8 lb

## 2024-08-16 DIAGNOSIS — Z860100 Personal history of colon polyps, unspecified: Secondary | ICD-10-CM

## 2024-08-16 DIAGNOSIS — K580 Irritable bowel syndrome with diarrhea: Secondary | ICD-10-CM | POA: Diagnosis not present

## 2024-08-16 DIAGNOSIS — K449 Diaphragmatic hernia without obstruction or gangrene: Secondary | ICD-10-CM | POA: Diagnosis not present

## 2024-08-16 DIAGNOSIS — E66811 Obesity, class 1: Secondary | ICD-10-CM

## 2024-08-16 DIAGNOSIS — K2100 Gastro-esophageal reflux disease with esophagitis, without bleeding: Secondary | ICD-10-CM

## 2024-08-16 DIAGNOSIS — R1013 Epigastric pain: Secondary | ICD-10-CM

## 2024-08-16 DIAGNOSIS — Z6832 Body mass index (BMI) 32.0-32.9, adult: Secondary | ICD-10-CM

## 2024-08-16 MED ORDER — DEXLANSOPRAZOLE 60 MG PO CPDR: 60.0000 mg | DELAYED_RELEASE_CAPSULE | Freq: Every day | ORAL | 5 refills | Status: DC

## 2024-08-16 MED ORDER — FAMOTIDINE 40 MG PO TABS: 40.0000 mg | ORAL_TABLET | Freq: Every day | ORAL | 5 refills | Status: AC

## 2024-08-16 MED ORDER — SUCRALFATE 1 GM/10ML PO SUSP: 1.0000 g | Freq: Four times a day (QID) | ORAL | 3 refills | Status: DC | PRN

## 2024-08-16 NOTE — Progress Notes (Signed)
 Chief Complaint:    GERD, medication refill  GI History:  Kayla Sanders is a 63 y.o. female with a history of anxiety, asthma, fibromyalgia, HLD, HTN, insomnia, migraines, TMJ dysfunction, mitral valve prolapse, OSA, postop nausea, cholecystectomy, abdominal hysterectomy with vesicovaginal fistula closure, rectocele repair, TMJ arthroplasty, hemorrhoidectomy with banding, nasal septum surgery, initially referred to me by Dr. Shila on 01/28/2022 for evaluation of possible antireflux intervention with Transoral Incisionless Fundoplication (TIF) with a goal to stop or significantly reduce acid suppression therapy.   Long standing hx of GERD since teenage years, with multiple meds over the years as below. Started Dexilant  about 10 years ago.    Currently taking Dexilant  every morning, Pepcid  in the evening, and Carafate  prn.    Evaluated by Dr. Cameron at [facility] in 12/2021 for consideration of HHR and antireflux surgery.  Recommended either robotic hiatal hernia repair with 270 degree fundoplication vs concomitant hiatal hernia repair and TIF.   GERD history: -Index symptoms: Heartburn, regurgitation, throat clearing, NCCP -Exacerbating features: Tomato-based sauces, acidic foods, wine -Medications trialed: Prilosec, Prevacid, Protonix, Zegerid, Capadex, Nexium, Dexilant , Pepcid , Carafate  -Current medications: Dexilant  60 mg/day, Pepcid  40 mg  at bedtime, Carafate  prn every 6H -Complications: hiatal hernia, erosive esophagitis, overlapping functional heartburn   GERD evaluation: - Last EGD: 12/2021 - UGI series: 09/2021: Small HH, moderate gastroesophageal reflux, no stricture.  Normal motility -Esophageal Manometry: 11/2021: Normal -pH/Impedance: 11/2021 (on PPI): DeMeester score 0.5, pH <4 in 0% of time, very few reflux episodes.  No symptom correlation based on SAP c/w appropriate acid suppression therapy on medication -Bravo: Failed study 2014   Endoscopic History: - EGD (01/2013): Widely  patent GEJ.  Gastric polyps.  Normal Z-line.  Bravo placed but immediately dislodged - EGD (09/2016): Normal esophagus.  Empiric dilation with 16 mm Savary.  Normal stomach and duodenum. - EGD (05/2021): LA Grade C esophagitis with bleeding esophageal ulcers, gastric erosions, duodenal ulcers - EGD (12/2021): Normal Z-line, normal esophagus, Hill grade 2 valve.  Normal stomach and duodenum   - Colonoscopy (05/2021): Small cecal AVM treated with APC, 1 mm appendiceal orifice polyp, 4 mm transverse colon adenoma, internal/external hemorrhoids.  Recommended repeat in 5 years    HPI:     Patient is a 63 y.o. female presenting to the [facility]astroenterology Clinic for follow-up.  Was last seen by me on 10/28/2023.  Previously discussed trialing venlafaxine for possible functional heartburn overlap, but she wanted to discuss this with her Psychiatrist first.  Continues to have reflux symptoms to include heartburn and dyspepsia.  Taking Dexilant  60 mg daily and Pepcid  40 mg at night as prescribed, along with Carafate  on demand.  Requesting refills of all 3 medications. Also using Gas-X or Beano for gas sensation.   Previously prescribed rifaximin  for IBS-D in 10/2023, but she has not yet tried this.    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     albuterol (VENTOLIN HFA) 108 (90 Base) MCG/ACT inhaler Inhale 2 puffs into the lungs every 4 (four) hours as needed for shortness of breath.     ALPRAZolam  (XANAX ) 1 MG tablet Take 1 mg by mouth at bedtime.     Azelastine HCl 137 MCG/SPRAY SOLN Place 1 spray into both nostrils daily as needed for allergies.     buPROPion (WELLBUTRIN XL) 150 MG 24 hr tablet Take 1 tab daily (in addition to a single 300mg )     carboxymethylcellulose (REFRESH PLUS) 0.5 % SOLN Place 1 drop into both eyes 3 (three) times daily as needed (dry eyes).     DAYVIGO 10 MG TABS Take 1 tablet by mouth at bedtime.     dexlansoprazole  (DEXILANT ) 60 MG capsule Take 1 capsule (60 mg total) by mouth daily. 90 capsule 3   diclofenac  Sodium (VOLTAREN  ARTHRITIS PAIN) 1 % GEL Apply 2 g topically 4 (four) times daily. 150 g 3   famotidine  (PEPCID ) 40 MG tablet 1 tablet as needed by oral route.     fexofenadine (ALLEGRA) 180 MG tablet Take 180 mg by mouth every evening.     fluconazole (DIFLUCAN) 100 MG tablet Take 100 mg by mouth daily as needed (yeast). For yeast at caesarean scar.     fluticasone  (FLONASE ) 50 MCG/ACT nasal spray 2 sprays nightly.     hydrocortisone-pramoxine (ANALPRAM-HC) 2.5-1 % rectal cream Place 1 application  rectally daily as needed for hemorrhoids.     ipratropium (ATROVENT) 0.03 % nasal spray Place 1 spray into both nostrils 2 (two) times daily.     levothyroxine (SYNTHROID) 25 MCG tablet Take 25 mcg by mouth daily.     metoprolol  succinate (TOPROL -XL) 25 MG 24 hr tablet Take 1 tablet (25 mg total) by mouth daily. 90 tablet 3   montelukast  (SINGULAIR ) 10 MG tablet Take 1 tablet (10 mg total) by mouth at bedtime. 90 tablet 3   nystatin cream (MYCOSTATIN) Apply 1  application topically 2 (two) times daily as needed (yeast).     olmesartan  (BENICAR ) 20 MG tablet Take 1 tablet (20 mg total) by mouth daily. 90 tablet 3   olopatadine (PATANOL) 0.1 % ophthalmic solution Place 1 drop into both eyes 2 (two) times daily as needed for allergies.     PRESCRIPTION MEDICATION Place 1 application vaginally 2 (two) times a week. Estradiol ellage 0.02%, 0.2mg /57ml     Propylene Glycol (SYSTANE COMPLETE PF OP) Place 1-2 drops into both eyes 2 (two) times daily.     rosuvastatin  (CRESTOR ) 5 MG tablet Take 1 tablet (5 mg total) by mouth daily. 90 tablet 3   SPRAVATO, 84 MG DOSE, 28 MG/DEVICE SOPK      sucralfate  (CARAFATE ) 1 GM/10ML suspension Take 10 mLs (1 g total) by mouth 4 (four) times daily as needed. 3600 mL 3   terconazole (TERAZOL 3) 0.8 % vaginal cream INSERT 1 APPLICATORFUL VAGINALLY DAILY FOR 3 DAYS AT BEDTIME     triamcinolone cream (KENALOG) 0.1 % APPLY TOPICALLY TO THE AFFECTED  AREA 3 TIMES EVERY WEEK     valACYclovir (VALTREX) 500 MG tablet Take 500 mg by mouth daily.     Vitamin D, Ergocalciferol, (DRISDOL) 1.25 MG (50000 UNIT) CAPS capsule Take 50,000 Units by mouth every Wednesday.     No current facility-administered medications for this visit.    Physical Exam:     BP 132/84   Pulse 66   Ht 5' (1.524 m)   Wt 164 lb 12.8 oz (74.8 kg)   BMI 32.19 kg/m   GENERAL:  Kayla female in NAD PSYCH: : Cooperative, normal affect NEURO: Alert and oriented x 3, no focal neurologic deficits   IMPRESSION and PLAN:    1) GERD with erosive esophagitis 2) Hiatal hernia 3) Heartburn 4) Dyspepsia - Resume Dexilant . Refill placed today. Did disucss role/utility of Voquenza if Dexilant  not working in the future as below - Resume Pepcid  20 mg prn at bedtime. Refill placed today - Carafate  liquid refill placed  - Previously discussed role/utility of antireflux surgery with cTIF in the future if she prefers.  As previously discussed, fundoplication could  control her reflux symptoms well, but could still have ongoing heartburn from potential functional overlap - We again discussed the role/utility of TCA and SNRI therapy for possible functional heartburn overlap/functional dyspepsia overlap.  She would again like to think this over and discuss with her Psychiatrist before initiating TCA or SNRI therapy -She is considering starting ASA 81 mg for cardiac prophylaxis along with consideration of NSAIDs for arthritis.  Given history of PUD and erosive esophagitis, will plan on EGD now to ensure no active UGI pathology that would be a contraindication to either/both of those therapies - If active erosive esophagitis on EGD despite Dexilant  and Pepcid , will change Dexilant  to Voquenza     5) IBS with urgency - Low FODMAP diet - Start Rifaximin  as previously prescribed - Start probiotic - Duodenal biopsy at time of EGD as above    6) History of colon polyps 7) MUTYH gene mutation - Due for repeat colonoscopy in 2027   8) Obesity - Patient considering starting GLP-1. Discussed GI ADR profile with her today.      The indications, risks, and benefits of EGD were explained to the patient in detail. Risks include but are not limited to bleeding, perforation, adverse reaction to medications, and cardiopulmonary compromise. Sequelae include but are not limited to the possibility of surgery, hospitalization, and mortality. The patient verbalized understanding and wished to proceed. All questions answered, referred to scheduler. Further recommendations pending results of the exam.       Sandor GAILS Joffrey Kerce ,DO, FACG 08/16/2024, 10:06 AM

## 2024-08-16 NOTE — Patient Instructions (Addendum)
 _______________________________________________________  If your blood pressure at your visit was 140/90 or greater, please contact your primary care physician to follow up on this.  _______________________________________________________  If you are age 63 or older, your body mass index should be between 23-30. Your Body mass index is 32.19 kg/m. If this is out of the aforementioned range listed, please consider follow up with your Primary Care Provider.  If you are age 14 or younger, your body mass index should be between 19-25. Your Body mass index is 32.19 kg/m. If this is out of the aformentioned range listed, please consider follow up with your Primary Care Provider.   ________________________________________________________  The [facility] providers would like to encourage you to use MYCHART to communicate with providers for non-urgent requests or questions.  Due to long hold times on the telephone, sending your provider a message by [facility] may be a faster and more efficient way to get a response.  Please allow 48 business hours for a response.  Please remember that this is for non-urgent requests.  _______________________________________________________  [facility] is using a team-based approach to care.  Your team is made up of your doctor and two to three APPS. Our APPS (Nurse Practitioners and Physician Assistants) work with your physician to ensure care continuity for you. They are fully qualified to address your health concerns and develop a treatment plan. They communicate directly with your gastroenterologist to care for you. Seeing the Advanced Practice Practitioners on your physician's team can help you by facilitating care more promptly, often allowing for earlier appointments, access to diagnostic testing, procedures, and other specialty referrals.   We have sent the following medications to your pharmacy for you to pick up at your convenience:  Dexilant  60mg   one capsule daily Pepcid  40mg  one tablet at bedtime each night Carafate  1gm every 6 hours as needed.  You have been scheduled for an endoscopy. Please follow written instructions given to you at your visit today.  If you use inhalers (even only as needed), please bring them with you on the day of your procedure.  If you take any of the following medications, they will need to be adjusted prior to your procedure:   DO NOT TAKE 7 DAYS PRIOR TO TEST- Trulicity (dulaglutide) Ozempic, Wegovy (semaglutide) Mounjaro (tirzepatide) Bydureon Bcise (exanatide extended release)  DO NOT TAKE 1 DAY PRIOR TO YOUR TEST Rybelsus (semaglutide) Adlyxin (lixisenatide) Victoza (liraglutide) Byetta (exanatide) ___________________________________________________________________________  Due to recent changes in healthcare laws, you may see the results of your imaging and laboratory studies on MyChart before your provider has had a chance to review them.  We understand that in some cases there may be results that are confusing or concerning to you. Not all laboratory results come back in the same time frame and the provider may be waiting for multiple results in order to interpret others.  Please give us  48 hours in order for your provider to thoroughly review all the results before contacting the office for clarification of your results.  It was a pleasure to see you today!  Vito Cirigliano, D.O.

## 2024-08-18 ENCOUNTER — Ambulatory Visit: Admitting: Physical Medicine & Rehabilitation

## 2024-08-29 ENCOUNTER — Telehealth: Payer: Self-pay | Admitting: Adult Health

## 2024-08-29 NOTE — Telephone Encounter (Signed)
 Patient said on vacation in [place] and do not have any distilled water to use in CPAP. Have not used since 08/24/24. Will be back home on 09/04/24, will not be able to use CPAP until the 09/04/24. They suggested could boil the tap water and use that. Would like a call back.

## 2024-08-30 NOTE — Telephone Encounter (Signed)
 LVM made patient aware can use tap water for a brief time, but just make sure rinse  out the water chamber every day and let air dry. Please contact DME about boiling water unsure about that

## 2024-09-07 ENCOUNTER — Encounter: Payer: Self-pay | Admitting: Gastroenterology

## 2024-09-08 ENCOUNTER — Other Ambulatory Visit: Payer: Self-pay

## 2024-09-08 ENCOUNTER — Other Ambulatory Visit (HOSPITAL_COMMUNITY): Payer: Self-pay

## 2024-09-08 ENCOUNTER — Ambulatory Visit: Admitting: Physical Medicine & Rehabilitation

## 2024-09-08 NOTE — Telephone Encounter (Signed)
 Sucralfate  does not require a prior authorization. Insurance will only pay for a maximum of 30 day supply. #1280mL per 30 days co-pay is $0.00. Famotidine  is a plan exclusion and not covered since it is available over the counter.

## 2024-09-25 ENCOUNTER — Encounter: Payer: Self-pay | Admitting: Gastroenterology

## 2024-10-02 ENCOUNTER — Encounter: Payer: Self-pay | Admitting: Gastroenterology

## 2024-10-02 ENCOUNTER — Ambulatory Visit: Admitting: Gastroenterology

## 2024-10-02 ENCOUNTER — Telehealth: Payer: Self-pay

## 2024-10-02 VITALS — BP 133/78 | HR 66 | Temp 97.3°F | Ht 60.0 in | Wt 164.0 lb

## 2024-10-02 DIAGNOSIS — K449 Diaphragmatic hernia without obstruction or gangrene: Secondary | ICD-10-CM

## 2024-10-02 DIAGNOSIS — K2100 Gastro-esophageal reflux disease with esophagitis, without bleeding: Secondary | ICD-10-CM

## 2024-10-02 MED ORDER — SODIUM CHLORIDE 0.9 % IV SOLN: 500.0000 mL | Freq: Once | INTRAVENOUS | Status: AC

## 2024-10-02 NOTE — Progress Notes (Signed)
 Patient presented for EGD today preoperative evaluation for potential antireflux surgery.  Unfortunately, she has a history of difficult airway and procedure cannot be done in the outpatient [facility].  Procedure will be canceled today and scheduled for EGD to be done at [facility].   Sandor Flatter, DO, [facility]

## 2024-10-02 NOTE — Telephone Encounter (Signed)
-----   Message from Sandor LULLA Flatter sent at 10/02/2024 11:08 AM EST ----- Patient came in for EGD today, but unfortunately hx of difficult airway and procedure cancelled today. Needs to be scheduled for EGD with me at the hospital. Hopefully we can get done before end of the year since she has met her deductible, etc.   Any spots with me at [facility] or [facility]?

## 2024-10-02 NOTE — Progress Notes (Signed)
 1020  Review patient history and patient states she is a difficult airway and was told she needs to wear a bracelet to alert everyone to airway problems in the past by [employer].  Case canceled.  MDA and case was in the 90s.

## 2024-10-03 NOTE — Telephone Encounter (Signed)
 Your next possible slot is 11/21/24. You currently have 6 procedures scheduled. OK to add EGD?

## 2024-10-04 ENCOUNTER — Other Ambulatory Visit: Payer: Self-pay | Admitting: Physician Assistant

## 2024-10-04 ENCOUNTER — Other Ambulatory Visit: Payer: Self-pay

## 2024-10-04 DIAGNOSIS — K449 Diaphragmatic hernia without obstruction or gangrene: Secondary | ICD-10-CM

## 2024-10-04 DIAGNOSIS — K2100 Gastro-esophageal reflux disease with esophagitis, without bleeding: Secondary | ICD-10-CM

## 2024-10-04 NOTE — Progress Notes (Signed)
 [facility] [address] [place]. [place] [address] [phone]       OFFICE FOLLOW UP NOTE  [name] Date of Birth:  1961-07-05 Medical Record Number:  [id number]    Primary neurologist: Dr. Buck Reason for visit: CPAP follow-up    SUBJECTIVE:   CHIEF COMPLAINT:  Chief Complaint  Patient presents with   Obstructive Sleep Apnea    RM3. Pt is here Alone. Pt states that everything is okay with her CPAP Machine.     HPI:   Update 10/05/2024 JM: Patient returns for follow-up visit after prior visit over 2 years ago.  After prior visit, pressure setting initially changed to set pressure of 17 but residual AHI remained elevated therefore further increased to 18 in 09/2022.  Repeat download after 30 days shows optimal residual AHI of 4.3.  She was switched to AutoPap 5-15 back in March for unclear reason, further investigated with DME who reported order received from PCP requesting change. Patient was unaware this was requested by PCP. She continues to struggle with CPCP, feels like her mask is going to blow off her face at times and can also have increased bloating at times after CPAP use. Using F30i mask. Is a mouth breather. Still has not noticed any significant benefit in regards to sleep quality or daytime energy levels with CPAP.  ESS 12/24           History provided for reference purposes Update 08/12/2022 JM: Patient returns for 20-month CPAP follow-up.  At prior visits, AHI remains elevated despite adequate compliance therefore pursued titration study back in June which showed good improvement/near resolution of apnea under pressure of 16.  She has remained on this pressure setting but residual AHI remains elevated at 9.9.  She is concerned regarding continued elevated AHI that she can see this information on her app. At times, can feel like the mask is going to blow off her face due to the pressure. She continues to feel daytime fatigue and  sleepiness and is notably frustrated about this.  She questions if there is any other treatment options.  Epworth Sleepiness Scale 10/24.  Fatigue severity scale 48/63.        Update 02/04/2022 JM: 63 year old female with history of sleep apnea and new onset exertional headaches.  Recently seen by Dr. Buck 11/27/2021, received a new CPAP machine and returns for initial CPAP compliance visit.  Review of compliance report as below which shows optimal usage >4 hours at 87% although elevated AHI at 10.3.  She continues to have difficulty tolerating pressure as she feels like it is puffing out her cheeks, swallowing air, causing extreme dry mouth and feels like the seal of her mask is not adequate. She reports trialing multiple different masks, she does report increased comfort with her full face new mask (different brand) but is still having leaks with any type of movement. This will wake her up as well as wake her husband up.   Epworth Sleepiness Scale 12/24 Fatigue severity scale 52/63  She has not had any additional exertional headaches. Completed MR brain w/wo and MRA head due to new onset exertional headaches which were both unremarkable.           Copied from Dr. Obie prior OV note Today, 11/27/2021: She has a history of migraine headaches, but reports that this was years ago and they improved as she went through menopause.  Some 5 years ago she had 1 episode of ocular migraine with  a significant visual aura and a milder headache.  She has never really been on any prescription medication for migraines.  She had an episode of severe migraine after sexual activity some 6 months ago and then again about 2 months ago, overall she had only these 2 events.  She had no strokelike symptoms such as one-sided weakness or numbness or tingling or droopy face or slurring of speech.  She has experienced other new symptoms in the interim including balance issues, feeling foggy headed, concern for  Ehlers-Danlos syndrome as 1 daughter has been diagnosed with this condition and another daughter has symptoms similar to EDS. patient primary care within the last 2 months.  She has been using her old CPAP machine.  She has not quite started with a new machine.  I was able to review her CPAP compliance data from 10/28/2021 through 11/26/2021, which is a total of 30 days, during which time she used her machine every night except for 1 night, percent use days greater than 4 hours at 87%, indicating very good compliance, average usage of 5 hours and 30 minutes, residual AHI elevated at 11.1/h without breakdown whether she has more obstructive or central events on this report.  Leak acceptable with a 95th percentile at 7.7 L/min on a pressure of 15 cm with EPR of 2.  She reports that she does not always sleep well.  Sometimes she pulls off the mask in the middle of the night and the pressure does seem high for her.  She is willing to try to get an updated machine which may help us  get a more detailed breakdown on her residual events and help fine-tune her treatment.  She has an updated eye examination yearly.  Her last eye examination was less than a year ago.      Previously:    01/13/21: (She) was previously diagnosed with obstructive sleep apnea.  I had evaluated her in 2016 for her sleep disorder.  She was advised to proceed with a sleep study.  She did not have a sleep test through our office at the time. She had a subsequent split-night sleep study through [facility] on 08/1916 and the study was interpreted by Dr. Reggy Salt.  AHI at baseline was 17.1/h, O2 nadir 88%.  She was titrated on CPAP with an optimal pressure of 12 cm.  She had a consultation with ENT, Dr. Mable in December 2017 and surgical intervention was not recommended at the time.    She has been using the Amara view full facemask from [employer].  She does have to tighten it quite a bit.  Last year she had to buy supplies  online.  She has changed DME companies over time, first she was with [facility], then [facility], now with [facility] but has not received any supplies from [facility] yet.  She has had significant stressors lately. Sadly, she lost her son about a week ago.  Her daughters who live in [place]  are currently staying with her.  Patient is having a difficult time.  She also lost her mom last year in June.  She was mom's caretaker, she had end-stage lung cancer which was diagnosed in February 2021, Mom lived in [place]  and patient stayed with her between February and June 2021 until her passing.   She has trouble sleeping currently which is understandable due to her stress.  She does not have a very set schedule right now.  She is trying to use her machine  consistently and I was able to review her compliance data from 12/15/2020 through 01/12/2021 which is a total of 29 days, during which time she used her machine every night with an average usage of 6 hours and 20 minutes, residual AHI elevated at 12.8, breakdown of the events is not available, not sure if her residual events are mostly or exclusively obstructive versus central's.  CPAP pressure of 12 cm with EPR of 2.  She has a mild leak, 95th percentile is 2.4 L/min.  She reports no night to night nocturia but has woken up with a headache at times.  Epworth sleepiness score is 12 out of 24, fatigue severity score is 15 out of 63.     11/11/15: 63 year old right-handed woman with an underlying medical history of asthma, vitamin D deficiency, seasonal allergies, hyperlipidemia, hypertension, mitral valve prolapse, reflux disease, migraines, anxiety, obesity and fibromyalgia, who reports snoring and excessive daytime somnolence.  I reviewed your office note from 10/08/2015, which you kindly included. Of note, she had a previous sleep study at [facility] on 12/30/2001 which showed very mild obstructive sleep apnea and light snoring with normal oxygenation according  to the report. RDI was 8 per hour. She had no PLMS. I reviewed the report. Her weight was 148 lb at the time (today at 169 lb).   She reports an approximately 20 pound weight gain this year. She has had a lot of stress. Her daughter has been diagnosed with autonomic dysfunction and her father has advanced dementia. Her daughter lives in [place] and her parents live in [place] . She has been traveling back and forth to [place] in [place]  a lot this year. Both parents have sleep apnea and uses CPAP machine, her mother has recently changed to oxygen only as she needs to hear her father at night. The patient reports morning headaches occasionally. She has no significant nocturia. She tries to keep a bedtime of 11 PM but has trouble going to sleep and staying asleep. She has been taking Xanax  or clonazepam  at night for sleep. She has previously tried Ambien, Lunesta, and Rozerem for sleep with limited or no success. 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 She quit smoking in 1983, she does not drink alcohol regularly and drinks about 3 cups of coffee per day, latest at lunch. She does not typically drink sodas or tea. She lives at home with her husband. She has 1 son and 2 daughters, all grown. She denies restless leg symptoms or leg twitching at night. Her Epworth sleepiness score is 12 out of 24 today, her fatigue score is 45 out of 63 today.       ROS:   14 system review of systems performed and negative with exception of those listed in HPI  PMH:  Past Medical History:  Diagnosis Date   Anxiety    Anxiety    Arthritis    Asthma    Complication of anesthesia    told by anesthesia had small airway   Depression    Difficult airway for intubation    Patient states she is a difficult airway and was told she needs to wear a bracelet to alert everyone to airway problems in the past by MDA.  MDA and case was in the 90s.   Family history of adverse reaction to anesthesia     daughter has severe ponv   Fibromyalgia    GERD (gastroesophageal reflux disease)    Hyperlipidemia  Hypertension    Insomnia    Migraines    Mitral valve prolapse    OSA (obstructive sleep apnea)    on CPAP   Oxygen deficiency    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 02/03/2021   PONV (postoperative nausea and vomiting)    with general anesthesia severe ponv, does ok with propofol    Sleep apnea    Thyroid  disease    Vitamin D deficiency     PSH:  Past Surgical History:  Procedure Laterality Date   34 HOUR PH STUDY  12/10/2021   Procedure: 24 HOUR PH STUDY;  Surgeon: Shila Gustav GAILS, MD;  Location: [facility];  Service: Endoscopy;;   ABDOMINAL HYSTERECTOMY     partial   BIOPSY  06/12/2021   Procedure: BIOPSY;  Surgeon: Shila Gustav GAILS, MD;  Location: [facility];  Service: Endoscopy;;   BRAVO PH STUDY N/A 02/06/2013   Procedure: BRAVO PH STUDY;  Surgeon: Renaye Sous, MD;  Location: [facility];  Service: Endoscopy;  Laterality: N/A;   CARPAL TUNNEL RELEASE Right    CHOLECYSTECTOMY     COLONOSCOPY WITH PROPOFOL  N/A 10/09/2016   Procedure: COLONOSCOPY WITH PROPOFOL ;  Surgeon: Belvie Just, MD;  Location: [facility];  Service: Endoscopy;  Laterality: N/A;   COLONOSCOPY WITH PROPOFOL  N/A 06/12/2021   Procedure: COLONOSCOPY WITH PROPOFOL ;  Surgeon: Shila Gustav GAILS, MD;  Location: [facility];  Service: Endoscopy;  Laterality: N/A;   colonscopy  2012   ESOPHAGEAL MANOMETRY N/A 12/10/2021   Procedure: ESOPHAGEAL MANOMETRY (EM);  Surgeon: Shila Gustav GAILS, MD;  Location: [facility];  Service: Endoscopy;  Laterality: N/A;   ESOPHAGOGASTRODUODENOSCOPY N/A 02/06/2013   Procedure: ESOPHAGOGASTRODUODENOSCOPY (EGD);  Surgeon: Renaye Sous, MD;  [facility]: [facility];  Service: Endoscopy;  Laterality: N/A;   ESOPHAGOGASTRODUODENOSCOPY (EGD) WITH PROPOFOL  N/A 10/09/2016   Procedure: ESOPHAGOGASTRODUODENOSCOPY (EGD) WITH PROPOFOL ;  Surgeon:  Belvie Just, MD;  [facility]: [facility];  Service: Endoscopy;  Laterality: N/A;   ESOPHAGOGASTRODUODENOSCOPY (EGD) WITH PROPOFOL  N/A 06/12/2021   Procedure: ESOPHAGOGASTRODUODENOSCOPY (EGD) WITH PROPOFOL ;  Surgeon: Shila Gustav GAILS, MD;  Location: [facility];  Service: Endoscopy;  Laterality: N/A;   ESOPHAGOGASTRODUODENOSCOPY (EGD) WITH PROPOFOL  N/A 12/25/2021   Procedure: ESOPHAGOGASTRODUODENOSCOPY (EGD) WITH PROPOFOL ;  Surgeon: Shila Gustav GAILS, MD;  [facility];  Service: Endoscopy;  Laterality: N/A;   HEMORRHOIDECTOMY WITH HEMORRHOID BANDING     HOT HEMOSTASIS N/A 06/12/2021   Procedure: HOT HEMOSTASIS (ARGON PLASMA COAGULATION/BICAP);  Surgeon: Nandigam, Kavitha V, MD;  Location: [facility];  Service: Endoscopy;  Laterality: N/A;   NASAL SEPTUM SURGERY     PH IMPEDANCE STUDY N/A 12/10/2021   Procedure: PH IMPEDANCE STUDY;  Surgeon: Shila Gustav GAILS, MD;  [facility]: [facility];  Service: Endoscopy;  Laterality: N/A;   POLYPECTOMY  06/12/2021   Procedure: POLYPECTOMY;  Surgeon: Shila Gustav GAILS, MD;  Location: [facility];  Service: Endoscopy;;   RECTOCELE REPAIR     TMJ ARTHROPLASTY     and removal   TUBAL LIGATION     VESICOVAGINAL FISTULA CLOSURE W/ TAH      Social History:  Social History   Socioeconomic History   Marital status: Married    Spouse name: Not on file   Number of children: 3   Years of education: BS   Highest education level: Not on file  Occupational History   Occupation: [employer]  Tobacco Use   Smoking status: Former    Current packs/day: 0.00    Types: Cigarettes    Quit date:  11/23/1981    Years since quitting: 42.8   Smokeless tobacco: Never  Vaping Use   Vaping status: Never Used  Substance and Sexual Activity   Alcohol use: Never    Comment: Rare, then zero for ages   Drug use: No   Sexual activity: Yes  Other Topics Concern   Not on file  Social History Narrative   Married with grown children. Works for [employer] as  a nurse, children's. Nonsmoker, quit in 1983. No Etoh.   Consumes about 3+ caffeine drinks a day    Social Drivers of Corporate Investment Banker Strain: Not on file  Food Insecurity: Not on file  Transportation Needs: Not on file  Physical Activity: Not on file  Stress: Not on file  Social Connections: Unknown (04/05/2022)   [facility]   Social Network    Social Network: Not on file  Intimate Partner Violence: Unknown (02/25/2022)   Received from [facility]   HITS    Physically Hurt: Not on file    Insult or Talk Down To: Not on file    Threaten Physical Harm: Not on file    Scream or Curse: Not on file    Family History:  Family History  Problem Relation Age of Onset   Arthritis Mother    Hyperlipidemia Mother    Hypertension Mother    Sleep apnea Mother    Colon cancer Mother 55   Lung cancer Mother    Osteopenia Mother    Arthritis Father    Hyperlipidemia Father    Heart disease Father    Stroke Father    Hypertension Father    Sleep apnea Father    Osteopenia Father    Colon polyps Brother    Sleep apnea Brother    Colon polyps Brother    Atrial fibrillation Brother    Lung cancer Maternal Aunt    Osteoporosis Maternal Aunt    Severe combined immunodeficiency Maternal Aunt    Pancreatic cancer Maternal Uncle    Breast cancer Paternal Aunt    Arthritis Other    Breast cancer Other    Diabetes Other    Liver disease Neg Hx    Esophageal cancer Neg Hx    Stomach cancer Neg Hx    Rectal cancer Neg Hx     Medications:   Current Outpatient Medications on File Prior to Visit  Medication Sig Dispense Refill   acetaminophen (TYLENOL) 500 MG tablet Take 500 mg by mouth at bedtime.     albuterol (VENTOLIN HFA) 108 (90 Base) MCG/ACT inhaler Inhale 2 puffs into the lungs every 4 (four) hours as needed for shortness of breath.     ALPRAZolam  (XANAX ) 1 MG tablet Take 1 mg by mouth at bedtime.     Azelastine HCl 137 MCG/SPRAY SOLN Place 1 spray  into both nostrils daily as needed for allergies.     buPROPion (WELLBUTRIN XL) 150 MG 24 hr tablet Take 1 tab daily (in addition to a single 300mg )     carboxymethylcellulose (REFRESH PLUS) 0.5 % SOLN Place 1 drop into both eyes 3 (three) times daily as needed (dry eyes).     dexlansoprazole  (DEXILANT ) 60 MG capsule Take 1 capsule (60 mg total) by mouth daily. 90 capsule 5   diclofenac  Sodium (VOLTAREN  ARTHRITIS PAIN) 1 % GEL Apply 2 g topically 4 (four) times daily. 150 g 3   famotidine  (PEPCID ) 40 MG tablet Take 1 tablet (40 mg total) by mouth  at bedtime. 90 tablet 5   fexofenadine (ALLEGRA) 180 MG tablet Take 180 mg by mouth every evening.     fluconazole (DIFLUCAN) 100 MG tablet Take 100 mg by mouth daily as needed (yeast). For yeast at caesarean scar.     fluticasone  (FLONASE ) 50 MCG/ACT nasal spray 2 sprays nightly.     hydrocortisone-pramoxine (ANALPRAM-HC) 2.5-1 % rectal cream Place 1 application  rectally daily as needed for hemorrhoids.     ipratropium (ATROVENT) 0.03 % nasal spray Place 1 spray into both nostrils 2 (two) times daily.     levothyroxine (SYNTHROID) 25 MCG tablet Take 25 mcg by mouth daily.     metoprolol  succinate (TOPROL -XL) 25 MG 24 hr tablet Take 1 tablet (25 mg total) by mouth daily. 90 tablet 3   montelukast  (SINGULAIR ) 10 MG tablet Take 1 tablet (10 mg total) by mouth at bedtime. 90 tablet 3   nystatin cream (MYCOSTATIN) Apply 1 application topically 2 (two) times daily as needed (yeast).     olmesartan  (BENICAR ) 20 MG tablet Take 1 tablet (20 mg total) by mouth daily. 90 tablet 3   olopatadine (PATANOL) 0.1 % ophthalmic solution Place 1 drop into both eyes 2 (two) times daily as needed for allergies.     PRESCRIPTION MEDICATION Place 1 application vaginally 2 (two) times a week. Estradiol ellage 0.02%, 0.2mg /72ml     Propylene Glycol (SYSTANE COMPLETE PF OP) Place 1-2 drops into both eyes 2 (two) times daily.     rosuvastatin  (CRESTOR ) 5 MG tablet Take 1 tablet (5  mg total) by mouth daily. 90 tablet 3   SPRAVATO, 84 MG DOSE, 28 MG/DEVICE SOPK      sucralfate  (CARAFATE ) 1 GM/10ML suspension Take 10 mLs (1 g total) by mouth every 6 (six) hours as needed. 1200 mL 3   terconazole (TERAZOL 3) 0.8 % vaginal cream INSERT 1 APPLICATORFUL VAGINALLY DAILY FOR 3 DAYS AT BEDTIME     triamcinolone cream (KENALOG) 0.1 % APPLY TOPICALLY TO THE AFFECTED AREA 3 TIMES EVERY WEEK     valACYclovir (VALTREX) 500 MG tablet Take 500 mg by mouth daily.     Vitamin D, Ergocalciferol, (DRISDOL) 1.25 MG (50000 UNIT) CAPS capsule Take 50,000 Units by mouth every Wednesday.     DAYVIGO 10 MG TABS Take 1 tablet by mouth at bedtime. (Patient not taking: Reported on 10/05/2024)     Current Facility-Administered Medications on File Prior to Visit  Medication Dose Route Frequency Provider Last Rate Last Admin   0.9 %  sodium chloride  infusion  500 mL Intravenous Once Cirigliano, Vito V, DO        Allergies:   Allergies  Allergen Reactions   Pseudoephedrine Hcl Palpitations    Tachycardia   Pseudoephedrine Hcl Palpitations    Tachycardia   Cortisone Other (See Comments)    Other Reaction(s): Unknown Patient up all night and had itchy skin   Epinephrine Other (See Comments)    Other Reaction(s): sensitivity and can make her heart rate go higher   Fentanyl  Other (See Comments)    Does not work well, itching pt prefers not to have  Other Reaction(s): Unknown   Levofloxacin Other (See Comments)    extreme muscle pain   Ephedrine Palpitations      OBJECTIVE:  Physical Exam  Vitals:   10/05/24 0803 10/05/24 0832  BP:  118/86  Pulse: 74   SpO2: 97%   Weight: 162 lb 6.4 oz (73.7 kg)   Height: 5' 1 (1.549 m)  Body mass index is 30.69 kg/m. No results found.   General: well developed, well nourished, very pleasant middle-age Caucasian female, seated, in no evident distress Head: head normocephalic and atraumatic.   Neck: supple with no carotid or  supraclavicular bruits Cardiovascular: regular rate and rhythm, no murmurs  Neurologic Exam Mental Status: Awake and fully alert. Oriented to place and time. Recent and remote memory intact. Attention span, concentration and fund of knowledge appropriate. Mood and affect appropriate.  Cranial Nerves: Pupils equal, briskly reactive to light. Extraocular movements full without nystagmus. Visual fields full to confrontation. Hearing intact. Facial sensation intact. Face, tongue, palate moves normally and symmetrically.  Motor: Normal bulk and tone. Normal strength in all tested extremity muscles Sensory.: intact to touch , pinprick , position and vibratory sensation.  Coordination: Rapid alternating movements normal in all extremities. Finger-to-nose and heel-to-shin performed accurately bilaterally. Gait and Station: Arises from chair without difficulty. Stance is normal. Gait demonstrates normal stride length and balance without use of AD. Tandem walk and heel toe without difficulty.  Reflexes: 1+ and symmetric. Toes downgoing.         ASSESSMENT: Kayla Sanders is a 63 y.o. year old female with long standing history of sleep apnea    PLAN:  OSA on CPAP :  Compliance report shows excellent usage although suboptimal residual AHI. Previously well controlled on set pressure of 18 but was changed to autoPAP with reduced settings by PCP (per DME company). She wishes to keep with autoPAP settings, will adjust settings from 5-15 to 8-18 with EPR 3. Hopefully tolerance will improve as well with change in settings.  Suspect residual fatigue multifactorial due to other comorbidities but also hopeful that she will see some benefit once apnea is being well treated Discussed importance of continued nightly usage with greater than 4 hours per night for optimal benefit Continue to follow with DME [facility] for any needed supplies or CPAP related concerns CPAP set up 2022     Follow-up in 6  months with Dr. Buck for further recommendations regarding continued difficulty tolerating CPAP and residual high level of fatigue. Advised to call earlier if needed     Orders Placed This Encounter  Procedures   For home use only DME continuous positive airway pressure (CPAP)    DME [facility]  Will adjust pressure settings to 8-18 (settings adjusted via resmed) Please do not make any pressure setting adjustments unless orders placed from this office Continue to provide supplies as indicated    Length of Need:   Lifetime    Patient has OSA or probable OSA:   Yes    Is the patient currently using CPAP in the home:   Yes    If yes (to question two):   Determine DME provider and inform them of any new orders/settings    Date of face to face encounter:   10/05/2024    Settings:   Autotitration    CPAP supplies needed:   Mask, headgear, cushions, filters, heated tubing and water chamber    Additional equipment included:   Heated humification and supplies      CC:  PCP: Burney Darice CROME, MD    I personally spent a total of 30 minutes in the care of the patient today including preparing to see the patient, getting/reviewing separately obtained history, performing a medically appropriate exam/evaluation, counseling and educating, placing orders, and documenting clinical information in the EHR.   Harlene Bogaert, AGNP-BC  [facility] [phone]  [address] [place], [place] [address]  Phone [phone] Fax [phone] Note: This document was prepared with digital dictation and possible smart phrase technology. Any transcriptional errors that result from this process are unintentional.

## 2024-10-04 NOTE — Telephone Encounter (Signed)
 Called & spoke with patient. EGD has been scheduled at [facility] on Tuesday, 11-21-24 at 11:10 am, arrival time 9:40 am . Patient confirmed access to MyChart and knows to expect instructions via MyChart today. Patient verbalized understanding and had no concerns at the end of the call.  Ambulatory referral to GI in epic.   EGD instructions sent via MyChart.

## 2024-10-05 ENCOUNTER — Encounter: Attending: Physical Medicine & Rehabilitation | Admitting: Physical Medicine & Rehabilitation

## 2024-10-05 ENCOUNTER — Encounter: Payer: Self-pay | Admitting: Adult Health

## 2024-10-05 ENCOUNTER — Ambulatory Visit (INDEPENDENT_AMBULATORY_CARE_PROVIDER_SITE_OTHER): Admitting: Adult Health

## 2024-10-05 ENCOUNTER — Encounter: Payer: Self-pay | Admitting: Physical Medicine & Rehabilitation

## 2024-10-05 VITALS — BP 118/86 | HR 74 | Ht 61.0 in | Wt 162.4 lb

## 2024-10-05 VITALS — BP 119/77 | HR 69 | Ht 61.0 in | Wt 164.0 lb

## 2024-10-05 DIAGNOSIS — M797 Fibromyalgia: Secondary | ICD-10-CM | POA: Diagnosis not present

## 2024-10-05 DIAGNOSIS — M359 Systemic involvement of connective tissue, unspecified: Secondary | ICD-10-CM | POA: Insufficient documentation

## 2024-10-05 DIAGNOSIS — R141 Gas pain: Secondary | ICD-10-CM | POA: Insufficient documentation

## 2024-10-05 DIAGNOSIS — R1013 Epigastric pain: Secondary | ICD-10-CM | POA: Insufficient documentation

## 2024-10-05 DIAGNOSIS — K625 Hemorrhage of anus and rectum: Secondary | ICD-10-CM | POA: Insufficient documentation

## 2024-10-05 DIAGNOSIS — R194 Change in bowel habit: Secondary | ICD-10-CM | POA: Insufficient documentation

## 2024-10-05 DIAGNOSIS — M357 Hypermobility syndrome: Secondary | ICD-10-CM | POA: Diagnosis present

## 2024-10-05 DIAGNOSIS — M1990 Unspecified osteoarthritis, unspecified site: Secondary | ICD-10-CM | POA: Insufficient documentation

## 2024-10-05 DIAGNOSIS — G4733 Obstructive sleep apnea (adult) (pediatric): Secondary | ICD-10-CM | POA: Diagnosis not present

## 2024-10-05 DIAGNOSIS — E039 Hypothyroidism, unspecified: Secondary | ICD-10-CM | POA: Insufficient documentation

## 2024-10-05 DIAGNOSIS — M1380 Other specified arthritis, unspecified site: Secondary | ICD-10-CM | POA: Insufficient documentation

## 2024-10-05 NOTE — Patient Instructions (Addendum)
 Your Plan:  Pressure setting adjusted today - will repeat CPAP download in 1 month for re evaluation   Continue to follow with DME for CPAP supplies or CPAP related concerns      Follow up in 6 months with Dr. Buck or call earlier if needed      Thank you for coming to see us  at [facility]. I hope we have been able to provide you high quality care today.  You may receive a patient satisfaction survey over the next few weeks. We would appreciate your feedback and comments so that we may continue to improve ourselves and the health of our patients.

## 2024-10-05 NOTE — Progress Notes (Signed)
 Subjective:    Patient ID: Kayla Sanders, female    DOB: Jul 18, 1961, 63 y.o.   MRN: [id number]  HPI   Kayla Sanders is a 63 y.o. year old female  who  has a past medical history of Anxiety, Anxiety, Arthritis, Asthma, Complication of anesthesia, Depression, Difficult airway for intubation, Family history of adverse reaction to anesthesia, Fibromyalgia, GERD (gastroesophageal reflux disease), Hyperlipidemia, Hypertension, Insomnia, Migraines, Mitral valve prolapse, OSA (obstructive sleep apnea), Oxygen deficiency, 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 (02/03/2021), PONV (postoperative nausea and vomiting), Sleep apnea, Thyroid  disease, and Vitamin D deficiency.   They are presenting to PM&R clinic as a new patient for pain management evaluation.  She is referred for evaluation for her fibromyalgia pain.  She says she was diagnosed many years ago in the 90s with fibromyalgia by rheumatology.  She also has suspected history of Ehlers-Danlos syndrome.  She has never been formally diagnosed but she has daughters with this condition.  She has chronic severe pain throughout her entire body.  She also has a history of shoulder pain bilaterally.  She says she has been seen by orthopedics and they are concerned of a right rotator cuff disorder.  She had an MRI completed of her right shoulder and is waiting on the results.  She also has been noted to have numbness in her digits 4 and 5 in her bilateral hands.  She has been started on nighttime extension bracing for suspected cubital tunnel syndrome.  She had prior right carpal tunnel release in the 90s, continues to have some wrist pain but numbness is much improved.  She continues to suffer with depression anxiety and PTSD.  No SI or HI.  She is followed by mental health.  She was previously tried on duloxetine which initially helped her pain significantly.  Analgesia wore off over time and she had side effects with this medication.   She had withdrawal that was very difficult trying to get off the duloxetine.  She is not currently on any antidepressant medications, mental health is considering s ketamin treatment.  She has recently been working with physical therapy and this has been helping to improve her pain.  Red flag symptoms: No red flags for back pain endorsed in Hx or ROS  Medications tried: Topical medications - has not tried Nsaids - Cannot take due to GI, stomach cultures Tylenol  - Helps at night Opiates   Fentanyl -surgery, did not tolerate Codeine, oxycodone, hydrocodone- caused a lot of side effects, she can take for very short time Gabapentin- Lyrica can't didn't tolerate but can't remember why Gabantin- concerned about brain fog TCAs  - has not tried  SNRIs  duloxetine- side effects, she reports withdrawal she she stopped this Xanax  at night for sleep Considering ketamine for pain   Other treatments: PT/OT - Currently working with PT, helping TENs unit - hasn't tried Injections -shoulder injections helped at first, stopped working Surgery - CTS release on right    Prior UDS results: No results found for: LABOPIA, COCAINSCRNUR, LABBENZ, AMPHETMU, THCU, LABBARB    07/09/23 interval history Kayla Sanders is here for follow-up regarding her chronic body wide pain.  She reports she has not heard from [facility] about nexwave. She is planning to try spravato treatment this September.  She had to discontinue her physical therapy due to her busy schedule at work.  She would consider trying aqua therapy at a later time when her work schedule improves.  Patient reports that  she had withdrawal when she stopped her Cymbalta previously and that is why her pain was worse last visit.  When she was taking Cymbalta that she was having nightmares and other side effects that prevented her from continuing it.  She would like to avoid additional medications until Spravato treatment is attempted.  Patient reports  she has seen a hand doctor and has MRI planned to evaluate for cubital tunnel.  10/08/23 Kayla Sanders is here for follow-up regarding chronic pain related to her fibromyalgia and hypermobility.  She has been using Spravato for less than 12 weeks.  She feels like this has provided a mild decrease to her pain.  It is also mildly improved her mood which she is doing ok.  Furthermore she feels like it has helped with her balance.  She is agreeable to trying physical therapy at [facility].  She would consider doing aquatic therapy in the future but would like to hold off at this time.  She continues to have bilateral shoulder pain, has been followed by orthopedics and had an MRI completed earlier this year of her right shoulder.  Interval history 01/14/2024 Patient reports she has been seen by rheumatology recently, had imaging studies to evaluate for osteoarthritis.  Patient says she was told she also might have psoriatic arthritis?   Chart review indicates rheumatology does not think she has rheumatoid arthritis or lupus.  X-rays were ordered to evaluate extent of osteoarthritis.  And biopsy of rash by dermatology recommended if it reoccurs to confirm if rash is autoimmune in nature.  She reports she continues to take s-ketamine now at increased dose.  This was helping her mood but recently has been worse.  She is considering other options such as TMS.  She is working with land physical therapy, would consider aquatic therapy at a later time but not now  She is not interested in any medication changes at this time  Interval history 10/05/24  Pain is about the same, with some days worse than others. Reports fibromyalgia, hypermobility, and osteoarthritis. Current main areas of pain are the neck, lower back, knees, and hands. Back pain is steady with intermittent radicular symptoms down the leg. Hand pain is an aching quality. Neck pain is significant. Hip pain is intermittent. Shoulder pain is  stable, with occasional popping with certain movements like reaching back.  Pt reports rheumatologist  previously diagnosed osteoarthritis in multiple joints based on x-rays. A subsequent orthopedic opinion suggested minimal osteoarthritis on their x-rays, but rather synovitis, and recommended oral steroids, which were declined on the advice of her family doctor. Knee injections provided relief in the past initially but were not effective subsequently.  Numbness in fingers is intermittent and not severe enough to consider surgery. Reports a prior nerve conduction study was not well tolerated.  - Reports ongoing esketamine treatments, currently once a month.  The treatment is now providing benefit after an initial period of inefficacy. - Has not yet tried the TENS unit but will attempt to use it for joint pain.   - Completed a course of aquatic physical therapy which was found to be helpful and allowed for more exercise with less joint pressure.  Plans to consider exercises in a family member's pool and is exploring membership at a [employer] or aquatic center to continue. - Plans to try virtual physical therapy available through her employer. Advised to inform the therapist about hypermobility to prevent over-stressing joints. - Considering Tai Chi at a local senior center. -  Provided a letter of medical necessity for a heated pool exercise program.  Pain Inventory Average Pain 3 Pain Right Now 3 My pain is constant, tingling, and aching, sharp  In the last 24 hours, has pain interfered with the following? General activity 3 Relation with others 3 Enjoyment of life 3 What TIME of day is your pain at its worst? night Sleep (in general) Poor  Pain is worse with: walking, bending, sitting, inactivity, statnding,  Pain improves with: therapy/exercise, heat/ice, medication Relief from Meds: 6         Family History  Problem Relation Age of Onset   Arthritis Mother     Hyperlipidemia Mother    Hypertension Mother    Sleep apnea Mother    Colon cancer Mother 41   Lung cancer Mother    Osteopenia Mother    Arthritis Father    Hyperlipidemia Father    Heart disease Father    Stroke Father    Hypertension Father    Sleep apnea Father    Osteopenia Father    Colon polyps Brother    Sleep apnea Brother    Colon polyps Brother    Atrial fibrillation Brother    Lung cancer Maternal Aunt    Osteoporosis Maternal Aunt    Severe combined immunodeficiency Maternal Aunt    Pancreatic cancer Maternal Uncle    Breast cancer Paternal Aunt    Arthritis Other    Breast cancer Other    Diabetes Other    Liver disease Neg Hx    Esophageal cancer Neg Hx    Stomach cancer Neg Hx    Rectal cancer Neg Hx    Social History   Socioeconomic History   Marital status: Married    Spouse name: Not on file   Number of children: 3   Years of education: BS   Highest education level: Not on file  Occupational History   Occupation: [employer]  Tobacco Use   Smoking status: Former    Current packs/day: 0.00    Types: Cigarettes    Quit date: 11/23/1981    Years since quitting: 42.8   Smokeless tobacco: Never  Vaping Use   Vaping status: Never Used  Substance and Sexual Activity   Alcohol use: Never    Comment: Rare, then zero for ages   Drug use: No   Sexual activity: Yes  Other Topics Concern   Not on file  Social History Narrative   Married with grown children. Works for [employer] as a nurse, children's. Nonsmoker, quit in 1983. No Etoh.   Consumes about 3+ caffeine drinks a day    Social Drivers of Corporate Investment Banker Strain: Not on file  Food Insecurity: Not on file  Transportation Needs: Not on file  Physical Activity: Not on file  Stress: Not on file  Social Connections: Unknown (04/05/2022)   [facility]   Social Network    Social Network: Not on file   Past Surgical History:  Procedure Laterality Date   24 HOUR PH  STUDY  12/10/2021   Procedure: 24 HOUR PH STUDY;  Surgeon: Shila Gustav GAILS, MD;  Location: [facility];  Service: Endoscopy;;   ABDOMINAL HYSTERECTOMY     partial   BIOPSY  06/12/2021   Procedure: BIOPSY;  Surgeon: Shila Gustav GAILS, MD;  Location: [facility];  Service: Endoscopy;;   BRAVO PH STUDY N/A 02/06/2013   Procedure: BRAVO PH STUDY;  Surgeon: Renaye Sous, MD;  Location: [facility];  Service: Endoscopy;  Laterality: N/A;   CARPAL TUNNEL RELEASE Right    CHOLECYSTECTOMY     COLONOSCOPY WITH PROPOFOL  N/A 10/09/2016   Procedure: COLONOSCOPY WITH PROPOFOL ;  Surgeon: Belvie Just, MD;  Location: [facility];  Service: Endoscopy;  Laterality: N/A;   COLONOSCOPY WITH PROPOFOL  N/A 06/12/2021   Procedure: COLONOSCOPY WITH PROPOFOL ;  Surgeon: Shila Gustav GAILS, MD;  Location: [facility];  Service: Endoscopy;  Laterality: N/A;   colonscopy  2012   ESOPHAGEAL MANOMETRY N/A 12/10/2021   Procedure: ESOPHAGEAL MANOMETRY (EM);  Surgeon: Shila Gustav GAILS, MD;  [facility];  Service: Endoscopy;  Laterality: N/A;   ESOPHAGOGASTRODUODENOSCOPY N/A 02/06/2013   Procedure: ESOPHAGOGASTRODUODENOSCOPY (EGD);  Surgeon: Renaye Sous, MD;  [facility];  Service: Endoscopy;  Laterality: N/A;   ESOPHAGOGASTRODUODENOSCOPY (EGD) WITH PROPOFOL  N/A 10/09/2016   Procedure: ESOPHAGOGASTRODUODENOSCOPY (EGD) WITH PROPOFOL ;  Surgeon: Belvie Just, MD;  [facility];  Service: Endoscopy;  Laterality: N/A;   ESOPHAGOGASTRODUODENOSCOPY (EGD) WITH PROPOFOL  N/A 06/12/2021   Procedure: ESOPHAGOGASTRODUODENOSCOPY (EGD) WITH PROPOFOL ;  Surgeon: Shila Gustav GAILS, MD;  [facility];  Service: Endoscopy;  Laterality: N/A;   ESOPHAGOGASTRODUODENOSCOPY (EGD) WITH PROPOFOL  N/A 12/25/2021   Procedure: ESOPHAGOGASTRODUODENOSCOPY (EGD) WITH PROPOFOL ;  Surgeon: Shila Gustav GAILS, MD;  [facility];  Service: Endoscopy;  Laterality: N/A;   HEMORRHOIDECTOMY WITH HEMORRHOID  BANDING     HOT HEMOSTASIS N/A 06/12/2021   Procedure: HOT HEMOSTASIS (ARGON PLASMA COAGULATION/BICAP);  Surgeon: Nandigam, Kavitha V, MD;  Location: [facility];  Service: Endoscopy;  Laterality: N/A;   NASAL SEPTUM SURGERY     PH IMPEDANCE STUDY N/A 12/10/2021   Procedure: PH IMPEDANCE STUDY;  Surgeon: Shila Gustav GAILS, MD;  Location: [facility];  Service: Endoscopy;  Laterality: N/A;   POLYPECTOMY  06/12/2021   Procedure: POLYPECTOMY;  Surgeon: Shila Gustav GAILS, MD;  Location: [facility];  Service: Endoscopy;;   RECTOCELE REPAIR     TMJ ARTHROPLASTY     and removal   TUBAL LIGATION     VESICOVAGINAL FISTULA CLOSURE W/ TAH     Past Medical History:  Diagnosis Date   Anxiety    Anxiety    Arthritis    Asthma    Complication of anesthesia    told by anesthesia had small airway   Depression    Difficult airway for intubation    Patient states she is a difficult airway and was told she needs to wear a bracelet to alert everyone to airway problems in the past by MDA.  MDA and case was in the 90s.   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 02/03/2021   PONV (postoperative nausea and vomiting)    with general anesthesia severe ponv, does ok with propofol    Sleep apnea    Thyroid  disease    Vitamin D deficiency    BP 119/77 (BP Location: Left Arm, Patient Position: Sitting, Cuff Size: Large)   Pulse 69   Ht 5' 1 (1.549 m)   Wt 164 lb (74.4 kg)   SpO2 100%   BMI 30.99 kg/m  Original BP 142/83--rechecked Opioid Risk Score:  Fall Risk Score:  `1  Depression screen [facility] 2/9     02/11/2024    3:10 PM 10/08/2023    3:17 PM 07/09/2023    3:07 PM 03/23/2023    10:13 AM 09/05/2013   10:40 AM  Depression screen PHQ 2/9  Decreased Interest 2 1 2 2  0  Down, Depressed, Hopeless 2 1 2 2  0  PHQ - 2 Score 4 2 4 4  0  Altered sleeping 3   3   Tired, decreased energy 3   3   Change in appetite 0   2   Feeling bad or failure about yourself  3   2   Trouble concentrating 1   1   Moving slowly or fidgety/restless 0   0   Suicidal thoughts 1   0   PHQ-9 Score 15    15    Difficult doing work/chores Very difficult   Very difficult      Data saved with a previous flowsheet row definition     Review of Systems  Constitutional:  Positive for unexpected weight change.       Wt gain  HENT: Negative.    Eyes: Negative.   Respiratory: Negative.    Cardiovascular: Negative.   Gastrointestinal: Negative.   Endocrine: Negative.   Genitourinary:  Positive for urgency.  Musculoskeletal:  Positive for gait problem and myalgias.       Pain in all major joints  Skin: Negative.   Allergic/Immunologic: Negative.   Neurological:  Positive for weakness and numbness.  Hematological: Negative.   Psychiatric/Behavioral:  Positive for dysphoric mood. The patient is nervous/anxious.   All other systems reviewed and are negative.      Objective:   Physical Exam Gen: no distress, normal appearing HEENT: oral mucosa pink and moist, NCAT Cardio: Reg rate Chest: normal effort, normal rate of breathing Abd: soft, non-distended Ext: no edema Psych: pleasant, normal affect Skin: intact Neuro: alert and awake Follows commands, CN 2-12 grossly intact, normal speech and language MSK: On examination, tender to palpation over the hands, wrists, shoulders, neck, lower back, hips, knees, and feet, consistent with fibromyalgia.  Prior exam Sensation intact but altered fourth and fifth digits bilateral hands Slump test negative No focal motor deficits noted     Xray C spine 02/17/21 EXAMINATION: MRI cervical spine   CLINICAL INDICATION:Neck pain, headaches    TECHNIQUE: MRI cervical spine without contrast.   COMPARISON:None   FINDINGS:   The bone marrow signal is normal.   The craniocervical junction is normal.   The cervical spinal cord is normal.   C2-C3: Normal   C3-C4: The disc is normal. There is mild facet arthropathy on the right.   C4-C5: The disc is normal. There is facet arthropathy on the left.   C5-C6: There is a mild disc bulge. Facet joints are normal.   C6-C7: There is a mild disc bulge. Facet joints are normal   C7-T1: Normal     Assessment & Plan:   Fibromyalgia  -Intolerance to multiple prior medications -Amitriptyline could be an option to try at a later time, savella could also be an option however she is hesitant to try this due to withdrawal with duloxetine -May consider low dose naltrexone if stops esketamine -She declines any medication changes today -Poor tolerance of cymbalta in the past, it did help pain initially  -Prior visit ordered  [facility] Nexwave -advised to try -List of foods for pain discussed  -Benefit with aquatic therapy- continue  exercise in pool on her own -She is currently taking esketamine -Discussed gradual progressive low impact exercise such as walking -Option for shockwave therapy/injections could be considered if one area becomes primary source of pain -Discussed trying tai chi -Consider Journavx for pain exacerbations- discussed with patient   Osteoarthritis -Rheumatologist note indicates OA in multiple joints, Ortho note indicates minimal OA at knees/ DDD L5-S1  Hypermobility syndrome, appears more mild  -Joint protection techniques  B/L shoulder pain -OA vs Rotator cuff impingement  -MRI right shoulder completed by orthopedics, Section of Ortho note 04/09/23  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 Mild AC arthrosis otherwise unremarkable   B/L 4th and 5th digit numbness -Tinels positive elbow, she has not been wearing elbow splints regularly,  they keep falling off -Patient does not think she could tolerate EMG nerve conduction study.   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

## 2024-10-09 ENCOUNTER — Ambulatory Visit: Admitting: Adult Health

## 2024-10-25 ENCOUNTER — Telehealth: Payer: Self-pay

## 2024-10-25 ENCOUNTER — Other Ambulatory Visit: Payer: Self-pay

## 2024-10-25 ENCOUNTER — Encounter: Payer: Self-pay | Admitting: Gastroenterology

## 2024-10-25 MED ORDER — DEXLANSOPRAZOLE 60 MG PO CPDR: 60.0000 mg | DELAYED_RELEASE_CAPSULE | Freq: Every day | ORAL | 3 refills | Status: AC

## 2024-10-25 NOTE — Telephone Encounter (Signed)
 Please start prior authorization process for Dexilant .

## 2024-10-26 ENCOUNTER — Other Ambulatory Visit (HOSPITAL_COMMUNITY): Payer: Self-pay

## 2024-10-26 ENCOUNTER — Telehealth: Payer: Self-pay

## 2024-10-26 NOTE — Telephone Encounter (Signed)
 PA request has been Submitted. New Encounter has been or will be created for follow up. For additional info see Pharmacy Prior Auth telephone encounter from 10-26-2024.

## 2024-10-26 NOTE — Telephone Encounter (Signed)
 Pharmacy Patient Advocate Encounter   Received notification from CoverMyMeds that prior authorization for Dexlansoprazole  60MG  dr capsules is required/requested.   Insurance verification completed.   The patient is insured through [facility].   Per test claim: PA required; PA submitted to above mentioned insurance via Latent Key/confirmation #/EOC [id number] Status is pending

## 2024-10-30 ENCOUNTER — Other Ambulatory Visit (HOSPITAL_COMMUNITY): Payer: Self-pay

## 2024-10-30 NOTE — Telephone Encounter (Signed)
 Pharmacy Patient Advocate Encounter  Received notification from [employer] that Prior Authorization for Dexlansoprazole  60MG  dr capsules has been APPROVED from 10-26-2024 to 10-26-2025. Ran test claim, Copay is $0.00**. This test claim was processed through [facility]- copay amounts may vary at other pharmacies due to pharmacy/plan contracts, or as the patient moves through the different stages of their insurance plan.  **Insurance pays 30 days MAXIMUM  PA #/Case ID/Reference #: [id number]

## 2024-11-13 ENCOUNTER — Encounter (HOSPITAL_COMMUNITY): Payer: Self-pay | Admitting: Gastroenterology

## 2024-11-13 ENCOUNTER — Other Ambulatory Visit: Payer: Self-pay

## 2024-11-13 ENCOUNTER — Telehealth: Payer: Self-pay

## 2024-11-13 NOTE — Progress Notes (Addendum)
 Anesthesia Review:  PCP: Darice Henle  Cardiologist : Vina Gull  Tessa conte LOV 11/01/23  Neuro- McCue LVO 10/05/24   PPM/ ICD: Device Orders: Rep Notified:  Chest x-ray : EKG : 11/01/23  CT cors- 12/03/23  Echo : 2013  Stress test: Cardiac Cath :   Activity level: can do a flight of stairs withouit difficuty  Sleep Study/ CPAP :  has cpap  Fasting Blood Sugar :      / Checks Blood Sugar -- times a day:    Blood Thinner/ Instructions /Last Dose: ASA / Instructions/ Last Dose :    LVMM on 11/13/24.  Asked for call back,.  Pt returned call on 11/13/24.  Med hx and preprocedure instructionsi completed.     Diagnosed iwht MVP but then pt states they determined she did not have.

## 2024-11-13 NOTE — Telephone Encounter (Signed)
 Procedure:EGD Procedure date: 11/21/24 Procedure location: [facility] Arrival Time: 9:39 Spoke with the patient Y/N: Y Any prep concerns? N Has the patient obtained the prep from the pharmacy ? N Do you have a care partner and transportation: Y Any additional concerns? N

## 2024-11-20 NOTE — Anesthesia Preprocedure Evaluation (Signed)
"                                    Anesthesia Evaluation  Patient identified by MRN, date of birth, ID band Patient awake    Reviewed: Allergy & Precautions, NPO status , Patient's Chart, lab work & pertinent test results, reviewed documented beta blocker date and time   History of Anesthesia Complications (+) PONV, DIFFICULT AIRWAY, Family history of anesthesia reaction and history of anesthetic complications  Airway Mallampati: II  TM Distance: >3 FB Neck ROM: Full    Dental no notable dental hx. (+) Teeth Intact, Dental Advisory Given   Pulmonary asthma , sleep apnea , former smoker   Pulmonary exam normal breath sounds clear to auscultation       Cardiovascular hypertension, Pt. on medications and Pt. on home beta blockers Normal cardiovascular exam+ Valvular Problems/Murmurs MVP  Rhythm:Regular Rate:Normal     Neuro/Psych  Headaches PSYCHIATRIC DISORDERS Anxiety Depression       GI/Hepatic ,GERD  ,,  Endo/Other  Hypothyroidism    Renal/GU      Musculoskeletal  (+) Arthritis ,  Fibromyalgia -  Abdominal   Peds  Hematology   Anesthesia Other Findings All : levofloxacin  Reproductive/Obstetrics                              Anesthesia Physical Anesthesia Plan  ASA: 3  Anesthesia Plan: MAC   Post-op Pain Management: Minimal or no pain anticipated   Induction:   PONV Risk Score and Plan: Treatment may vary due to age or medical condition and Propofol  infusion  Airway Management Planned: Natural Airway and Nasal Cannula  Additional Equipment: None  Intra-op Plan:   Post-operative Plan:   Informed Consent: I have reviewed the patients History and Physical, chart, labs and discussed the procedure including the risks, benefits and alternatives for the proposed anesthesia with the patient or authorized representative who has indicated his/her understanding and acceptance.     Dental advisory given  Plan  Discussed with:   Anesthesia Plan Comments: (EGD for GERD)         Anesthesia Quick Evaluation  "

## 2024-11-21 ENCOUNTER — Ambulatory Visit (HOSPITAL_COMMUNITY): Admitting: Anesthesiology

## 2024-11-21 ENCOUNTER — Other Ambulatory Visit: Payer: Self-pay

## 2024-11-21 ENCOUNTER — Encounter (HOSPITAL_COMMUNITY)
Admission: RE | Disposition: A | Discharge status: Home / Self Care | Payer: Self-pay | Source: Home / Self Care | Attending: Gastroenterology

## 2024-11-21 ENCOUNTER — Encounter (HOSPITAL_COMMUNITY): Payer: Self-pay | Admitting: Gastroenterology

## 2024-11-21 ENCOUNTER — Ambulatory Visit (HOSPITAL_COMMUNITY)
Admission: RE | Admit: 2024-11-21 | Discharge: 2024-11-21 | Disposition: A | Discharge status: Home / Self Care | Attending: Gastroenterology | Admitting: Gastroenterology

## 2024-11-21 ENCOUNTER — Encounter (HOSPITAL_COMMUNITY): Admitting: Anesthesiology

## 2024-11-21 DIAGNOSIS — F32A Depression, unspecified: Secondary | ICD-10-CM | POA: Diagnosis not present

## 2024-11-21 DIAGNOSIS — K317 Polyp of stomach and duodenum: Secondary | ICD-10-CM | POA: Diagnosis not present

## 2024-11-21 DIAGNOSIS — G4733 Obstructive sleep apnea (adult) (pediatric): Secondary | ICD-10-CM | POA: Diagnosis not present

## 2024-11-21 DIAGNOSIS — M1990 Unspecified osteoarthritis, unspecified site: Secondary | ICD-10-CM | POA: Diagnosis not present

## 2024-11-21 DIAGNOSIS — J45909 Unspecified asthma, uncomplicated: Secondary | ICD-10-CM

## 2024-11-21 DIAGNOSIS — F419 Anxiety disorder, unspecified: Secondary | ICD-10-CM | POA: Diagnosis not present

## 2024-11-21 DIAGNOSIS — K219 Gastro-esophageal reflux disease without esophagitis: Secondary | ICD-10-CM | POA: Insufficient documentation

## 2024-11-21 DIAGNOSIS — F418 Other specified anxiety disorders: Secondary | ICD-10-CM

## 2024-11-21 DIAGNOSIS — Z79890 Hormone replacement therapy (postmenopausal): Secondary | ICD-10-CM | POA: Diagnosis not present

## 2024-11-21 DIAGNOSIS — G43909 Migraine, unspecified, not intractable, without status migrainosus: Secondary | ICD-10-CM | POA: Diagnosis not present

## 2024-11-21 DIAGNOSIS — G4700 Insomnia, unspecified: Secondary | ICD-10-CM | POA: Diagnosis not present

## 2024-11-21 DIAGNOSIS — Z79624 Long term (current) use of inhibitors of nucleotide synthesis: Secondary | ICD-10-CM | POA: Diagnosis not present

## 2024-11-21 DIAGNOSIS — I10 Essential (primary) hypertension: Secondary | ICD-10-CM | POA: Insufficient documentation

## 2024-11-21 DIAGNOSIS — R1013 Epigastric pain: Secondary | ICD-10-CM | POA: Insufficient documentation

## 2024-11-21 DIAGNOSIS — K449 Diaphragmatic hernia without obstruction or gangrene: Secondary | ICD-10-CM | POA: Insufficient documentation

## 2024-11-21 DIAGNOSIS — M797 Fibromyalgia: Secondary | ICD-10-CM | POA: Diagnosis not present

## 2024-11-21 DIAGNOSIS — R140 Abdominal distension (gaseous): Secondary | ICD-10-CM | POA: Insufficient documentation

## 2024-11-21 DIAGNOSIS — E785 Hyperlipidemia, unspecified: Secondary | ICD-10-CM | POA: Insufficient documentation

## 2024-11-21 DIAGNOSIS — E039 Hypothyroidism, unspecified: Secondary | ICD-10-CM | POA: Diagnosis not present

## 2024-11-21 DIAGNOSIS — Z79899 Other long term (current) drug therapy: Secondary | ICD-10-CM | POA: Diagnosis not present

## 2024-11-21 DIAGNOSIS — Z791 Long term (current) use of non-steroidal anti-inflammatories (NSAID): Secondary | ICD-10-CM | POA: Insufficient documentation

## 2024-11-21 DIAGNOSIS — I341 Nonrheumatic mitral (valve) prolapse: Secondary | ICD-10-CM | POA: Diagnosis not present

## 2024-11-21 DIAGNOSIS — D131 Benign neoplasm of stomach: Secondary | ICD-10-CM

## 2024-11-21 DIAGNOSIS — K2100 Gastro-esophageal reflux disease with esophagitis, without bleeding: Secondary | ICD-10-CM

## 2024-11-21 DIAGNOSIS — Z87891 Personal history of nicotine dependence: Secondary | ICD-10-CM | POA: Insufficient documentation

## 2024-11-21 HISTORY — PX: BIOPSY OF SKIN SUBCUTANEOUS TISSUE AND/OR MUCOUS MEMBRANE: SHX6741

## 2024-11-21 HISTORY — PX: ESOPHAGOGASTRODUODENOSCOPY: SHX5428

## 2024-11-21 HISTORY — DX: Hypothyroidism, unspecified: E03.9

## 2024-11-21 SURGERY — EGD (ESOPHAGOGASTRODUODENOSCOPY)
Anesthesia: Monitor Anesthesia Care

## 2024-11-21 MED ORDER — SODIUM CHLORIDE 0.9 % IV SOLN: INTRAVENOUS | Status: DC

## 2024-11-21 MED ORDER — PROPOFOL 500 MG/50ML IV EMUL
INTRAVENOUS | Status: DC | PRN
  Administered 2024-11-21: 250 mg via INTRAVENOUS

## 2024-11-21 MED ORDER — LIDOCAINE 2% (20 MG/ML) 5 ML SYRINGE
INTRAMUSCULAR | Status: DC | PRN
  Administered 2024-11-21: 50 mg via INTRAVENOUS

## 2024-11-21 MED ORDER — SODIUM CHLORIDE 0.9 % IV SOLN: INTRAVENOUS | Status: DC | PRN

## 2024-11-21 MED ORDER — PROPOFOL 1000 MG/100ML IV EMUL
INTRAVENOUS | Status: AC
  Filled 2024-11-21: qty 100

## 2024-11-21 NOTE — H&P (Signed)
 "    GASTROENTEROLOGY PROCEDURE H&P NOTE   Primary Care Physician: Burney Darice CROME, MD    Reason for Procedure:  GERD, heartburn, dyspepsia, preoperative evaluation  Plan:    EGD  Procedure scheduled at [facility] Endoscopy unit due to history of difficult airway.  Patient is appropriate for endoscopic procedure(s) at [facility] Endoscopy Unit.  The nature of the procedure, as well as the risks, benefits, and alternatives were carefully and thoroughly reviewed with the patient. Ample time for discussion and questions allowed. The patient understood, was satisfied, and agreed to proceed. I personally addressed all patient questions and concerns.     HPI: Kayla Sanders is a 63 y.o. female  with a history of anxiety, asthma, fibromyalgia, HLD, HTN, insomnia, migraines, TMJ dysfunction, mitral valve prolapse, OSA, postop nausea, cholecystectomy, abdominal hysterectomy with vesicovaginal fistula closure, rectocele repair, TMJ arthroplasty, hemorrhoidectomy with banding, nasal septum surgery, GERD, presenting today for EGD for evaluation of persistent heartburn and dyspepsia despite Dexilant , Pepcid , and Carafate  on demand, along with preoperative evaluation for possible concomitant hiatal hernia repair and TIF.  Additionally, EGD to evaluate for PUD or erosive esophagitis as she plans to initiate ASA 81 mg for cardiac prophylaxis.  Long standing hx of GERD since teenage years, with multiple meds over the years as below. Started Dexilant  about 10 years ago.  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   Currently taking Dexilant  every morning, Pepcid  in the evening, and Carafate  prn.    Evaluated by Dr. Cameron at [facility] in 12/2021 for consideration of HHR and antireflux surgery.  Recommended either robotic hiatal hernia repair with 270 degree fundoplication vs concomitant hiatal hernia repair and TIF.   GERD history: -Index symptoms: Heartburn,  regurgitation, throat clearing, NCCP -Exacerbating features: Tomato-based sauces, acidic foods, wine -Medications trialed: Prilosec, Prevacid, Protonix, Zegerid, Capadex, Nexium, Dexilant , Pepcid , Carafate  -Current medications: Dexilant  60 mg/day, Pepcid  40 mg  at bedtime, Carafate  prn every 6H -Complications: hiatal hernia, erosive esophagitis, overlapping functional heartburn   GERD evaluation: - Last EGD: 12/2021 - UGI series: 09/2021: Small HH, moderate gastroesophageal reflux, no stricture.  Normal motility -Esophageal Manometry: 11/2021: Normal -pH/Impedance: 11/2021 (on PPI): DeMeester score 0.5, pH <4 in 0% of time, very few reflux episodes.  No symptom correlation based on SAP c/w appropriate acid suppression therapy on medication -Bravo: Failed study 2014   Endoscopic History: - EGD (01/2013): Widely patent GEJ.  Gastric polyps.  Normal Z-line.  Bravo placed but immediately dislodged - EGD (09/2016): Normal esophagus.  Empiric dilation with 16 mm Savary.  Normal stomach and duodenum. - EGD (05/2021): LA Grade C esophagitis with bleeding esophageal ulcers, gastric erosions, duodenal ulcers - EGD (12/2021): Normal Z-line, normal esophagus, Hill grade 2 valve.  Normal stomach and duodenum     - Colonoscopy (05/2021): Small cecal AVM treated with APC, 1 mm appendiceal orifice polyp, 4 mm transverse colon adenoma, internal/external hemorrhoids.  Recommended repeat in 5 years  Past Medical History:  Diagnosis Date   Anxiety    Anxiety    Arthritis    Asthma    Complication of anesthesia    told by anesthesia had small airway   Depression    Difficult airway for intubation    Patient states she is a difficult airway and was told she needs to wear a bracelet to alert everyone to airway problems in the past by MDA.  MDA and case was in the 90s.   Family history of adverse reaction to anesthesia  daughter has severe ponv   Fibromyalgia    GERD (gastroesophageal reflux disease)     Hyperlipidemia    Hypertension    Hypothyroidism    Insomnia    Migraines    Mitral valve prolapse    OSA (obstructive sleep apnea)    on CPAP   Oxygen deficiency    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 02/03/2021   PONV (postoperative nausea and vomiting)    with general anesthesia severe ponv, does ok with propofol    Sleep apnea    Thyroid  disease    Vitamin D deficiency     Past Surgical History:  Procedure Laterality Date   6 HOUR PH STUDY  12/10/2021   Procedure: 24 HOUR PH STUDY;  Surgeon: Shila Gustav GAILS, MD;  Location: [facility];  Service: Endoscopy;;   ABDOMINAL HYSTERECTOMY     partial   BIOPSY  06/12/2021   Procedure: BIOPSY;  Surgeon: Shila Gustav GAILS, MD;  Location: [facility];  Service: Endoscopy;;   BRAVO PH STUDY N/A 02/06/2013   Procedure: BRAVO PH STUDY;  Surgeon: Renaye Sous, MD;  Location: [facility];  Service: Endoscopy;  Laterality: N/A;   CARPAL TUNNEL RELEASE Right    CESAREAN SECTION     CHOLECYSTECTOMY     COLONOSCOPY WITH PROPOFOL  N/A 10/09/2016   Procedure: COLONOSCOPY WITH PROPOFOL ;  Surgeon: Belvie Just, MD;  Location: [facility];  Service: Endoscopy;  Laterality: N/A;   COLONOSCOPY WITH PROPOFOL  N/A 06/12/2021   Procedure: COLONOSCOPY WITH PROPOFOL ;  Surgeon: Shila Gustav GAILS, MD;  Location: [facility];  Service: Endoscopy;  Laterality: N/A;   colonscopy  2012   ESOPHAGEAL MANOMETRY N/A 12/10/2021   Procedure: ESOPHAGEAL MANOMETRY (EM);  Surgeon: Shila Gustav GAILS, MD;  Location: [facility];  Service: Endoscopy;  Laterality: N/A;   ESOPHAGOGASTRODUODENOSCOPY N/A 02/06/2013   Procedure: ESOPHAGOGASTRODUODENOSCOPY (EGD);  Surgeon: Renaye Sous, MD;  Location: [facility];  Service: Endoscopy;  Laterality: N/A;   ESOPHAGOGASTRODUODENOSCOPY (EGD) WITH PROPOFOL  N/A 10/09/2016   Procedure: ESOPHAGOGASTRODUODENOSCOPY (EGD) WITH PROPOFOL ;  Surgeon: Belvie Just, MD;  Location: [facility];  Service:  Endoscopy;  Laterality: N/A;   ESOPHAGOGASTRODUODENOSCOPY (EGD) WITH PROPOFOL  N/A 06/12/2021   Procedure: ESOPHAGOGASTRODUODENOSCOPY (EGD) WITH PROPOFOL ;  Surgeon: Shila Gustav GAILS, MD;  Location: [facility];  Service: Endoscopy;  Laterality: N/A;   ESOPHAGOGASTRODUODENOSCOPY (EGD) WITH PROPOFOL  N/A 12/25/2021   Procedure: ESOPHAGOGASTRODUODENOSCOPY (EGD) WITH PROPOFOL ;  Surgeon: Shila Gustav GAILS, MD;  Location: [facility];  Service: Endoscopy;  Laterality: N/A;   HEMORRHOIDECTOMY WITH HEMORRHOID BANDING     HOT HEMOSTASIS N/A 06/12/2021   Procedure: HOT HEMOSTASIS (ARGON PLASMA COAGULATION/BICAP);  Surgeon: Nandigam, Kavitha V, MD;  Location: [facility];  Service: Endoscopy;  Laterality: N/A;   NASAL SEPTUM SURGERY     PH IMPEDANCE STUDY N/A 12/10/2021   Procedure: PH IMPEDANCE STUDY;  Surgeon: Shila Gustav GAILS, MD;  Location: [facility];  Service: Endoscopy;  Laterality: N/A;   POLYPECTOMY  06/12/2021   Procedure: POLYPECTOMY;  Surgeon: Shila Gustav GAILS, MD;  Location: [facility];  Service: Endoscopy;;   RECTOCELE REPAIR     TMJ ARTHROPLASTY     and removal   TUBAL LIGATION     VESICOVAGINAL FISTULA CLOSURE W/ TAH      Prior to Admission medications  Medication Sig Start Date End Date Taking? Authorizing Provider  acetaminophen (TYLENOL) 500 MG tablet Take 500 mg by mouth at bedtime.   Yes [provider]  ALPRAZolam  (XANAX ) 1 MG tablet Take 1 mg by  mouth at bedtime.   Yes [provider]  buPROPion (WELLBUTRIN XL) 150 MG 24 hr tablet Take 1 tab daily (in addition to a single 300mg ) 10/04/23  Yes [provider]  carboxymethylcellulose (REFRESH PLUS) 0.5 % SOLN Place 1 drop into both eyes 3 (three) times daily as needed (dry eyes).   Yes [provider]  dexlansoprazole  (DEXILANT ) 60 MG capsule Take 1 capsule (60 mg total) by mouth daily. 10/25/24  Yes Nesta Kimple V, DO  famotidine  (PEPCID ) 40 MG tablet Take 1 tablet (40 mg  total) by mouth at bedtime. 08/16/24  Yes Janelle Culton V, DO  fexofenadine (ALLEGRA) 180 MG tablet Take 180 mg by mouth every evening.   Yes [provider]  fluconazole (DIFLUCAN) 100 MG tablet Take 100 mg by mouth daily as needed (yeast). For yeast at caesarean scar. 09/24/16  Yes [provider]  hydrocortisone-pramoxine [facility]) 2.5-1 % rectal cream Place 1 application  rectally daily as needed for hemorrhoids. 07/06/16  Yes [provider]  levothyroxine (SYNTHROID) 25 MCG tablet Take 25 mcg by mouth daily.   Yes [provider]  metoprolol  succinate (TOPROL -XL) 25 MG 24 hr tablet Take 1 tablet (25 mg total) by mouth daily. 11/01/23  Yes Conte, Tessa N, PA-C  montelukast  (SINGULAIR ) 10 MG tablet Take 1 tablet (10 mg total) by mouth at bedtime. 09/05/13  Yes Inocencio Berwyn LABOR, MD  nystatin cream (MYCOSTATIN) Apply 1 application topically 2 (two) times daily as needed (yeast). 05/02/21  Yes [provider]  olmesartan  (BENICAR ) 20 MG tablet Take 1 tablet (20 mg total) by mouth daily. 11/01/23  Yes Conte, Tessa N, PA-C  olopatadine (PATANOL) 0.1 % ophthalmic solution Place 1 drop into both eyes 2 (two) times daily as needed for allergies.   Yes [provider]  PRESCRIPTION MEDICATION Place 1 application vaginally 2 (two) times a week. Estradiol ellage 0.02%, 0.2mg /41ml   Yes [provider]  Propylene Glycol (SYSTANE COMPLETE PF OP) Place 1-2 drops into both eyes 2 (two) times daily.   Yes [provider]  rosuvastatin  (CRESTOR ) 5 MG tablet Take 1 tablet (5 mg total) by mouth daily. 11/01/23  Yes Conte, Tessa N, PA-C  SPRAVATO, 84 MG DOSE, 28 MG/DEVICE SOPK  07/29/23  Yes [provider]  terconazole (TERAZOL 3) 0.8 % vaginal cream INSERT 1 APPLICATORFUL VAGINALLY DAILY FOR 3 DAYS AT BEDTIME   Yes [provider]  valACYclovir (VALTREX) 500 MG tablet Take 500 mg by mouth daily.   Yes [provider]   Vitamin D, Ergocalciferol, (DRISDOL) 1.25 MG (50000 UNIT) CAPS capsule Take 50,000 Units by mouth every Wednesday. 09/14/20  Yes [provider]  albuterol (VENTOLIN HFA) 108 (90 Base) MCG/ACT inhaler Inhale 2 puffs into the lungs every 4 (four) hours as needed for shortness of breath. 05/20/21   [provider]  Azelastine HCl 137 MCG/SPRAY SOLN Place 1 spray into both nostrils daily as needed for allergies. 10/09/21   [provider]  DAYVIGO 10 MG TABS Take 1 tablet by mouth at bedtime. 04/26/24   [provider]  diclofenac  Sodium (VOLTAREN  ARTHRITIS PAIN) 1 % GEL Apply 2 g topically 4 (four) times daily. 07/09/23   Urbano Albright, MD  fluticasone  (FLONASE ) 50 MCG/ACT nasal spray 2 sprays nightly. 06/25/21   [provider]  ipratropium (ATROVENT) 0.03 % nasal spray Place 1 spray into both nostrils 2 (two) times daily. 07/11/24   [provider]  sucralfate  (CARAFATE ) 1 GM/10ML suspension  Take 10 mLs (1 g total) by mouth every 6 (six) hours as needed. 09/08/24 10/08/24  Delbert Darley V, DO  triamcinolone cream (KENALOG) 0.1 % APPLY TOPICALLY TO THE AFFECTED AREA 3 TIMES EVERY WEEK    [provider]    Current Facility-Administered Medications  Medication Dose Route Frequency Provider Last Rate Last Admin   0.9 %  sodium chloride  infusion   Intravenous Continuous Zaine Elsass V, DO 20 mL/hr at 11/21/24 0714 New Bag at 11/21/24 0714    Allergies as of 10/04/2024 - Review Complete 10/02/2024  Allergen Reaction Noted   Pseudoephedrine hcl Palpitations 11/11/2015   Pseudoephedrine hcl Palpitations 11/11/2015   Cortisone Other (See Comments) 10/08/2023   Epinephrine Other (See Comments) 10/08/2023   Fentanyl  Other (See Comments) 10/01/2016   Levofloxacin Other (See Comments) 08/07/2021   Ephedrine Palpitations 11/02/2012    Family History  Problem Relation Age of Onset   Arthritis Mother    Hyperlipidemia Mother     Hypertension Mother    Sleep apnea Mother    Colon cancer Mother 68   Lung cancer Mother    Osteopenia Mother    Arthritis Father    Hyperlipidemia Father    Heart disease Father    Stroke Father    Hypertension Father    Sleep apnea Father    Osteopenia Father    Colon polyps Brother    Sleep apnea Brother    Colon polyps Brother    Atrial fibrillation Brother    Lung cancer Maternal Aunt    Osteoporosis Maternal Aunt    Severe combined immunodeficiency Maternal Aunt    Pancreatic cancer Maternal Uncle    Breast cancer Paternal Aunt    Arthritis Other    Breast cancer Other    Diabetes Other    Liver disease Neg Hx    Esophageal cancer Neg Hx    Stomach cancer Neg Hx    Rectal cancer Neg Hx     Social History   Socioeconomic History   Marital status: Married    Spouse name: Not on file   Number of children: 3   Years of education: BS   Highest education level: Not on file  Occupational History   Occupation: [employer]  Tobacco Use   Smoking status: Former    Current packs/day: 0.00    Types: Cigarettes    Quit date: 11/23/1981    Years since quitting: 43.0   Smokeless tobacco: Never  Vaping Use   Vaping status: Never Used  Substance and Sexual Activity   Alcohol use: Never    Comment: Rare, then zero for ages   Drug use: No   Sexual activity: Yes  Other Topics Concern   Not on file  Social History Narrative   Married with grown children. Works for [employer] as a nurse, children's. Nonsmoker, quit in 1983. No Etoh.   Consumes about 3+ caffeine drinks a day    Social Drivers of Health   Tobacco Use: Medium Risk (11/21/2024)   Patient History    Smoking Tobacco Use: Former    Smokeless Tobacco Use: Never    Passive Exposure: Not on Actuary Strain: Not on file  Food Insecurity: Not on file  Transportation Needs: Not on file  Physical Activity: Not on file  Stress: Not on file  Social Connections: Unknown (04/05/2022)   Received from  [facility]   Social Network    Social Network: Not on file  Intimate Partner Violence: Unknown (  02/25/2022)   Received from [facility]   HITS    Physically Hurt: Not on file    Insult or Talk Down To: Not on file    Threaten Physical Harm: Not on file    Scream or Curse: Not on file  Depression (PHQ2-9): High Risk (02/11/2024)   Depression (PHQ2-9)    PHQ-2 Score: 15  Alcohol Screen: Not on file  Housing: Not on file  Utilities: Not on file  Health Literacy: Not on file    Physical Exam: Vital signs in last 24 hours: @BP  130/63   Pulse 74   Temp (!) 97.4 F (36.3 C) (Temporal)   Resp 17   Ht 5' 1 (1.549 m)   Wt 72.6 kg   SpO2 100%   BMI 30.23 kg/m  GEN: NAD EYE: Sclerae anicteric ENT: MMM CV: Non-tachycardic Pulm: CTA b/l GI: Soft, NT/ND NEURO:  Alert & Oriented x 3   Sandor Flatter, DO [facility]   11/21/2024 7:21 AM  "

## 2024-11-21 NOTE — Op Note (Signed)
 [facility] Patient Name: Kayla Sanders Procedure Date: 11/21/2024 MRN: [id number] Attending MD: Sandor Flatter , MD, [id number] Date of Birth: 1961/03/22 CSN: [id number] Age: 63 Admit Type: Outpatient Procedure:                Upper GI endoscopy Indications:              Heartburn, Esophageal reflux, Abdominal bloating,                            Dyspepsia Providers:                Sandor Flatter, MD, Jacquelyn Jaci Pierce, RN,                            Corene Southgate, Technician Referring MD:              Medicines:                Monitored Anesthesia Care Complications:            No immediate complications. Estimated Blood Loss:     Estimated blood loss was minimal. Procedure:                Pre-Anesthesia Assessment:                           - Prior to the procedure, a History and Physical                            was performed, and patient medications and                            allergies were reviewed. The patient's tolerance of                            previous anesthesia was also reviewed. The risks                            and benefits of the procedure and the sedation                            options and risks were discussed with the patient.                            All questions were answered, and informed consent                            was obtained. Prior Anticoagulants: The patient has                            taken no anticoagulant or antiplatelet agents. ASA                            Grade Assessment: III - A patient with severe                            systemic  disease. After reviewing the risks and                            benefits, the patient was deemed in satisfactory                            condition to undergo the procedure.                           After obtaining informed consent, the endoscope was                            passed under direct vision. Throughout the                            procedure, the  patient's blood pressure, pulse, and                            oxygen saturations were monitored continuously. The                            [id number]-H190 ([id number]) Olympus endoscope was introduced                            through the mouth, and advanced to the second part                            of duodenum. The upper GI endoscopy was                            accomplished without difficulty. The patient                            tolerated the procedure well. Scope In: Scope Out: Findings:      The examined esophagus was normal.      The Z-line was regular and was found 36 cm from the incisors.      The gastroesophageal flap valve was visualized endoscopically and       classified as Hill Grade II (fold present, opens with respiration).      Multiple small sessile polyps with no bleeding and no stigmata of recent       bleeding were found in the gastric fundus and in the gastric body.       Several of these polyps were removed with a cold biopsy forceps for       histologic representative evaluation. Resection and retrieval were       complete. Estimated blood loss was minimal.      Normal mucosa was found in the entire examined stomach. Biopsies were       taken with a cold forceps for Helicobacter pylori testing. Estimated       blood loss was minimal.      The examined duodenum was normal. Biopsies were taken with a cold       forceps for histology. Estimated blood loss was minimal. Impression:               - Normal  esophagus.                           - Z-line regular, 36 cm from the incisors.                           - Gastroesophageal flap valve classified as Hill                            Grade II (fold present, opens with respiration).                           - Multiple gastric polyps. Resected and retrieved.                           - Normal mucosa was found in the entire stomach.                            Biopsied.                           - Normal examined  duodenum. Biopsied. Moderate Sedation:      Not Applicable - Patient had care per Anesthesia. Recommendation:           - Patient has a contact number available for                            emergencies. The signs and symptoms of potential                            delayed complications were discussed with the                            patient. Return to normal activities tomorrow.                            Written discharge instructions were provided to the                            patient.                           - Resume previous diet.                           - Continue present medications.                           - Await pathology results.                           - If continued reflux symptoms, consider either                            changing Dexilant  to Voquenza or addition of  venlafaxine.                           - Return to GI clinic PRN. Procedure Code(s):        --- Professional ---                           [id number], Esophagogastroduodenoscopy, flexible,                            transoral; with biopsy, single or multiple Diagnosis Code(s):        --- Professional ---                           K31.7, Polyp of stomach and duodenum                           R12, Heartburn                           K21.9, Gastro-esophageal reflux disease without                            esophagitis                           R14.0, Abdominal distension (gaseous)                           R10.13, Epigastric pain CPT copyright 2022 American Medical Association. All rights reserved. The codes documented in this report are preliminary and upon coder review may  be revised to meet current compliance requirements. Sandor Flatter, MD 11/21/2024 7:57:05 AM Number of Addenda: 0

## 2024-11-21 NOTE — Anesthesia Postprocedure Evaluation (Signed)
"   Anesthesia Post Note  Patient: Kayla Sanders  Procedure(s) Performed: EGD (ESOPHAGOGASTRODUODENOSCOPY) BIOPSY, SKIN, SUBCUTANEOUS TISSUE, OR MUCOUS MEMBRANE     Patient location during evaluation: Endoscopy Anesthesia Type: MAC Level of consciousness: awake and alert Pain management: pain level controlled Vital Signs Assessment: post-procedure vital signs reviewed and stable Respiratory status: spontaneous breathing, nonlabored ventilation, respiratory function stable and patient connected to nasal cannula oxygen Cardiovascular status: stable and blood pressure returned to baseline Postop Assessment: no apparent nausea or vomiting Anesthetic complications: no   No notable events documented.  Last Vitals:  Vitals:   11/21/24 0800 11/21/24 0810  BP: (!) 116/58 (!) 126/57  Pulse: 71 65  Resp: 18 13  Temp:    SpO2: 96% 98%    Last Pain:  Vitals:   11/21/24 0810  TempSrc:   PainSc: 0-No pain                 Kayla Sanders      "

## 2024-11-21 NOTE — Discharge Instructions (Signed)

## 2024-11-21 NOTE — Transfer of Care (Signed)
 Immediate Anesthesia Transfer of Care Note  Patient: Kayla Sanders  Procedure(s) Performed: EGD (ESOPHAGOGASTRODUODENOSCOPY) BIOPSY, SKIN, SUBCUTANEOUS TISSUE, OR MUCOUS MEMBRANE  Patient Location: PACU and Endoscopy Unit  Anesthesia Type:MAC  Level of Consciousness: awake and alert   Airway & Oxygen Therapy: Patient Spontanous Breathing and Patient connected to face mask oxygen  Post-op Assessment: Report given to RN and Post -op Vital signs reviewed and stable  Post vital signs: Reviewed and stable  Last Vitals:  Vitals Value Taken Time  BP 110/42 11/21/24 07:50  Temp 36.2 C 11/21/24 07:50  Pulse 67 11/21/24 07:52  Resp 19 11/21/24 07:52  SpO2 98 % 11/21/24 07:52  Vitals shown include unfiled device data.  Last Pain:  Vitals:   11/21/24 0750  TempSrc: Temporal  PainSc:          Complications: No notable events documented.

## 2024-11-21 NOTE — Anesthesia Procedure Notes (Signed)
 Procedure Name: MAC Date/Time: 11/21/2024 7:30 AM  Performed by: Areatha Glendia HERO, CRNAPre-anesthesia Checklist: Patient identified, Emergency Drugs available, Suction available and Timeout performed Patient Re-evaluated:Patient Re-evaluated prior to induction Oxygen Delivery Method: Simple face mask Preoxygenation: Pre-oxygenation with 100% oxygen Induction Type: IV induction

## 2024-11-23 ENCOUNTER — Encounter (HOSPITAL_COMMUNITY): Payer: Self-pay | Admitting: Gastroenterology

## 2024-11-23 LAB — SURGICAL PATHOLOGY

## 2024-11-24 ENCOUNTER — Ambulatory Visit: Payer: Self-pay | Admitting: Gastroenterology

## 2024-12-04 ENCOUNTER — Ambulatory Visit: Admitting: Cardiology

## 2024-12-19 ENCOUNTER — Encounter: Payer: Self-pay | Admitting: Physical Medicine & Rehabilitation

## 2024-12-20 MED ORDER — SUZETRIGINE 50 MG PO TABS: 50.0000 mg | ORAL_TABLET | Freq: Two times a day (BID) | ORAL | 0 refills | Status: AC | PRN

## 2025-02-06 ENCOUNTER — Ambulatory Visit: Admitting: Internal Medicine

## 2025-03-13 ENCOUNTER — Ambulatory Visit: Admitting: Adult Health

## 2025-04-09 ENCOUNTER — Encounter: Admitting: Physical Medicine & Rehabilitation

## 2025-04-10 ENCOUNTER — Ambulatory Visit: Admitting: Neurology
